# Patient Record
Sex: Male | Born: 1955 | Race: White | Hispanic: No | Marital: Single | Smoking: Never smoker
Health system: Southern US, Community
[De-identification: ages and names within clinical notes are randomized; demographics above are authoritative.]

## PROBLEM LIST (undated history)

## (undated) DIAGNOSIS — I519 Heart disease, unspecified: Secondary | ICD-10-CM

## (undated) DIAGNOSIS — I2510 Atherosclerotic heart disease of native coronary artery without angina pectoris: Secondary | ICD-10-CM

## (undated) DIAGNOSIS — G4730 Sleep apnea, unspecified: Secondary | ICD-10-CM

## (undated) DIAGNOSIS — I6521 Occlusion and stenosis of right carotid artery: Secondary | ICD-10-CM

## (undated) DIAGNOSIS — R011 Cardiac murmur, unspecified: Secondary | ICD-10-CM

## (undated) DIAGNOSIS — G931 Anoxic brain damage, not elsewhere classified: Secondary | ICD-10-CM

## (undated) DIAGNOSIS — Z95810 Presence of automatic (implantable) cardiac defibrillator: Secondary | ICD-10-CM

## (undated) DIAGNOSIS — E785 Hyperlipidemia, unspecified: Secondary | ICD-10-CM

## (undated) DIAGNOSIS — T82198A Other mechanical complication of other cardiac electronic device, initial encounter: Secondary | ICD-10-CM

## (undated) DIAGNOSIS — E039 Hypothyroidism, unspecified: Secondary | ICD-10-CM

## (undated) DIAGNOSIS — Z789 Other specified health status: Secondary | ICD-10-CM

## (undated) DIAGNOSIS — I255 Ischemic cardiomyopathy: Secondary | ICD-10-CM

## (undated) DIAGNOSIS — H9190 Unspecified hearing loss, unspecified ear: Secondary | ICD-10-CM

## (undated) DIAGNOSIS — I214 Non-ST elevation (NSTEMI) myocardial infarction: Secondary | ICD-10-CM

## (undated) DIAGNOSIS — I509 Heart failure, unspecified: Secondary | ICD-10-CM

## (undated) HISTORY — DX: Presence of automatic (implantable) cardiac defibrillator: Z95.810

## (undated) HISTORY — DX: Hyperlipidemia, unspecified: E78.5

## (undated) HISTORY — PX: TONSILLECTOMY: SUR1361

## (undated) HISTORY — DX: Hypothyroidism, unspecified: E03.9

## (undated) HISTORY — PX: CORONARY ARTERY BYPASS GRAFT: SHX141

## (undated) HISTORY — PX: ADENOIDECTOMY: SUR15

## (undated) HISTORY — DX: Heart disease, unspecified: I51.9

## (undated) HISTORY — DX: Other mechanical complication of other cardiac electronic device, initial encounter: T82.198A

## (undated) HISTORY — DX: Anoxic brain damage, not elsewhere classified: G93.1

## (undated) HISTORY — DX: Non-ST elevation (NSTEMI) myocardial infarction: I21.4

## (undated) HISTORY — DX: Occlusion and stenosis of right carotid artery: I65.21

## (undated) HISTORY — PX: CARDIAC DEFIBRILLATOR PLACEMENT: SHX171

## (undated) HISTORY — DX: Sleep apnea, unspecified: G47.30

## (undated) HISTORY — DX: Ischemic cardiomyopathy: I25.5

## (undated) HISTORY — DX: Other specified health status: Z78.9

---

## 2000-07-18 DIAGNOSIS — I214 Non-ST elevation (NSTEMI) myocardial infarction: Secondary | ICD-10-CM

## 2000-07-18 HISTORY — DX: Non-ST elevation (NSTEMI) myocardial infarction: I21.4

## 2000-08-05 ENCOUNTER — Inpatient Hospital Stay (HOSPITAL_COMMUNITY): Admission: EM | Admit: 2000-08-05 | Discharge: 2000-08-24 | Payer: Self-pay | Admitting: Emergency Medicine

## 2000-08-05 ENCOUNTER — Encounter: Payer: Self-pay | Admitting: Emergency Medicine

## 2000-08-05 HISTORY — PX: CARDIAC CATHETERIZATION: SHX172

## 2000-08-09 ENCOUNTER — Encounter: Payer: Self-pay | Admitting: Cardiology

## 2000-08-11 ENCOUNTER — Encounter: Payer: Self-pay | Admitting: Cardiology

## 2000-08-12 ENCOUNTER — Encounter: Payer: Self-pay | Admitting: Thoracic Surgery (Cardiothoracic Vascular Surgery)

## 2000-08-13 ENCOUNTER — Encounter: Payer: Self-pay | Admitting: Thoracic Surgery (Cardiothoracic Vascular Surgery)

## 2000-08-14 ENCOUNTER — Encounter: Payer: Self-pay | Admitting: Thoracic Surgery (Cardiothoracic Vascular Surgery)

## 2000-08-15 ENCOUNTER — Encounter: Payer: Self-pay | Admitting: Thoracic Surgery (Cardiothoracic Vascular Surgery)

## 2000-08-16 ENCOUNTER — Encounter: Payer: Self-pay | Admitting: Thoracic Surgery (Cardiothoracic Vascular Surgery)

## 2000-08-17 ENCOUNTER — Encounter: Payer: Self-pay | Admitting: Thoracic Surgery (Cardiothoracic Vascular Surgery)

## 2000-08-18 ENCOUNTER — Encounter: Payer: Self-pay | Admitting: Thoracic Surgery (Cardiothoracic Vascular Surgery)

## 2000-08-19 ENCOUNTER — Encounter: Payer: Self-pay | Admitting: Thoracic Surgery (Cardiothoracic Vascular Surgery)

## 2000-08-21 ENCOUNTER — Encounter: Payer: Self-pay | Admitting: Cardiothoracic Surgery

## 2000-08-24 ENCOUNTER — Inpatient Hospital Stay: Admission: RE | Admit: 2000-08-24 | Discharge: 2000-08-31 | Payer: Self-pay | Admitting: Internal Medicine

## 2003-09-19 ENCOUNTER — Ambulatory Visit (HOSPITAL_COMMUNITY): Admission: RE | Admit: 2003-09-19 | Discharge: 2003-09-19 | Payer: Self-pay | Admitting: Cardiology

## 2004-10-21 ENCOUNTER — Ambulatory Visit: Payer: Self-pay | Admitting: Internal Medicine

## 2004-12-09 ENCOUNTER — Ambulatory Visit: Payer: Self-pay | Admitting: Internal Medicine

## 2004-12-10 ENCOUNTER — Inpatient Hospital Stay (HOSPITAL_COMMUNITY): Admission: RE | Admit: 2004-12-10 | Discharge: 2004-12-11 | Payer: Self-pay | Admitting: Internal Medicine

## 2004-12-28 ENCOUNTER — Ambulatory Visit: Payer: Self-pay | Admitting: Internal Medicine

## 2004-12-28 ENCOUNTER — Ambulatory Visit: Payer: Self-pay

## 2010-09-28 ENCOUNTER — Encounter: Payer: Self-pay | Admitting: Internal Medicine

## 2010-11-02 ENCOUNTER — Ambulatory Visit: Payer: Self-pay | Admitting: Cardiology

## 2010-11-19 NOTE — Miscellaneous (Signed)
Summary: Device preload  Clinical Lists Changes  Observations: Added new observation of ICDEXPLCOMM: 12/29/07 LIA (09/28/2010 19:19) Added new observation of ICD INDICATN: ICM (09/28/2010 19:19) Added new observation of ICDLEADSTAT1: active (09/28/2010 19:19) Added new observation of ICDLEADSER1: [id number] V (09/28/2010 19:19) Added new observation of ICDLEADMOD1: 9629  (09/28/2010 19:19) Added new observation of ICDLEADDOI1: 12/10/2004  (09/28/2010 19:19) Added new observation of ICDLEADLOC1: RV  (09/28/2010 19:19) Added new observation of ICD IMP MD: Sherryl Manges, MD  (09/28/2010 19:19) Added new observation of ICD IMPL DTE: 12/10/2004  (09/28/2010 19:19) Added new observation of ICD SERL#: [id number] H  (09/28/2010 19:19) Added new observation of ICD MODL#: 7232  (09/28/2010 19:19) Added new observation of ICDMANUFACTR: Medtronic  (09/28/2010 19:19) Added new observation of ICD MD: Sherryl Manges, MD  (09/28/2010 19:19)       ICD Specifications Following MD:  Sherryl Manges, MD     ICD Vendor:  Medtronic     ICD Model Number:  7232     ICD Serial Number:  [id number] ICD DOI:  12/10/2004     ICD Implanting MD:  Sherryl Manges, MD  Lead 1:    Location: RV     DOI: 12/10/2004     Model #: 6440     Serial #: [id number]     Status: active  Indications::  ICM  Explantation Comments: 12/29/07 LIA

## 2011-02-02 ENCOUNTER — Encounter: Payer: Self-pay | Admitting: Internal Medicine

## 2011-02-04 ENCOUNTER — Encounter: Payer: Self-pay | Admitting: Internal Medicine

## 2011-02-10 ENCOUNTER — Encounter: Payer: Self-pay | Admitting: Internal Medicine

## 2011-03-05 NOTE — Procedures (Signed)
[place]. [facility]  Patient:    Adam Gillespie, Adam Gillespie                        MRN: [id number] Proc. Date: 08/12/00 Adm. Date:  [id number] Attending:  Charlett Lango                           Procedure Report  PROCEDURE:  Intraoperative transesophageal echocardiography.  INDICATIONS:  Mr. Yaron Grasse is a 55 year old white male with a history of severe coronary artery disease and severe left ventricular dysfunction.  He is scheduled for coronary artery bypass grafting.  Intraoperative transesophageal echocardiography was requested to evaluate the extent of left ventricular dysfunction and to determine if any valvular pathology was present, and to serve as a monitor of intraoperative volume status.  DESCRIPTION:  The patient was brought to the operating room at [facility] and general anesthesia was induced without difficulty.  The transesophageal echocardiography probe was then inserted into the esophagus without difficulty.  IMPRESSION - PREBYPASS FINDINGS: 1. Severe left ventricular dysfunction.  The left ventricle was dilated and    there was dyskinesis of the apex; akinesis of the anterior wall; mild    dyskinesis of the septum; with hypokinesis of the inferior, posterior,    and lateral walls.  Ejection fraction was estimated at 15-20%.  There    was no thrombus noted in the left ventricular apex. 2. Mitral valve function appeared normal.  There was trace mitral    insufficiency.  The leaflets were mildly thickened, but coapted well    without prolapse or fluttering. 3. Aortic valve leaflets were thickened but otherwise normal.  There was good    opening and no aortic insufficiency noted.  The valve was trileaflet. 4. Left atrium appeared normal in size.  There was no spontaneous echo    contrast and no thrombus noted in the left atrium or left atrial    appendage. 5. Interatrial septum was intact. 6. Right ventricular size was normal with  normal-appearing right ventricular    function. 7. Tricuspid valve appeared normal with trace tricuspid insufficiency.  POST BYPASS FINDINGS: 1. Persistent severe left ventricular dysfunction.  The ejection fraction    appeared somewhat improved due to the fact that an intra-aortic balloon    pump was in place.  However, again there was dyskinesis of the    interventricular septum.  There was dyskinesis of the left ventricular    apex.  There was akinesis of the anterior wall and there was hypokinesis    of the inferior, posterior, and lateral walls.  Again, ejection fraction    was estimated at 20%. 2. The mitral valve, again, appeared unchanged from the prebypass study.    There was trace mitral insufficiency.  The mitral valve appeared normal. 3. Aortic valve leaflets were moderately thickened but aortic valve function,    again, appeared normal. DD:  08/12/00 TD:  08/13/00 Job: [id number]
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
## 2011-03-05 NOTE — Cardiovascular Report (Signed)
[place]. [facility]  Patient:    Adam Gillespie, Adam Gillespie                        MRN: [id number] Proc. Date: 08/05/00 Adm. Date:  [id number] Attending:  Eleanora Neighbor CC:         Hadassah Pais. Jeannetta Nap, M.D.   Cardiac Catheterization  PROCEDURES PERFORMED:  Left heart catheterization with selective coronary angiography and left ventricular angiography.  TYPE AND SITE OF ENTRY:  Percutaneous, right femoral artery.  CATHETERS:  Six [place] #4 curved Judkins right coronary catheter, #3.5 curved Judkins left coronary catheter, and 6-French pigtail ventriculography catheter.  CONTRAST MATERIAL:  Omnipaque.  MEDICATION GIVEN PRIOR TO PROCEDURE:  IV nitroglycerin and aspirin.  MEDICATION GIVEN DURING PROCEDURE:  None.  COMMENTS:  The patient tolerated the procedure well.  HEMODYNAMIC DATA:  The aortic pressure was 119/70, LV pressure 114/35.  There was no aortic valve gradient noted on pullback.  ANGIOGRAPHIC DATA:  LEFT VENTRICULOGRAPHY:  The left ventricular angiogram was performed in the RAO position.  There is anterior and apical akinesis.  The global ejection fraction is estimated to be 20%, with inferobasilar and anterobasilar portions contracting reasonably well.  There does not appear to be any significant mitral regurgitation.  CORONARY ARTERIES:  The coronary arteries arise and distribute normally, but the right coronary artery is small and in the left system there is an early bifurcation.  There are basically four major branches off of the left main.  1.  Right coronary artery:  The right coronary artery is small.  It is a     dominant vessel.  It has an 80% narrowing and there is catheter damping.     The arteries are clearly less than 2 mm in diameter and probably     approximately 1.5 mm in their more proximal locations.  The distal vessels     would be questionable for bypass grafting. 2.  Left main coronary:  There is catheter damping with  entering the left     main. 3.  Left circumflex:  The left circumflex continues along the AV groove.     There is 60% to 70% proximal along the initial portions of this. 4.  Intermediate vessel:  There is an intermediate vessel that continues to     the posterolateral wall and inferolateral wall.  There is approximately     70% narrowing in this proximal portion of this vessel.  This is the     largest of the vessels, particularly distally. 5.  Left anterior descending artery:  The left anterior descending artery is     subtotaled proximally.  It continues as a bifurcating branch.  It probably     would be suitable for bypass grafting, but it has very slow antegrade     flow. 6.  First diagonal branch:  The first diagonal branch arises in a very high     location.  There is a 99% proximal stenosis and very slow antegrade flow.     There is diffuse disease throughout this vessel, but the vessel probably     would be suitable for bypass grafting.  It is diffusely diseased distally.  OVERALL IMPRESSION: 1.  Severe left ventricular dysfunction. 2.  Severe three-vessel coronary artery disease with 80% small right coronary     artery, 99% left anterior descending artery, 99% first diagonal with     diffuse distal disease, 70%+ intermediate, 60% small left  circumflex in AV     groove with a small left main with catheter damping.  DISCUSSION:  He will initially be treated with IV nitroglycerin.  With the slow flow and with the GI bleeding, he is a poor candidate for anticoagulation at this point in time.  We will need to consider him as a candidate for coronary artery bypass grafting, although he is a poor candidate quite frankly for this as well.  He is absolutely not a candidate for percutaneous intervention.  He remains at high risk. DD:  08/05/00 TD:  08/06/00 Job: [id number]

## 2011-03-05 NOTE — Discharge Summary (Signed)
[place]. [facility]  Patient:    Adam Gillespie, Adam Gillespie                        MRN: [id number] Adm. Date:  [id number] Disc. Date: [id number] Attending:  Miguel Aschoff CC:         Hadassah Pais. Jeannetta Nap, M.D.  Colleen Can. Deborah Chalk, M.D.  Salvatore Decent Dorris Fetch, M.D.   Discharge Summary  DATE OF BIRTH:  1955/12/10  DISCHARGE DIAGNOSES:  1. Atherosclerotic coronary artery disease.     a. Ejection fraction 20%.     b. Acute myocardial infarction August 05, 2000.     c. Coronary artery bypass (x 5), August 12, 2000.     d. Profound deconditioning requiring subacute care unit stay.  2. Hyperkalemia on ACE inhibitor.     a. Medication discontinued.  3. Mild renal insufficiency, stable.     a. Discharge BUN 29, creatinine 1.1.  4. Melena on previous acute hospital admission.     a. Required transfusion.     b. Hemoglobin stable.     c. Hemoglobin remains stable.     d. Follow up as an outpatient and consider workup.  5. Mild oropharyngeal dysphagia.     a. Diet advanced to regular with thin liquids this admission.  6. Mild encephalopathy secondary to #1.     a. CT and EEG unremarkable.  7. Hypothyroidism.     a. TSH October 2001 was 12, medication increased.  8. Peripheral vascular disease.     a. 60-80% right internal carotid artery stenosis.  9. Hyperlipidemia. 10. Acute postoperative anemia.     a. Admission hemoglobin 11.2, low of 7, 6 units transfused.     b. All this occurred on acute hospitalization for myocardial infarction.  DISCHARGE MEDICATIONS:  1. Kayexalate 15 g onetime dose.  2. Aspirin 81 mg q.d.  3. Protonix 40 mg q.d.  4. Lipitor 10 mg q.d.  5. Niferex 150 mg p.o. b.i.d.  6. Synthroid 150 mcg q.d.  7. Lasix 20 mg q.d.  8. Lopressor liquid 12.5 mg equals 5 mL b.i.d.  9. Tylenol as needed for pain. 10. Colace as needed for constipation.  ACTIVITY:  As tolerated.  DIET:  Low salt.  Drink plenty of water.  SPECIAL  INSTRUCTIONS:  1. Weigh yourself daily.  If gain or lose more than 5 pounds from the first     home weight, call Dr. Deborah Chalk.  2. Home health will draw basic metabolic panel on Monday with the results to     Dr. Jeannetta Nap.  Physical therapy will also see the patient at home.  FOLLOW-UP:  1. Dr. Jeannetta Nap on Monday, December 10, at 8:30.  At that visit, he will need a     basic metabolic panel, as well as a TSH, checked.  2. The patient is to call Dr. Angelina Pih office to schedule a follow-up in     approximately 3-4 weeks.  CONSULTATIONS:  1. Dr. Deborah Chalk.  2. Dr. Dorris Fetch.  PROCEDURES:  Bedside swallowing evaluation (November 8):  Dysphagia 3 with thin liquids and supervision for chin tuck.  HOSPITAL COURSE: #1 - CORONARY ARTERY DISEASE:  Adam Gillespie suffered an acute non-Q wave MI, followed by bypass surgery last month.  He is stable from a cardiac standpoint.  He has a low ejection fraction but no evidence of congestive heart failure.  We did have to stop his ACE inhibitor secondary to hyperkalemia.  Discharge weight  127.8 pounds.  #2 - HYPERKALEMIA:  Induced by ACE inhibitor.  We did discontinue this medication prior to discharge.  His potassium peaked at 5.3.  We stopped his ACE inhibitor for 24 hours and it decreased to 5.2.  I did give him 15 g of Kayexalate prior to discharge and he will have a basic metabolic panel repeated on November 19.  I suspect it will continue to trend downward.  #3 - MILD RENAL INSUFFICIENCY:  Adam Gillespie BUN and creatinine actually improved during this hospital stay.  His discharge BUN was 29 with a creatinine of 1.2.  Follow up as an outpatient.  #4 - HYPOTHYROIDISM:  The patients TSH was elevated at 12 and his Synthroid was increased.  Repeat in one month per Dr. Jeannetta Nap.  #5 - REACTIVE THROMBOCYTOPENIA:  Stable.  #6 - ANEMIA:  Adam Gillespie did have postoperative hemoglobin drop during his acute stay with bypass surgery.  He is on iron therapy  and his discharge hemoglobin was 12.5.  #7 - MILD ENCEPHALOPATHY:  Adam Gillespie has somewhat slowed mentation but is functioning without difficulty.  CT and an EEG done during acute hospitalization were unremarkable. DD:  08/31/00 TD:  08/31/00 Job: [id number]

## 2011-03-05 NOTE — Op Note (Signed)
NAMEOWAIS, Adam Gillespie NO.:  [id number]   MEDICAL RECORD NO.:  [id number]          PATIENT TYPE:  INP   LOCATION:  2899                         FACILITY:  [facility]   PHYSICIAN:  Duke Salvia, M.D.  DATE OF BIRTH:  25-Aug-1956   DATE OF PROCEDURE:  12/10/2004  DATE OF DISCHARGE:                                 OPERATIVE REPORT   PREOPERATIVE DIAGNOSIS:  Ischemic cardiomyopathy with depressed left  ventricular function, broad QRS.   POSTOPERATIVE DIAGNOSIS:  Ischemic cardiomyopathy with depressed left  ventricular function, broad QRS.   PROCEDURE:  Single chamber defibrillator implantation.   Following obtaining informed consent, the patient was brought to the  electrophysiology laboratory and placed on the fluoroscopy table in supine  position.  After routine prep and drape of the left upper chest, lidocaine  was infiltrated in the prepectoral subclavicular region.  An incision was  made and carried down to the layer of the prepectoral fascia using  electrocautery and sharp dissection.  A pocket was performed similarly,  hemostasis was obtained.   Next, attention was turned to gaining access to the extrathoracic left  subclavian vein which was accomplished without difficulty without the  aspiration of air or puncture of the artery.  A guide-wire was placed and  retained and a 7 French sheath was placed through which was then passed a  Medtronic 6949, 65 cm, dual coil active fixation defibrillator lead, serial  number [id number].  Under fluoroscopic guidance, it was manipulated to the  right ventricular apex where it failed to sense adequately.  We actually put  hockey curve into it to put it on the base of the septum. In this location,  the bipolar R wave was 13.9 millivolts with pacing impedance of 1239 ohms  and a threshold of 1 volt at 0.5 milliseconds, current threshold was 1 MA,  there was no diaphragmatic pacing at 10 volts.  The lead was secured to the  prepectoral fascia and attached to a Medtronic Maximow 7232CX ICD, serial  number [id number].  Through the device, the bipolar R wave was 11 millivolts  with a pacing impedance of 960 ohms and threshold of 1  volts at 0.4  milliseconds, the distal coil impedance was 49 ohms, the proximal coil  impedance was 78 ohms.   With these acceptable parameters recorded, defibrillation threshold testing  was undertaken.  Ventricular defibrillation was induced via the T wave  shock.  After a total duration of 6 seconds, a 15 joule shock was delivered  through a measured resistance of 52 ohms terminating ventricular  fibrillation restoring a previously paced rhythm.  After a wait of 5-6  minutes, ventricular fibrillation was reinduced via the T wave shock.  After  a total duration of 6 seconds, a 15.1 joule shock was delivered through a  measured resistance of 51 ohms terminating ventricular fibrillation and  restoring sinus rhythm.   With these acceptable parameters recorded, this system was implanted.  The  pocket was copiously irrigated with antibiotic containing saline solution.  Hemostasis was assured.  The lead and pulse generator were placed in the  pocket and secured to the prepectoral fascia.  The wound was washed, dried,  and a Benzoin and Steri-Strip dressing was applied.  Needle counts, sponge  counts, and instrument counts were correct at the end of the procedure  according to the staff.  The patient tolerated the procedure without  apparent complications.      SCK/MEDQ  D:  12/10/2004  T:  12/10/2004  Job:  [id number]   cc:   Colleen Can. Deborah Chalk, M.D.  Fax: [phone]
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
## 2011-03-05 NOTE — H&P (Signed)
[place]. [facility]  Patient:    Adam Gillespie, Adam Gillespie                        MRN: [id number] Adm. Date:  [id number] Attending:  Miguel Gillespie CC:         Adam Gillespie. Adam Gillespie, M.D.  Salvatore Decent Dorris Fetch, M.D.  Catherine A. Orlin Hilding, M.D.  Colleen Can. Deborah Chalk, M.D.   History and Physical  DATE OF BIRTH: September 28, 1956  PROBLEM LIST:  1. Atherosclerotic coronary artery disease with severe left ventricular     dysfunction, ejection fraction 20%.     a. Acute myocardial infarction August 05, 2000.     b. Coronary artery bypass grafting x 5 vessels on August 12, 2000.  2. Melena on admission.  3. Deconditioning after prolonged hospitalization.  4. Mild oropharyngeal dysphagia, evaluated July 29, 2000.  5. Mild acute encephalopathy, evaluated August 17, 2000.     a. CT and electroencephalogram unremarkable.  6. Hypothyroidism.  7. Ethmoid, sphenoid sinusitis; asymptomatic; incidental finding by CT.  8. Atherosclerotic peripheral vascular disease, with 60-80% right internal     carotid artery stenosis.  9. Hyperlipidemia, treated. 10. Acute postoperative anemia (hemoglobin 12.2 on admission, 7.0 nadir; six     unit transfusion).  HISTORY OF PRESENT ILLNESS: The patient is a 55 year old man with a strong family history of coronary artery disease, admitted August 05, 2000 to the service of Dr. Roger Gillespie with complaint of chest pain.  He was found to have suffered an acute myocardial infarction, and further investigation revealed that he had multi-vessel disease and marked cardiomyopathy, with an ejection fraction of 20%, requiring CABG.  Adam Gillespie underwent this procedure on August 12, 2000 and experienced a prolonged hospital stay, partly contributed to by delirium and somnolence, which gradually resolved and was felt to be multifactorial.  Adam Gillespie has subsequently done well, and is now prepared for transfer to the subacute care unit  where he will undergo continued restorative therapies with the hope of returning home to his independent environment.  He currently feels well and is ready to go home.  He denies headache, chest pain, shortness of breath, abdominal pain, indigestion, alteration in appetite, limb and joint pain, hold and cold intolerance, but does have a tendency to constipation.  He has not noted melena of late.  FAMILY HISTORY: Strong for coronary disease.  SOCIAL HISTORY: The patient is single and lives next door to his mother in [place].  He is employed at a [employer].  He ceased tobacco smoking many years ago.  He does not drink alcohol.  He apparently suffered a head injury some years ago, and may carry a diagnosis of mild MR.  PAST MEDICAL HISTORY: As above.  TRANSFER MEDICATIONS:  1. Enteric-coated aspirin 81 mg q.d.  2. Lasix 40 mg q.d.  3. Protonix 40 mg q.d.  4. Synthroid 125 mcg q.d.  5. Altace 2.5 mg b.i.d.  6. Lipitor 10 mg q.d.  7. Niferex 150 mg b.i.d.  ALLERGIES: No known drug allergies.  PHYSICAL EXAMINATION:  VITAL SIGNS: Vital signs are pending at the time of discharge.  GENERAL: He appears to be well-developed and well-nourished.  He is somewhat disheveled.  HEENT: Eyes express somewhat of a stare and lid lag, left greater than right. He exhibits some palpebra fluttering.  Pupils are 2 mm and reactive.  There appears to be a mild bit of strabismus.  Peripheral fields are  intact by confrontation.  Oropharynx appears moist.  NECK: There is mild thyroid fullness but no nodularity.  There is no JVD and no carotid bruits by auscultation, although some environmental noise interferes with careful examination.  LUNGS: Clear to auscultation.  HEART: Regular rate and rhythm.  ABDOMEN: Soft, nontender.  No palpable masses.  EXTREMITIES: Lower extremities are hairless.  The right lower extremity is notable for a healing venous harvest site.  The  feet are warm.  Nail beds appear pale.  LABORATORY DATA: Hemoglobin 12.5.  Sodium 136, potassium 5, chloride 101, bicarbonate 26, BUN 47, creatinine 1.5, glucose 98.  TSH 12.  ASSESSMENT/PLAN:  1. Post coronary artery bypass graft deconditioning.  Social workers have     met with Adam Gillespie and I agree with their assessment that he will     benefit from further observation in restorative care to include     education.  He does not read, and conversation directed education will     be important.  2. Atherosclerotic coronary artery disease.  Will continue to observe for     toleration of ACE inhibitor.  Should blood pressure tolerate in the     future it would be desirable to begin beta-blocker.  3. Reported melena on admission.  Hemoglobin has been stable following     transfusion.  If further evidence of recurrence this will need     gastrointestinal work-up.  Will heme check stools.  4. Insufficiently treated hypothyroidism.  Will increase Synthroid by     25 mcg q.d.  5. Mild prerenal insufficiency secondary to decreased fluid intake (the     patient is currently on thick liquids).  Will need to encourage increased     oral intake. DD:  08/24/00 TD:  08/25/00 Job: [id number]

## 2011-03-05 NOTE — Discharge Summary (Signed)
NAMEWESLEY, DUCRE NO.:  [id number]   MEDICAL RECORD NO.:  [id number]          PATIENT TYPE:  INP   LOCATION:  3705                         FACILITY:  [facility]   PHYSICIAN:  Duke Salvia, M.D.  DATE OF BIRTH:  12-05-1955   DATE OF ADMISSION:  12/10/2004  DATE OF DISCHARGE:  12/11/2004                                 DISCHARGE SUMMARY   DISCHARGE DIAGNOSES:  1.  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 Sherryl Manges.  2.  History of acute myocardial infarction October 2001 with subsequent      coronary artery bypass graft surgery x5 in October 2001.  3.  Ischemic cardiomyopathy, ejection fraction 20%.  4.  Wide QRS.   SECONDARY DIAGNOSES:  1.  Hypothyroidism.  2.  Dyslipidemia.  3.  Family history of coronary artery disease.  4.  Extracranial cerebrovascular occlusive disease with right internal      carotid artery stenosis in the range 60-80%.  5.  Gout.   PROCEDURES:  On December 10, 2004 implantation of [employer]tr[employer] [facility]  cardioverter defibrillator with defibrillator threshold testing less than or  equal to 15 joules, Dr. Sherryl Manges practitioner.  The patient has had no  postoperative complications.  Has maintained sinus rhythm.  He is achieving  oxygen saturation of 97% on room air.  He has had no fever and his  cardioverter defibrillator site is healing nicely without swelling or  erythema.   DISCHARGE MEDICATIONS:  1.  Toprol-XL 50 mg daily.  2.  Lipitor 20 mg daily.  3.  Synthroid 150 mcg daily.  4.  Furosemide 20 mg daily.  5.  Enteric-coated aspirin 81 mg daily.  6.  Multivitamin daily.  7.  For pain management at the cardioverter defibrillator pocket, Tylenol      325 mg one to two tablets every four to six hours as needed for pain.   Mobility of the left upper extremity has been described and discussed with  the  patient.   DISCHARGE DIET:  Low sodium, low cholesterol diet.   The patient has been instructed to keep his incision dry for the next seven  days and he is to sponge bathe until Thursday, March 2.   Follow up at [facility], consist of:  ICD clinic Monday, December 28, 2004 at 9:45 in the morning.  He will see [name] in May.  His office will  call with that appointment.   BRIEF HISTORY:  Mr. Crall is a 55 year old male.  He has a complex cardiac  history.  He had myocardial infarction in October 2001 and underwent  coronary artery bypass graft surgery for this.  Postoperative course has  been complicated by encephalopathy.  From a symptomatic point of view, the  patient is doing well and he is not having shortness of breath or chest  pain.  He works vigorously in his garden.  He has rare peripheral edema.  He  has no nocturnal dyspnea or orthopnea.  He had a history of syncope about 30-  40 years ago.  Over the last year, he has had a Cardiolite study in December  2004 and showed an ejection fraction of 22%.  This has been persistent since  bypass surgery.  He has not had an intercurrent ejection fraction  evaluation.  Carotid Dopplers were taken also in December 2004 showing right  internal carotid stenosis of 60-70%.  Medical history is also notable for  gout.  He has had no other surgeries.  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  He works in the garden rather vigorously and is  proud of his efforts.  He does not use tobacco or alcoholic beverages.  The  patient is clearly high risk.  He has qualification for ICD insertion  secondary to MADIT-II criteria.  In the setting of wide QRS, there is also a  possibility that he would benefit from cardiac resynchronization therapy and  whether it might prevent development of heart failure symptoms, although the  patient currently does not have any demonstrable symptoms related to  congestive heart failure.   The patient is to get back with the  electrophysiology service with [name] to indicate what the procedures and  forward progress they would anticipate.   HOSPITAL COURSE:  The patient presents electively February 23 for  cardioverter defibrillator implantation.  Apparently, the patient was not  desirous of participating in the MADIT CRT protocol study and so, therefore,  cardioverter defibrillator only was implanted.  The patient has done well  status post the procedure and goes home with the medications and followup as  dictated.      GM/MEDQ  D:  12/11/2004  T:  12/11/2004  Job:  [id number]   cc:   Colleen Can. Deborah Chalk, M.D.  Fax: [phone]   Windle Guard, M.D.  [address]  [place], [place] [address]  Fax: [phone]

## 2011-03-05 NOTE — H&P (Signed)
NAMEMACLANE, HOLLORAN NO.:  [id number]   MEDICAL RECORD NO.:  [id number]          PATIENT TYPE:  INP   LOCATION:  2899                         FACILITY:  [facility]   PHYSICIAN:  Duke Salvia, M.D.  DATE OF BIRTH:  Jan 16, 1956   DATE OF ADMISSION:  12/10/2004  DATE OF DISCHARGE:                                HISTORY & PHYSICAL   PROCEDURE:  Insertion of ICD by Dr. Sherryl Manges.   PAST MEDICAL HISTORY:  Atherosclerotic coronary artery disease with severe  left ventricular dysfunction, ejection fraction 20%, acute myocardial  infarction August 05, 2000, coronary artery bypass grafting x5 on August 12, 2000, history of mild encephalopathy chronic,  hypothyroidism,  atherosclerotic peripheral vascular disease with 30-80% right internal  carotid artery stenosis, hyperlipidemia.   A 55 year old male with strong family history of coronary artery disease as  stated above, followed by Dr. Berton Mount.  Patient presently alert and  oriented, denies any chest pain, denies any shortness of breath.   PHYSICAL EXAMINATION:  HEENT: Unremarkable, neck veins are flat, negative  carotid bruits.  LUNGS:  Clear.  CARDIOVASCULAR:  Heart sounds S1/S2, regular rate and rhythm with early  systolic murmur.  ABDOMEN:  Soft with bowel sounds.  EXTREMITIES: Distal pulses 2+, no clubbing, cyanosis or edema.  NEUROLOGIC:  Cranial nerves II-XII grossly intact.   LABORATORY DATA:  EKG showing normal sinus rhythm.  Baseline white blood  cell count 9.0, hemoglobin 17, hematocrit 49.5, platelet count 329,000.  PT  12, INR 0.8.  Sodium 136, potassium 5.0, chloride 104, CO2 24, glucose 99,  BUN 19, creatinine 1.4.   PLAN:  Proceed with ICD implantation per Dr. Sherryl Manges.      MB/MEDQ  D:  12/10/2004  T:  12/10/2004  Job:  [id number]

## 2011-03-05 NOTE — Op Note (Signed)
[place]  Patient:    Adam Gillespie, Adam Gillespie                        MRN: [id number] Proc. Date: 08/12/00 Adm. Date:  [id number] Attending:  Charlett Lango CC:         Colleen Can. Deborah Chalk, M.D.   Operative Report  PREOPERATIVE DIAGNOSIS:  Left main and three vessel coronary artery disease, status post MI with severe ischemic cardiomyopathy.  POSTOPERATIVE DIAGNOSIS:  Left main and three vessel coronary artery disease, status post MI with severe ischemic cardiomyopathy.  PROCEDURE:  Median sternotomy, extracorporeal circulation, coronary artery bypass grafting x 5 (LIMA to LAD, saphenuos vein graft to ramus intermedius and obtuse marginal 1, saphenuos vein graft to acute marginal and distal right coronary), placement of intra-aortic balloon pump.  ANESTHESIA:  General anesthesia.  SURGEON:  Salvatore Decent. Dorris Fetch, M.D.  ASSISTANT:  Sherrie George, P.A.  ANESTHESIA:  General anesthesia.  FINDINGS:  Poor quality targets except LAD which was fair.  Left ventricular hypertrophy and dilatation.  Prebypass TEE revealed septal dyskinesis, apical akinesis, and global hypokinesis with an EF of 20%.  Postbypass revealed the same.  Saphenous vein fair quality, mammary artery good quality.  INDICATIONS:  Adam Gillespie is a 55 year old gentleman who presented with complaint of several-day history of chest pain which waxed and waned.  He was felt to have presented late in the course of an acute myocardial infarction. The patient was seen and evaluated by Colleen Can. Deborah Chalk, M.D.  He did rule in for myocardial infarction with enzymes decreasing.  He underwent cardiac catheterization which revealed left main and three vessel coronary artery disease and severe left ventricular dysfunction with an ejection fraction estimated at 20%.  There was no significant mitral regurgitation.  The patient was referred for coronary artery bypass grafting.  The patient was a  high risk candidate for bypass grafting due to his small coronaries, diffuse nature of his coronary artery disease, and severe left ventricular dysfunction.  These issues were discussed in detail with the patient and his family preoperatively. They understood the risks included risk of death, stroke, MI, bleeding, possible need for transfusions, infection, possible need for intra-aortic balloon pump with a risk to limb, ischemia, as well as other organ system dysfunction.  The patient and his family accepted the risks and agreed to proceed.  DESCRIPTION OF PROCEDURE:  [name] was brought to the preoperative holding area on August 12, 2000.  Lines were placed to monitor arterial, central venous, and pulmonary arterial pressure.  EKG leads were placed for continuous telemetry.  The patient was taken to the operating room, anesthetized, and intubated.  Foley catheter was placed.  Intravenous antibiotics were administered.  Transesophageal echocardiography was performed and showed septal dyskinesis, apical akinesis, and global hypokinesis with an ejection fraction estimated at 20%.  There was mild mitral regurgitation.  No other valvular abnormalities.  The chest, abdomen, and legs were then prepped and draped in the usual fashion.  A median sternotomy was performed and the left internal mammary artery was harvested in the standard fashion.  Simultaneously, incision was made in the medial aspect of the right leg and the greater saphenous vein was harvested using standard techniques.  The saphenous vein was of fair quality.  The mammary artery was of good quality.  The patient was fully heparinized prior to dividing the distal end of the mammary artery.  There was good flow through the cut end  of the vessel.  The mammary was placed on papaverin-soaked sponge and placed into the left pleural space.  The pericardium was opened.  There was marked cardiomegaly, biventricular hypertrophy,  and dilatation.  The ascending aorta was inspected and palpated. There was no palpable atherosclerotic disease.  The aorta was cannulated via concentric 2-0 Ethibond nonpledgeted pursestring sutures.  A dual stage venous cannula was placed via pursestring suture in the right atrial appendage.  A retrograde cardioplegia cannula was placed via pursestring suture in the right atrium and directed into the coronary sinus.  Positioning was confirmed with palpation of the catheter within the coronary sinus as well as a wedge pressure tracing with balloon inflation.  A left ventricular vent was placed via a pursestring suture in the right superior pulmonary vein.  An antegrade cardioplegia cannula was placed in the ascending aorta.  The coronary arteries were inspected and were diffusely small and diffusely diseased.  Anastomotic sites were chosen.  The conduits were cut to length.  A foam pad was placed in the pericardium to protect the left phrenic nerve.  A temperature probe was placed in the myocardial septum and a cardioplegia cannula was placed in the ascending aorta.  The aorta was crossclamped.  The left ventricle was emptied via the left ventricular and aortic root vents.  Cardiac arrest was achieved with a combination of cold antegrade and retrograde blood cardioplegia and topical iced saline.  After achieving a complete diastolic arrest initially with antegrade cardioplegia, the remainder of the cardioplegia was administered retrograde to achieve adequate septal cooling.  The following distal anastomoses were performed.  First a reversed saphenuos vein graft was placed sequentially to the acute marginal and distal right coronary.  This saphenous vein was of fair quality. The acute marginal and right coronary were both 1 mm poor quality vessels.  A side-to-side anastomosis was performed to the acute marginal off a side branch of the vein graft and end-to-side anastomosis was performed  to the distal right coronary.  Both the anastomoses were probed proximally and distally with  1 mm probes to ensure patency.  There was adequate flow through the graft. Cardioplegia was administered and there was good hemostasis.  Next a reversed saphenuos vein graft was placed sequentially to the ramus intermedius and first obtuse marginal branch of the left circumflex coronary artery.  Both of these vessels were 1 mm poor quality targets.  A side-to-side anastomosis was performed to the ramus and the end-to-side obtuse marginal 1 both with 7-0 Prolene running sutures.  Again both anastomoses were probed proximally and distally with 1 mm probes to ensure patency.  Again, there was adequate flow through the graft.  Cardioplegia was administered down the vein graft and additional cardioplegia was also given retrograde at this point.  Next the left internal mammary artery was brought through a window in the pericardium anterior to the left phrenic nerve.  The distal end of the mammary artery was spatulated and was anastomosed end-to-side to the distal LAD.  The distal LAD was a 1.5 mm fair quality target, was a much better target than the remaining coronaries.  The anastomosis was performed with a running 8-0 Prolene suture in an end-to-side fashion.  At the completion of the mammary to LAD anastomosis, the Bulldog clamp was removed from the mammary artery, septal rewarming was noted.  Lidocaine was administered.  The aortic crossclamp was removed.  Total crossclamp time was 66 minutes.  Partial occlusion clamp was placed on the ascending aorta.  The vein grafts were cut to length.  The proximal vein graft anastomoses were performed to 4.0 mm punch aortotomies with running 6-0 Prolene sutures.  At the completion of the final proximal anastomosis before tying the suture, the patient was placed in the [place] position.  Air was allowed to vent as the partial clamp was removed.  Suture  then was tied.  Air was aspirated from each of the vein clamps, Bulldog clamps were removed, and flow was restored.  The patient was being rewarmed during the performance of the proximal anastomoses.  All proximal and distal anastomoses were inspected for hemostasis.  Epicardial pacing wires were placed in the right ventricle and right atrium.  When the core temperature reached 37 degrees Celsius, the patient was started on dopamine drip.  He had been loaded with milrinone and also was placed on a milrinone drip.  The patients lungs were inflated, the heart was gradually allowed to fill with blood and he was very slowly weaned from cardiopulmonary bypass.  The patient initially weaned from bypass without difficulty.  He was DDD paced at the time of separation from bypass and the initial total bypass time was 140 minutes.  However, within minutes before the administration of any Protamine, the patient developed progressive left ventricular dysfunction and dilatation, pulmonary hypertension, and resultant right heart failure.  The systemic pressure dropped and the patient was immediately placed back on cardiopulmonary bypass.  The heart was emptied and allowed to rest.  The dopamine and milrinone drips were increased and an epinephrine drip was added. After 20 minutes, a second attempt was made to wean from bypass again with the same results.  Again, the patient was rapidly placed back on full cardiopulmonary bypass.  A left femoral A-line had been placed prior to beginning the procedure and a wire was passed through the A-line.  The tract was dilated over the wire and a sheath for entry of the balloon pump was placed in the left femoral artery.  The intra-aortic balloon pump then was placed, a 30 cc balloon was used.  A third attempt was made to wean from bypass and again the patient initially did well.  This was with 1 to 1 intra-aortic balloon pump support as well as epinephrine,  dopamine, and milrinone drips.  However, difficulties with the timing of the balloon were encountered and again the patient required reinstitution of cardiopulmonary bypass.  The heart was allowed to rest for 30 minutes and then a fourth try was performed again with 1 to 1 intra-aortic balloon pump, epinephrine, milrinone, and dopamine drips.  On this attempt, the patient weaned from bypass. There was good timing of the balloon.  The initial cardiac index was greater than 2 l/min/sq m.  The patient then remained stable on this inotropic support throughout the remainder of the procedure.  Total cardiopulmonary bypass time for the entire procedure was 208 minutes.  A test dose of protamine was administered.  There was no hemodynamic change.  The remainder of the protamine was administered without incident.  The atrial and aortic cannuli were removed.  The left ventricular vent and retrograde cardioplegia cannula had been removed prior to weaning from bypass.  There was good hemostasis at all cannulation sites.  Additional doses of antibiotics was given and the chest was irrigated with 1 liter of warm normal saline containing 1 gram of Vancomycin.  No attempt was made to close the pericardium.  Left pleural and two mediastinal chest tubes were placed through separate  subcostal incisions and secured with #1 silk sutures.  The sternum was closed with heavy gauge interrupted stainless steel wires.  The patient tolerated closure of the sternum without any hemodynamic compromise.  The pectoralis fascia was closed with a running #1 Vicryl suture.  The subcutaneous tissue was closed with a running 2-0 Vicryl suture.  The skin was closed in standard fashion.  Postbypass transesophageal echocardiography revealed septal dyskinesis, apical akinesis, and continued global hypokinesis with severe left ventricular dysfunction.  All sponge, needle, and instrument counts were correct at the end of the  procedure.  The patient was taken from the operating room to the surgical intensive care unit intubated and in critical condition. DD:  08/16/00 TD:  08/16/00 Job: [id number]

## 2011-03-26 ENCOUNTER — Encounter: Payer: Self-pay | Admitting: Cardiology

## 2011-03-30 ENCOUNTER — Encounter: Payer: Self-pay | Admitting: Cardiology

## 2011-03-30 ENCOUNTER — Ambulatory Visit (INDEPENDENT_AMBULATORY_CARE_PROVIDER_SITE_OTHER): Payer: Medicare Other | Admitting: Cardiology

## 2011-03-30 ENCOUNTER — Other Ambulatory Visit: Payer: Self-pay | Admitting: *Deleted

## 2011-03-30 DIAGNOSIS — E039 Hypothyroidism, unspecified: Secondary | ICD-10-CM

## 2011-03-30 DIAGNOSIS — M109 Gout, unspecified: Secondary | ICD-10-CM | POA: Insufficient documentation

## 2011-03-30 DIAGNOSIS — E785 Hyperlipidemia, unspecified: Secondary | ICD-10-CM

## 2011-03-30 DIAGNOSIS — I255 Ischemic cardiomyopathy: Secondary | ICD-10-CM

## 2011-03-30 DIAGNOSIS — R0989 Other specified symptoms and signs involving the circulatory and respiratory systems: Secondary | ICD-10-CM

## 2011-03-30 DIAGNOSIS — I2589 Other forms of chronic ischemic heart disease: Secondary | ICD-10-CM

## 2011-03-30 MED ORDER — ALLOPURINOL 100 MG PO TABS: 100.0000 mg | ORAL_TABLET | Freq: Every day | ORAL | Status: DC

## 2011-03-30 MED ORDER — LEVOTHYROXINE SODIUM 100 MCG PO TABS: 100.0000 ug | ORAL_TABLET | Freq: Every day | ORAL | Status: DC

## 2011-03-30 MED ORDER — COLCHICINE 0.6 MG PO TABS: 0.6000 mg | ORAL_TABLET | ORAL | Status: DC | PRN

## 2011-03-30 MED ORDER — OMEPRAZOLE MAGNESIUM 20 MG PO TBEC: 20.0000 mg | DELAYED_RELEASE_TABLET | Freq: Every day | ORAL | Status: DC

## 2011-03-30 MED ORDER — METOPROLOL SUCCINATE ER 50 MG PO TB24: 50.0000 mg | ORAL_TABLET | Freq: Every day | ORAL | Status: DC

## 2011-03-30 NOTE — Progress Notes (Signed)
Subjective:   Adam Gillespie comes in the office today for followup visit. In general, he doesn't have a specific complaint. He is raising a garden and lives with his mother. He is on full disability. 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 Overall, he continues to do well from a cardiac standpoint.  He a non-Q-wave myocardial infarction in October 2001 that led to coronary artery bypass grafting x5. He had an ICD placed in his followup with [name].  Current Outpatient Prescriptions  Medication Sig Dispense Refill  . aspirin 81 MG tablet Take 81 mg by mouth daily.        . fish oil-omega-3 fatty acids 1000 MG capsule Take by mouth daily.        . furosemide (LASIX) 20 MG tablet Take 20 mg by mouth daily.       . simvastatin (ZOCOR) 40 MG tablet Take 40 mg by mouth at bedtime.        Marland Kitchen DISCONTD: allopurinol (ZYLOPRIM) 100 MG tablet Take 100 mg by mouth daily.        Marland Kitchen DISCONTD: colchicine 0.6 MG tablet Take 0.6 mg by mouth as needed.        Marland Kitchen DISCONTD: levothyroxine (SYNTHROID, LEVOTHROID) 100 MCG tablet Take 100 mcg by mouth daily.        Marland Kitchen DISCONTD: metoprolol (TOPROL-XL) 50 MG 24 hr tablet Take 50 mg by mouth daily.        Marland Kitchen DISCONTD: omeprazole (PRILOSEC OTC) 20 MG tablet Take 20 mg by mouth daily.        Marland Kitchen allopurinol (ZYLOPRIM) 100 MG tablet Take 1 tablet (100 mg total) by mouth daily.  90 tablet  3  . colchicine 0.6 MG tablet Take 1 tablet (0.6 mg total) by mouth as needed.  90 tablet  3  . levothyroxine (SYNTHROID, LEVOTHROID) 100 MCG tablet Take 1 tablet (100 mcg total) by mouth daily.  90 tablet  3  . metoprolol (TOPROL-XL) 50 MG 24 hr tablet Take 1 tablet (50 mg total) by mouth daily.  90 tablet  3  . Multiple Vitamin (MULTIVITAMIN) tablet Take 1 tablet by mouth daily. ( NOT TAKING )      . omeprazole (PRILOSEC OTC) 20 MG tablet Take 1 tablet (20 mg total) by mouth daily.  90 tablet  3    No Known  Allergies  There is no problem list on file for this patient.   History  Smoking status  . Never Smoker   Smokeless tobacco  . Current User  . Types: Chew    History  Alcohol Use No    Family History  Problem Relation Age of Onset  . Heart attack Mother   . Cancer Father     Review of Systems:   The patient denies any heat or cold intolerance.  No weight gain or weight loss.  The patient denies headaches or blurry vision.  There is no cough or sputum production.  The patient denies dizziness.  There is no hematuria or hematochezia.  The patient denies any muscle aches or arthritis.  The patient denies any rash.  The patient denies frequent falling or instability.  There is no history of depression or anxiety.  All other systems were reviewed and are negative.   Physical Exam:   Weight is 152. Blood pressure 104/60 sitting, heart rate 74.The head is normocephalic and atraumatic.  Pupils are equally round and reactive to  light.  Sclerae nonicteric.  Conjunctiva is clear.  Oropharynx is unremarkable.  There's adequate oral airway.  Neck is supple there are no masses.  Thyroid is not enlarged.  There is no lymphadenopathy.  Lungs are clear.  Chest is symmetric.  Heart shows a regular rate and rhythm.  S1 and S2 are normal.  There is no murmur click or gallop.  Abdomen is soft normal bowel sounds.  There is no organomegaly.  Genital and rectal deferred.  Extremities are without edema.  Peripheral pulses are adequate.  Neurologically intact.  Full range of motion.  The patient is not depressed.  Skin is warm and dry.  Assessment / Plan:

## 2011-04-23 ENCOUNTER — Encounter: Payer: Self-pay | Admitting: Internal Medicine

## 2011-04-23 ENCOUNTER — Ambulatory Visit (INDEPENDENT_AMBULATORY_CARE_PROVIDER_SITE_OTHER): Payer: Medicare Other | Admitting: Internal Medicine

## 2011-04-23 VITALS — BP 116/76 | HR 75 | Resp 16 | Ht 64.0 in | Wt 134.0 lb

## 2011-04-23 DIAGNOSIS — I255 Ischemic cardiomyopathy: Secondary | ICD-10-CM

## 2011-04-23 DIAGNOSIS — Z95810 Presence of automatic (implantable) cardiac defibrillator: Secondary | ICD-10-CM

## 2011-04-23 DIAGNOSIS — I2589 Other forms of chronic ischemic heart disease: Secondary | ICD-10-CM

## 2011-04-23 DIAGNOSIS — I5022 Chronic systolic (congestive) heart failure: Secondary | ICD-10-CM | POA: Insufficient documentation

## 2011-04-23 HISTORY — DX: Presence of automatic (implantable) cardiac defibrillator: Z95.810

## 2011-04-23 NOTE — Patient Instructions (Signed)
Your physician has recommended you make the following change in your medication:  1) take lasix (furosemide) 20 mg one tablet by mouth daily as needed.  Your physician recommends that you schedule a follow-up appointment in: 3 months with Kristin/Paula for a device check.

## 2011-04-23 NOTE — Progress Notes (Signed)
  HPI  Adam Gillespie is a 55 y.o. male With a history of ischemic heart prior bypass surgery x5 in 2001 with an ICD implanted 2006 with ejection fraction of 20%. He has not been seen by [place] since.   Apparently he had a Myoview about a year ago that was okay. He denies chest pain shortness of breath nocturnal dyspnea. He's had no syncope. He has occasional peripheral edema.  Blood work last week demonstrated that he was "dehydrated" that his kidneys are not functioning quite right.  Past Medical History  Diagnosis Date  . 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 Anoxic encephalopathy     POSTOP  . LV dysfunction   . Carotid stenosis, right   . Hyperlipidemia   . Gout   . Hypothyroidism   . Ischemic cardiomyopathy     Past Surgical History  Procedure Date  . Cardiac catheterization 08/05/2000    THERE IS ANTERIOR AND APICAL AKINESIS. EF IS ESTIMATED 20%, WITH INFEROBASILAR PORTIONS CONTRACTING REASONABLY WELL.  Marland Kitchen Coronary artery bypass graft     x5. lima to the lad, saphenous vein graft to the intermediate and obtuse mariginal, and a saphenous vein graft to the acute mariginal and distal right coronary artery  . Cardiac defibrillator placement     Current Outpatient Prescriptions  Medication Sig Dispense Refill  . allopurinol (ZYLOPRIM) 100 MG tablet Take 1 tablet (100 mg total) by mouth daily.  90 tablet  3  . aspirin 81 MG tablet Take 81 mg by mouth daily.        . colchicine 0.6 MG tablet Take 1 tablet (0.6 mg total) by mouth as needed.  90 tablet  3  . fish oil-omega-3 fatty acids 1000 MG capsule Take by mouth daily.        . furosemide (LASIX) 20 MG tablet Take 20 mg by mouth daily.       Marland Kitchen levothyroxine (SYNTHROID, LEVOTHROID) 100 MCG tablet Take 1 tablet (100 mcg total) by mouth daily.  90 tablet  3  . metoprolol (TOPROL-XL) 50 MG 24 hr tablet Take 1 tablet (50 mg total) by mouth daily.  90 tablet  3  . Multiple Vitamin (MULTIVITAMIN) tablet Take 1 tablet by mouth  daily. ( NOT TAKING )      . omeprazole (PRILOSEC OTC) 20 MG tablet Take 1 tablet (20 mg total) by mouth daily.  90 tablet  3  . simvastatin (ZOCOR) 40 MG tablet Take 40 mg by mouth at bedtime.          No Known Allergies  Review of Systems negative except from HPI and PMH history of cataracts and weight loss despite maintained appetite  Physical Exam Well developed and well nourished in no acute distress HENT normal E scleral and icterus clear Neck Supple JVP flat; carotids brisk and full Clear to ausculation Regular rate and rhythm, no murmurs gallops or rub Soft with active bowel sounds No clubbing cyanosis and edema Alert and oriented, grossly normal motor and sensory function Skin Warm and Dry   Assessment and  Plan

## 2011-04-23 NOTE — Assessment & Plan Note (Signed)
Clinically stable. I am not sure what to make of the comment about his kidney function. He should be on an ACE inhibitor unless contraindicated. We will have to look into the old records. For right now I haven't take his diuretic p.r.n. As he has no symptoms of congestion

## 2011-04-23 NOTE — Assessment & Plan Note (Signed)
The patient's device was interrogated.  The information was reviewed. No changes were made in the programming.    

## 2011-04-23 NOTE — Assessment & Plan Note (Signed)
As above.

## 2011-07-26 ENCOUNTER — Ambulatory Visit (INDEPENDENT_AMBULATORY_CARE_PROVIDER_SITE_OTHER): Payer: Medicare Other | Admitting: *Deleted

## 2011-07-26 ENCOUNTER — Encounter: Payer: Self-pay | Admitting: Internal Medicine

## 2011-07-26 DIAGNOSIS — I428 Other cardiomyopathies: Secondary | ICD-10-CM

## 2011-09-30 ENCOUNTER — Ambulatory Visit (INDEPENDENT_AMBULATORY_CARE_PROVIDER_SITE_OTHER): Payer: Medicare Other | Admitting: Cardiology

## 2011-09-30 ENCOUNTER — Encounter: Payer: Self-pay | Admitting: Cardiology

## 2011-09-30 ENCOUNTER — Ambulatory Visit: Payer: Medicare Other | Admitting: Cardiology

## 2011-09-30 DIAGNOSIS — Z79899 Other long term (current) drug therapy: Secondary | ICD-10-CM

## 2011-09-30 DIAGNOSIS — I6529 Occlusion and stenosis of unspecified carotid artery: Secondary | ICD-10-CM

## 2011-09-30 DIAGNOSIS — I255 Ischemic cardiomyopathy: Secondary | ICD-10-CM

## 2011-09-30 DIAGNOSIS — E785 Hyperlipidemia, unspecified: Secondary | ICD-10-CM

## 2011-09-30 DIAGNOSIS — E7800 Pure hypercholesterolemia, unspecified: Secondary | ICD-10-CM

## 2011-09-30 DIAGNOSIS — R0989 Other specified symptoms and signs involving the circulatory and respiratory systems: Secondary | ICD-10-CM

## 2011-09-30 DIAGNOSIS — I2510 Atherosclerotic heart disease of native coronary artery without angina pectoris: Secondary | ICD-10-CM

## 2011-09-30 NOTE — Patient Instructions (Signed)
The current medical regimen is effective;  continue present plan and medications.  Your physician has requested that you have a carotid duplex. This test is an ultrasound of the carotid arteries in your neck. It looks at blood flow through these arteries that supply the brain with blood. Allow one hour for this exam. There are no restrictions or special instructions.  Your physician recommends that you return for a FASTING lipid and liver profile: same day as your carotid doppler.  Follow up with Dr Antoine Poche in 3 months.

## 2011-09-30 NOTE — Progress Notes (Signed)
HPI The patient presents for evaluation of known CAD.  He was previously seen by Dr. Deborah Chalk.  Since last being seen he has done well. The patient denies any new symptoms such as chest discomfort, neck or arm discomfort. There has been no new shortness of breath, PND or orthopnea. There have been no reported palpitations, presyncope or syncope.  He does do some gardening.  With this he has none of his previous cardiovascular symptoms.  No Known Allergies  Current Outpatient Prescriptions  Medication Sig Dispense Refill  . allopurinol (ZYLOPRIM) 100 MG tablet Take 1 tablet (100 mg total) by mouth daily.  90 tablet  3  . aspirin 81 MG tablet Take 81 mg by mouth daily.        . colchicine 0.6 MG tablet Take 1 tablet (0.6 mg total) by mouth as needed.  90 tablet  3  . fish oil-omega-3 fatty acids 1000 MG capsule Take by mouth daily.        . furosemide (LASIX) 20 MG tablet 20 mg every other day. Take one tablet by mouth daily as needed.      Marland Kitchen levothyroxine (SYNTHROID, LEVOTHROID) 125 MCG tablet Take 125 mcg by mouth daily.        . metoprolol (TOPROL-XL) 50 MG 24 hr tablet Take 1 tablet (50 mg total) by mouth daily.  90 tablet  3  . Multiple Vitamin (MULTIVITAMIN) tablet Take 1 tablet by mouth daily. ( NOT TAKING )      . omeprazole (PRILOSEC OTC) 20 MG tablet Take 1 tablet (20 mg total) by mouth daily.  90 tablet  3  . simvastatin (ZOCOR) 40 MG tablet Take 40 mg by mouth at bedtime.          Past Medical History  Diagnosis Date  . 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 Anoxic encephalopathy     POSTOP  . LV dysfunction   . Carotid stenosis, right   . Hyperlipidemia   . Gout   . Hypothyroidism   . Ischemic cardiomyopathy     Past Surgical History  Procedure Date  . Cardiac catheterization 08/05/2000    THERE IS ANTERIOR AND APICAL AKINESIS. EF IS ESTIMATED 20%, WITH INFEROBASILAR PORTIONS CONTRACTING REASONABLY WELL.  Marland Kitchen Coronary artery bypass graft     x5. lima to the lad,  saphenous vein graft to the intermediate and obtuse mariginal, and a saphenous vein graft to the acute mariginal and distal right coronary artery  . Cardiac defibrillator placement     ROS:  As stated in the HPI and negative for all other systems.  PHYSICAL EXAM BP 128/77  Pulse 72  Ht 5\' 4"  (1.626 m)  Wt 154 lb (69.854 kg)  BMI 26.43 kg/m2 GENERAL:  Well appearing HEENT:  Pupils equal round and reactive, fundi not visualized, oral mucosa unremarkable NECK:  No jugular venous distention, waveform within normal limits, carotid upstroke brisk and symmetric, no bruits, no thyromegaly LYMPHATICS:  No cervical, inguinal adenopathy LUNGS:  Clear to auscultation bilaterally BACK:  No CVA tenderness CHEST:  Well healed sternotomy scar, ICD pocket well healed HEART:  PMI not displaced or sustained,S1 and S2 within normal limits, no S3, no S4, no clicks, no rubs, no murmurs ABD:  Flat, positive bowel sounds normal in frequency in pitch, no bruits, no rebound, no guarding, no midline pulsatile mass, no hepatomegaly, no splenomegaly EXT:  2 plus pulses throughout, no edema, no cyanosis no clubbing SKIN:  No rashes no nodules NEURO:  Cranial nerves II through XII grossly intact, motor grossly intact throughout [facility]:  Cognitively intact, oriented to person place and time  EKG:  Sinus rhythm, left bundle branch block, left axis deviation rate 61 09/30/2011   ASSESSMENT AND PLAN

## 2011-10-01 DIAGNOSIS — I2510 Atherosclerotic heart disease of native coronary artery without angina pectoris: Secondary | ICD-10-CM | POA: Insufficient documentation

## 2011-10-01 NOTE — Assessment & Plan Note (Signed)
The patient has no new sypmtoms.  No further cardiovascular testing is indicated.  We will continue with aggressive risk reduction and meds as listed.  

## 2011-10-01 NOTE — Assessment & Plan Note (Signed)
I will schedule follow up carotid Doppler.

## 2011-10-01 NOTE — Assessment & Plan Note (Signed)
I will check a fasting lipid profile.  The goal will be an LDL less than 70 and HDL greater than 40. 

## 2011-10-01 NOTE — Assessment & Plan Note (Signed)
He seems to be euvolemic.  At this point, no change in therapy is indicated.  We have reviewed salt and fluid restrictions.  No further cardiovascular testing is indicated.   

## 2011-10-27 ENCOUNTER — Encounter: Payer: Self-pay | Admitting: Internal Medicine

## 2011-10-27 ENCOUNTER — Ambulatory Visit (INDEPENDENT_AMBULATORY_CARE_PROVIDER_SITE_OTHER): Payer: Medicare Other | Admitting: *Deleted

## 2011-10-27 DIAGNOSIS — I2589 Other forms of chronic ischemic heart disease: Secondary | ICD-10-CM

## 2011-10-27 DIAGNOSIS — I5022 Chronic systolic (congestive) heart failure: Secondary | ICD-10-CM

## 2011-10-27 DIAGNOSIS — I509 Heart failure, unspecified: Secondary | ICD-10-CM

## 2011-10-27 DIAGNOSIS — I255 Ischemic cardiomyopathy: Secondary | ICD-10-CM

## 2011-10-27 LAB — ICD DEVICE OBSERVATION
BATTERY VOLTAGE: 2.97 V
FVT: 0
PACEART VT: 0
RV LEAD IMPEDENCE ICD: 568 Ohm
RV LEAD THRESHOLD: 1 V
TZAT-0001SLOWVT: 1
TZAT-0001SLOWVT: 2
TZAT-0004FASTVT: 8
TZAT-0005SLOWVT: 88 pct
TZAT-0005SLOWVT: 91 pct
TZAT-0012FASTVT: 200 ms
TZAT-0018SLOWVT: NEGATIVE
TZAT-0018SLOWVT: NEGATIVE
TZAT-0019FASTVT: 8 V
TZAT-0019SLOWVT: 8 V
TZAT-0020FASTVT: 1.6 ms
TZON-0003FASTVT: 240 ms
TZON-0005SLOWVT: 12
TZON-0008FASTVT: 0 ms
TZST-0001FASTVT: 4
TZST-0001FASTVT: 6
TZST-0001SLOWVT: 3
TZST-0003FASTVT: 35 J
TZST-0003FASTVT: 35 J
TZST-0003FASTVT: 35 J
TZST-0003SLOWVT: 25 J
TZST-0003SLOWVT: 35 J

## 2011-10-27 NOTE — Progress Notes (Signed)
ICD check 

## 2011-11-01 ENCOUNTER — Encounter (INDEPENDENT_AMBULATORY_CARE_PROVIDER_SITE_OTHER): Payer: Medicare Other | Admitting: *Deleted

## 2011-11-01 ENCOUNTER — Other Ambulatory Visit (INDEPENDENT_AMBULATORY_CARE_PROVIDER_SITE_OTHER): Payer: Medicare Other | Admitting: *Deleted

## 2011-11-01 DIAGNOSIS — I6529 Occlusion and stenosis of unspecified carotid artery: Secondary | ICD-10-CM

## 2011-11-01 DIAGNOSIS — E7800 Pure hypercholesterolemia, unspecified: Secondary | ICD-10-CM

## 2011-11-01 DIAGNOSIS — R0989 Other specified symptoms and signs involving the circulatory and respiratory systems: Secondary | ICD-10-CM

## 2011-11-01 DIAGNOSIS — Z79899 Other long term (current) drug therapy: Secondary | ICD-10-CM

## 2011-11-01 LAB — HEPATIC FUNCTION PANEL
ALT: 23 U/L (ref 0–53)
AST: 22 U/L (ref 0–37)
Albumin: 3.9 g/dL (ref 3.5–5.2)
Total Protein: 6.8 g/dL (ref 6.0–8.3)

## 2011-11-01 LAB — LIPID PANEL: Cholesterol: 143 mg/dL (ref 0–200)

## 2011-12-31 ENCOUNTER — Ambulatory Visit: Payer: Medicare Other | Admitting: Cardiology

## 2012-01-26 ENCOUNTER — Encounter: Payer: Self-pay | Admitting: Internal Medicine

## 2012-01-26 ENCOUNTER — Ambulatory Visit (INDEPENDENT_AMBULATORY_CARE_PROVIDER_SITE_OTHER): Payer: Medicare Other | Admitting: *Deleted

## 2012-01-26 DIAGNOSIS — I5022 Chronic systolic (congestive) heart failure: Secondary | ICD-10-CM

## 2012-01-26 DIAGNOSIS — Z95810 Presence of automatic (implantable) cardiac defibrillator: Secondary | ICD-10-CM

## 2012-01-26 DIAGNOSIS — I509 Heart failure, unspecified: Secondary | ICD-10-CM

## 2012-01-26 LAB — ICD DEVICE OBSERVATION
BATTERY VOLTAGE: 2.93 V
DEV-0020ICD: NEGATIVE
RV LEAD IMPEDENCE ICD: 528 Ohm
TZAT-0001SLOWVT: 2
TZAT-0004FASTVT: 8
TZAT-0005SLOWVT: 88 pct
TZAT-0005SLOWVT: 91 pct
TZAT-0011SLOWVT: 10 ms
TZAT-0011SLOWVT: 10 ms
TZAT-0012FASTVT: 200 ms
TZAT-0019SLOWVT: 8 V
TZAT-0020FASTVT: 1.6 ms
TZON-0003FASTVT: 240 ms
TZST-0001FASTVT: 2
TZST-0001FASTVT: 4
TZST-0001SLOWVT: 3
TZST-0001SLOWVT: 4
TZST-0003FASTVT: 35 J
TZST-0003FASTVT: 35 J
TZST-0003FASTVT: 35 J
TZST-0003SLOWVT: 25 J
TZST-0003SLOWVT: 35 J

## 2012-01-26 NOTE — Progress Notes (Signed)
Pt seen in clinic for follow up of ICD.  No complaints of chest pain, shortness of breath, dizziness, palpitations, or shocks.  Device functioning normally at this time.  For full details, see Pace[employer] report.  No programming changes made today.  Plan to follow up in 3 months with Dr Graciela Husbands.   [name], RN, BSN 01/26/2012 10:11 AM

## 2012-04-14 ENCOUNTER — Ambulatory Visit (INDEPENDENT_AMBULATORY_CARE_PROVIDER_SITE_OTHER): Payer: Medicare Other | Admitting: Cardiology

## 2012-04-14 ENCOUNTER — Encounter: Payer: Self-pay | Admitting: Cardiology

## 2012-04-14 VITALS — BP 111/70 | HR 70 | Ht 64.0 in | Wt 149.8 lb

## 2012-04-14 DIAGNOSIS — I2510 Atherosclerotic heart disease of native coronary artery without angina pectoris: Secondary | ICD-10-CM

## 2012-04-14 DIAGNOSIS — E785 Hyperlipidemia, unspecified: Secondary | ICD-10-CM

## 2012-04-14 DIAGNOSIS — R0989 Other specified symptoms and signs involving the circulatory and respiratory systems: Secondary | ICD-10-CM

## 2012-04-14 NOTE — Progress Notes (Signed)
   HPI The patient presents for evaluation of known CAD.  Since I last saw him he has done well.  The patient denies any new symptoms such as chest discomfort, neck or arm discomfort. There has been no new shortness of breath, PND or orthopnea. There have been no reported palpitations, presyncope or syncope.  He works a Paramedic.    No Known Allergies  Current Outpatient Prescriptions  Medication Sig Dispense Refill  . allopurinol (ZYLOPRIM) 100 MG tablet Take 1 tablet (100 mg total) by mouth daily.  90 tablet  3  . aspirin 81 MG tablet Take 81 mg by mouth daily.        . fish oil-omega-3 fatty acids 1000 MG capsule Take by mouth daily.        Marland Kitchen levothyroxine (SYNTHROID, LEVOTHROID) 125 MCG tablet Take 125 mcg by mouth daily.        . metoprolol (TOPROL-XL) 50 MG 24 hr tablet Take 1 tablet (50 mg total) by mouth daily.  90 tablet  3  . omeprazole (PRILOSEC OTC) 20 MG tablet Take 1 tablet (20 mg total) by mouth daily.  90 tablet  3  . simvastatin (ZOCOR) 40 MG tablet Take 40 mg by mouth at bedtime.          Past Medical History  Diagnosis Date  . 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 Anoxic encephalopathy     POSTOP  . LV dysfunction   . Carotid stenosis, right   . Hyperlipidemia   . Gout   . Hypothyroidism   . Ischemic cardiomyopathy     Past Surgical History  Procedure Date  . Cardiac catheterization 08/05/2000    THERE IS ANTERIOR AND APICAL AKINESIS. EF IS ESTIMATED 20%, WITH INFEROBASILAR PORTIONS CONTRACTING REASONABLY WELL.  Marland Kitchen Coronary artery bypass graft     x5. lima to the lad, saphenous vein graft to the intermediate and obtuse mariginal, and a saphenous vein graft to the acute mariginal and distal right coronary artery  . Cardiac defibrillator placement     ROS:  As stated in the HPI and negative for all other systems.  PHYSICAL EXAM BP 111/70  Pulse 70  Ht 5\' 4"  (1.626 m)  Wt 149 lb 12.8 oz (67.949 kg)  BMI 25.71 kg/m2 GENERAL:  Well  appearing NECK:  No jugular venous distention, waveform within normal limits, carotid upstroke brisk and symmetric, no bruits, no thyromegaly LYMPHATICS:  No cervical, inguinal adenopathy LUNGS:  Clear to auscultation bilaterally BACK:  No CVA tenderness CHEST:  Well healed sternotomy scar, ICD pocket well healed HEART:  PMI not displaced or sustained,S1 and S2 within normal limits, no S3, no S4, no clicks, no rubs, no murmurs ABD:  Flat, positive bowel sounds normal in frequency in pitch, no bruits, no rebound, no guarding, no midline pulsatile mass, no hepatomegaly, no splenomegaly EXT:  2 plus pulses throughout, no edema, no cyanosis no clubbing  EKG:  Sinus rhythm, rate 66, left bundle branch block, right axis deviation.  No change from previous.  04/14/2012  ASSESSMENT AND PLAN

## 2012-04-14 NOTE — Assessment & Plan Note (Signed)
His lipid profile in January was excellent. The total him that there was no clinical benefit to fish oil supplements and he will stop this.

## 2012-04-14 NOTE — Assessment & Plan Note (Signed)
He seems to be euvolemic.  At this point, no change in therapy is indicated.   No further cardiovascular testing is indicated.

## 2012-04-14 NOTE — Patient Instructions (Addendum)
Please stop your fish oil Continue all other medications as listed  Follow up in 6 months with [name].  You will receive a letter in the mail 2 months before you are due.  Please call us when you receive this letter to schedule your follow up appointment.

## 2012-04-14 NOTE — Assessment & Plan Note (Signed)
The patient has no new sypmtoms.  No further cardiovascular testing is indicated.  We will continue with aggressive risk reduction and meds as listed.  

## 2012-04-14 NOTE — Assessment & Plan Note (Addendum)
He is to have 6 month followup of his carotid stenosis in July was 6.

## 2012-04-25 ENCOUNTER — Encounter: Payer: Self-pay | Admitting: Internal Medicine

## 2012-04-25 ENCOUNTER — Ambulatory Visit (INDEPENDENT_AMBULATORY_CARE_PROVIDER_SITE_OTHER): Payer: Medicare Other | Admitting: Internal Medicine

## 2012-04-25 VITALS — BP 130/70 | HR 74 | Resp 19 | Ht 64.0 in | Wt 149.2 lb

## 2012-04-25 DIAGNOSIS — I509 Heart failure, unspecified: Secondary | ICD-10-CM

## 2012-04-25 DIAGNOSIS — I5022 Chronic systolic (congestive) heart failure: Secondary | ICD-10-CM

## 2012-04-25 DIAGNOSIS — I2589 Other forms of chronic ischemic heart disease: Secondary | ICD-10-CM

## 2012-04-25 DIAGNOSIS — I255 Ischemic cardiomyopathy: Secondary | ICD-10-CM

## 2012-04-25 DIAGNOSIS — Z95810 Presence of automatic (implantable) cardiac defibrillator: Secondary | ICD-10-CM

## 2012-04-25 LAB — ICD DEVICE OBSERVATION
CHARGE TIME: 9.27 s
FVT: 0
PACEART VT: 0
RV LEAD AMPLITUDE: 10.4 mv
TZAT-0001SLOWVT: 1
TZAT-0001SLOWVT: 2
TZAT-0004SLOWVT: 8
TZAT-0004SLOWVT: 8
TZAT-0005FASTVT: 88 pct
TZAT-0005SLOWVT: 88 pct
TZAT-0005SLOWVT: 91 pct
TZAT-0011FASTVT: 10 ms
TZAT-0011SLOWVT: 10 ms
TZAT-0011SLOWVT: 10 ms
TZAT-0012FASTVT: 200 ms
TZAT-0012SLOWVT: 200 ms
TZAT-0012SLOWVT: 200 ms
TZAT-0013SLOWVT: 2
TZAT-0013SLOWVT: 2
TZAT-0018FASTVT: NEGATIVE
TZAT-0018SLOWVT: NEGATIVE
TZAT-0018SLOWVT: NEGATIVE
TZAT-0019FASTVT: 8 V
TZON-0003FASTVT: 240 ms
TZON-0003SLOWVT: 350 ms
TZON-0004SLOWVT: 16
TZON-0005SLOWVT: 40
TZON-0008FASTVT: 0 ms
TZON-0011AFLUTTER: 70
TZST-0001FASTVT: 3
TZST-0001FASTVT: 5
TZST-0001FASTVT: 6
TZST-0001SLOWVT: 4
TZST-0001SLOWVT: 6
TZST-0003FASTVT: 35 J
TZST-0003SLOWVT: 35 J
VENTRICULAR PACING ICD: 0 pct
VF: 0

## 2012-04-25 LAB — BASIC METABOLIC PANEL
CO2: 27 mEq/L (ref 19–32)
Calcium: 9.6 mg/dL (ref 8.4–10.5)
Chloride: 103 mEq/L (ref 96–112)
Creatinine, Ser: 1.4 mg/dL (ref 0.4–1.5)
Sodium: 139 mEq/L (ref 135–145)

## 2012-04-25 NOTE — Progress Notes (Signed)
   HPI  Adam Gillespie is a 56 y.o. male Seen in followup for an ICD implanted 2006 the setting of ischemic heart disease and   prior bypass surgery x5 in 2001  When we saw him last year, he was the first time we have seen in 6 years.   Apparently he had a Myoview 2011 that was okay. A catheterization in 2001 demonstrated severe left ventricular dysfunction  carotids were done ind January  He denies chest pain shortness of breath nocturnal dyspnea. He's had no syncope. He has occasional peripheral edema.   No renal function assessments are available in EPIC  Past Medical History  Diagnosis Date  . 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 Anoxic encephalopathy     POSTOP  . LV dysfunction   . Carotid stenosis, right     60-79% right, 40 - 59% left  . Hyperlipidemia   . Gout   . Hypothyroidism   . Ischemic cardiomyopathy     Myocardial perfusion imaging EF 26% old infarct anterior wall. 04/2010    Past Surgical History  Procedure Date  . Cardiac catheterization 08/05/2000    THERE IS ANTERIOR AND APICAL AKINESIS. EF IS ESTIMATED 20%, WITH INFEROBASILAR PORTIONS CONTRACTING REASONABLY WELL.  Marland Kitchen Coronary artery bypass graft     x5. lima to the lad, saphenous vein graft to the intermediate and obtuse mariginal, and a saphenous vein graft to the acute mariginal and distal right coronary artery  . Cardiac defibrillator placement     Current Outpatient Prescriptions  Medication Sig Dispense Refill  . allopurinol (ZYLOPRIM) 100 MG tablet Take 1 tablet (100 mg total) by mouth daily.  90 tablet  3  . aspirin 81 MG tablet Take 81 mg by mouth daily.        Marland Kitchen levothyroxine (SYNTHROID, LEVOTHROID) 125 MCG tablet Take 125 mcg by mouth daily.        . metoprolol (TOPROL-XL) 50 MG 24 hr tablet Take 1 tablet (50 mg total) by mouth daily.  90 tablet  3  . omeprazole (PRILOSEC OTC) 20 MG tablet Take 1 tablet (20 mg total) by mouth daily.  90 tablet  3  . simvastatin (ZOCOR) 40 MG tablet  Take 40 mg by mouth at bedtime.          No Known Allergies  Review of Systems negative except from HPI and PMH  Physical Exam BP 130/70  Pulse 74  Resp 19  Ht 5\' 4"  (1.626 m)  Wt 149 lb 3.2 oz (67.677 kg)  BMI 25.61 kg/m2  SpO2 92%  Well developed and well nourished in no acute distress HENT normal E scleral and icterus clear Neck Supple JVP flat; carotids brisk and full Clear to ausculation Regular rate and rhythm, no murmurs gallops or rub Soft with active bowel sounds No clubbing cyanosis none Edema Alert and oriented, grossly normal motor and sensory function Skin Warm and Dry    Assessment and  Plan

## 2012-04-25 NOTE — Patient Instructions (Addendum)
Remote monitoring is used to monitor your Pacemaker of ICD from home. This monitoring reduces the number of office visits required to check your device to one time per year. It allows [place] to keep an eye on the functioning of your device to ensure it is working properly. You are scheduled for a device check from home on 07/31/12. You may send your transmission at any time that day. If you have a wireless device, the transmission will be sent automatically. After your physician reviews your transmission, you will receive a postcard with your next transmission date.  Your physician wants you to follow-up in: 1 year with [name]. You will receive a reminder letter in the mail two months in advance. If you don't receive a letter, please call our office to schedule the follow-up appointment.  Your physician recommends that you continue on your current medications as directed. Please refer to the Current Medication list given to you today.  Your physician recommends that you have lab work today: bmp  Your physician has requested that you have an echocardiogram. Echocardiography is a painless test that uses sound waves to create images of your heart. It provides your doctor with information about the size and shape of your heart and how well your heart's chambers and valves are working. This procedure takes approximately one hour. There are no restrictions for this procedure.

## 2012-04-25 NOTE — Assessment & Plan Note (Signed)
He is stable. Last reports we have more severe left ventricular dysfunction prior bypass. We'll plan to repeat echo and look at left ventricular function; he should probably be on ACE inhibitor anyway. We'll also assess his kidney function. In the event that this precludes ACE inhibitor therapy we will consider hydralazine/nitrates

## 2012-04-25 NOTE — Assessment & Plan Note (Signed)
As above Euvolemic

## 2012-04-25 NOTE — Assessment & Plan Note (Signed)
The patient's device was interrogated.  The information was reviewed. No changes were made in the programming.    

## 2012-04-27 ENCOUNTER — Ambulatory Visit (HOSPITAL_COMMUNITY): Payer: Medicare Other | Attending: Internal Medicine | Admitting: Radiology

## 2012-04-27 DIAGNOSIS — I059 Rheumatic mitral valve disease, unspecified: Secondary | ICD-10-CM | POA: Insufficient documentation

## 2012-04-27 DIAGNOSIS — I252 Old myocardial infarction: Secondary | ICD-10-CM | POA: Insufficient documentation

## 2012-04-27 DIAGNOSIS — I2589 Other forms of chronic ischemic heart disease: Secondary | ICD-10-CM | POA: Insufficient documentation

## 2012-04-27 DIAGNOSIS — I509 Heart failure, unspecified: Secondary | ICD-10-CM | POA: Insufficient documentation

## 2012-04-27 DIAGNOSIS — I5020 Unspecified systolic (congestive) heart failure: Secondary | ICD-10-CM | POA: Insufficient documentation

## 2012-04-27 DIAGNOSIS — I679 Cerebrovascular disease, unspecified: Secondary | ICD-10-CM | POA: Insufficient documentation

## 2012-04-27 DIAGNOSIS — I5022 Chronic systolic (congestive) heart failure: Secondary | ICD-10-CM

## 2012-04-27 DIAGNOSIS — I2510 Atherosclerotic heart disease of native coronary artery without angina pectoris: Secondary | ICD-10-CM | POA: Insufficient documentation

## 2012-04-27 DIAGNOSIS — I359 Nonrheumatic aortic valve disorder, unspecified: Secondary | ICD-10-CM | POA: Insufficient documentation

## 2012-04-27 NOTE — Progress Notes (Signed)
Echocardiogram performed.  

## 2012-05-08 ENCOUNTER — Telehealth: Payer: Self-pay | Admitting: Internal Medicine

## 2012-05-08 MED ORDER — HYDRALAZINE HCL 25 MG PO TABS: ORAL_TABLET | ORAL | Status: DC

## 2012-05-08 MED ORDER — ISOSORBIDE MONONITRATE ER 30 MG PO TB24: 30.0000 mg | ORAL_TABLET | Freq: Every day | ORAL | Status: DC

## 2012-05-08 NOTE — Telephone Encounter (Signed)
New msg Pt's wife wants to get test results

## 2012-05-08 NOTE — Telephone Encounter (Signed)
Advised daughter of results and new medications.  Scheduled follow up appointment

## 2012-05-08 NOTE — Telephone Encounter (Signed)
Message copied by Burnell Blanks on Mon May 08, 2012  5:58 PM ------      Message from: Duke Salvia      Created: Mon May 01, 2012  6:32 PM       Please Inform Patient that the echo  was abnormal and we should begin him on hydral and isdn, ace precluded by high K      Please start hydral 12.5 bid and imdur 30 daialy      plz have follwoup with PA in 4 weeks and we should think about getting to heart failure cliinic            Thanks

## 2012-05-10 ENCOUNTER — Telehealth: Payer: Self-pay | Admitting: Internal Medicine

## 2012-05-10 NOTE — Telephone Encounter (Signed)
Advised pt to break 25mg  tab in half and take BID.

## 2012-05-10 NOTE — Telephone Encounter (Signed)
Pt is on Hydralazine 25mg  and can not cut it in half because it is to small and wants to know if he can get a smaller dose or can he just take the whole pill at one time please call and advise

## 2012-05-22 ENCOUNTER — Encounter: Payer: Self-pay | Admitting: Nurse Practitioner

## 2012-06-05 ENCOUNTER — Encounter: Payer: Self-pay | Admitting: Nurse Practitioner

## 2012-06-05 ENCOUNTER — Ambulatory Visit (INDEPENDENT_AMBULATORY_CARE_PROVIDER_SITE_OTHER): Payer: Medicare Other | Admitting: Nurse Practitioner

## 2012-06-05 VITALS — BP 90/62 | HR 72 | Ht 64.0 in | Wt 148.0 lb

## 2012-06-05 DIAGNOSIS — I255 Ischemic cardiomyopathy: Secondary | ICD-10-CM

## 2012-06-05 DIAGNOSIS — I2589 Other forms of chronic ischemic heart disease: Secondary | ICD-10-CM

## 2012-06-05 DIAGNOSIS — I779 Disorder of arteries and arterioles, unspecified: Secondary | ICD-10-CM

## 2012-06-05 DIAGNOSIS — R0989 Other specified symptoms and signs involving the circulatory and respiratory systems: Secondary | ICD-10-CM

## 2012-06-05 NOTE — Progress Notes (Signed)
Adam Gillespie Date of Birth: April 08, 1956 Medical Record #[id number]  History of Present Illness: Adam Gillespie is seen back today for a one month check. He is seen for [name]. He has a known ischemic CM with remote CABG in 2001. Had his ICD implanted in 2006. Last Myoview was in 2011. He was a former patient of Dr. [name]. His other issues include GERD, HLD and hypothyroidism. He has a tendency towards hyperkalemia.   He comes in today. He is here with his mother and sister. He was seen last month and referred for repeat echo and back today for discussion. Was started on low dose nitrates and hydralazine. He is doing well. He has actually done very well since his original event back in 2001. He does not have chest pain. He is not short of breath. No swelling. Family manages his medicines and his salt intake. He works hard in the family garden. He is not dizzy or lightheaded. They do notice that his blood pressure is a little lower but he is totally asymptomatic. There has been some concern about his labs. He has always had a tendency towards hyperkalemia and has some renal insufficiency. They were told that he needs to be referred to the CHF clinic but Thornton and his family are quite happy with how he is doing.   Current Outpatient Prescriptions on File Prior to Visit  Medication Sig Dispense Refill  . allopurinol (ZYLOPRIM) 100 MG tablet Take 1 tablet (100 mg total) by mouth daily.  90 tablet  3  . aspirin 81 MG tablet Take 81 mg by mouth daily.        . hydrALAZINE (APRESOLINE) 25 MG tablet 1/2 tablet twice a day  30 tablet  3  . isosorbide mononitrate (IMDUR) 30 MG 24 hr tablet Take 1 tablet (30 mg total) by mouth daily.  30 tablet  3  . levothyroxine (SYNTHROID, LEVOTHROID) 125 MCG tablet Take 125 mcg by mouth daily.        . metoprolol (TOPROL-XL) 50 MG 24 hr tablet Take 1 tablet (50 mg total) by mouth daily.  90 tablet  3  . omeprazole (PRILOSEC OTC) 20 MG tablet Take 1 tablet (20 mg total) by  mouth daily.  90 tablet  3  . simvastatin (ZOCOR) 40 MG tablet Take 40 mg by mouth at bedtime.          No Known Allergies  Past Medical History  Diagnosis Date  . 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 Anoxic encephalopathy     POSTOP FROM 2001  . LV dysfunction     EF is 25% per echo in July 2013  . Carotid stenosis, right     60-79% right, 40 - 59% left - needs dopplers in Jan 2014  . Hyperlipidemia   . Gout   . Hypothyroidism   . Ischemic cardiomyopathy     Remote MI with CABG x 5 in 2001; Myocardial perfusion imaging EF 26% old infarct anterior wall. 04/2010    Past Surgical History  Procedure Date  . Cardiac catheterization 08/05/2000    THERE IS ANTERIOR AND APICAL AKINESIS. EF IS ESTIMATED 20%, WITH INFEROBASILAR PORTIONS CONTRACTING REASONABLY WELL.  Marland Kitchen Coronary artery bypass graft     x5. lima to the lad, saphenous vein graft to the intermediate and obtuse mariginal, and a saphenous vein graft to the acute mariginal and distal right coronary artery  . Cardiac defibrillator placement     History  Smoking status  .  Never Smoker   Smokeless tobacco  . Current User  . Types: Chew    History  Alcohol Use No    Family History  Problem Relation Age of Onset  . Heart attack Mother   . Cancer Father     Review of Systems: The review of systems is per the HPI.  All other systems were reviewed and are negative.  Physical Exam: BP 90/62  Pulse 72  Ht 5\' 4"  (1.626 m)  Wt 148 lb (67.132 kg)  BMI 25.40 kg/m2 Patient is very pleasant and in no acute distress. Skin is warm and dry. Color is normal.  HEENT is unremarkable. Normocephalic/atraumatic. PERRL. Sclera are nonicteric. Neck is supple. No masses. No JVD. Lungs are clear. Cardiac exam shows a regular rate and rhythm. No gallop. Abdomen is soft. Extremities are without edema. Gait and ROM are intact. No gross neurologic deficits noted.   LABORATORY DATA:  Echo Study Conclusions from July 2013  - Left  ventricle: LVEF is approixmately 25% with akinesis of theinferior,inferoseptal, distal anterior and apical walls; hypookinesis elsewhere. The cavity size was normal. Wall thickness was normal. - Aortic valve: Mild regurgitation. - Mitral valve: Mild regurgitation.    Chemistry      Component Value Date/Time   NA 139 04/25/2012 1124   K 5.2* 04/25/2012 1124   CL 103 04/25/2012 1124   CO2 27 04/25/2012 1124   BUN 24* 04/25/2012 1124   CREATININE 1.4 04/25/2012 1124      Component Value Date/Time   CALCIUM 9.6 04/25/2012 1124   ALKPHOS 63 11/01/2011 0852   AST 22 11/01/2011 0852   ALT 23 11/01/2011 0852   BILITOT 0.7 11/01/2011 0852        Assessment / Plan:

## 2012-06-05 NOTE — Assessment & Plan Note (Signed)
He continues to do very well. Still has good physical endurance and tries to stay active. He is on a better CHF regimen and is tolerating. He is very well compensated. BP is a little lower but he is totally asymptomatic. I do not think he will be able to tolerate any up titration of his medicines. His family says he is using some Red Bull and I have asked him to stop that. I will see him back in about 4 months. Patient is agreeable to this plan and will call if any problems develop in the interim.

## 2012-06-05 NOTE — Assessment & Plan Note (Signed)
He will need a repeat duplex in January.

## 2012-06-05 NOTE — Patient Instructions (Addendum)
I think you are doing well.  Stay on your current medicines  See me in 4 months.   When I see you back we will schedule your carotid dopplers  Minimize the salt; watch for swelling and call us if he complains of being dizzy or lightheaded.  No RED BULL!  Call the [facility] office at [phone] if you have any questions, problems or concerns.

## 2012-07-27 ENCOUNTER — Other Ambulatory Visit: Payer: Self-pay | Admitting: Internal Medicine

## 2012-07-27 MED ORDER — HYDRALAZINE HCL 25 MG PO TABS: ORAL_TABLET | ORAL | Status: DC

## 2012-07-27 MED ORDER — ISOSORBIDE MONONITRATE ER 30 MG PO TB24: 30.0000 mg | ORAL_TABLET | Freq: Every day | ORAL | Status: DC

## 2012-07-31 ENCOUNTER — Ambulatory Visit (INDEPENDENT_AMBULATORY_CARE_PROVIDER_SITE_OTHER): Payer: Medicare Other | Admitting: *Deleted

## 2012-07-31 ENCOUNTER — Encounter: Payer: Self-pay | Admitting: Internal Medicine

## 2012-07-31 DIAGNOSIS — Z95810 Presence of automatic (implantable) cardiac defibrillator: Secondary | ICD-10-CM

## 2012-07-31 DIAGNOSIS — I2589 Other forms of chronic ischemic heart disease: Secondary | ICD-10-CM

## 2012-07-31 DIAGNOSIS — I255 Ischemic cardiomyopathy: Secondary | ICD-10-CM

## 2012-08-01 ENCOUNTER — Other Ambulatory Visit: Payer: Self-pay | Admitting: Gastroenterology

## 2012-08-01 DIAGNOSIS — R1310 Dysphagia, unspecified: Secondary | ICD-10-CM

## 2012-08-02 LAB — REMOTE ICD DEVICE
CHARGE TIME: 9.79 s
DEV-0020ICD: NEGATIVE
TZAT-0001FASTVT: 1
TZAT-0004FASTVT: 8
TZAT-0005SLOWVT: 88 pct
TZAT-0011SLOWVT: 10 ms
TZAT-0011SLOWVT: 10 ms
TZAT-0012FASTVT: 200 ms
TZAT-0019SLOWVT: 8 V
TZAT-0019SLOWVT: 8 V
TZAT-0020FASTVT: 1.6 ms
TZON-0003FASTVT: 240 ms
TZON-0011AFLUTTER: 70
TZST-0001FASTVT: 2
TZST-0001FASTVT: 4
TZST-0001SLOWVT: 3
TZST-0001SLOWVT: 4
TZST-0001SLOWVT: 5
TZST-0003FASTVT: 35 J
TZST-0003FASTVT: 35 J
TZST-0003SLOWVT: 25 J
TZST-0003SLOWVT: 35 J

## 2012-08-03 ENCOUNTER — Telehealth: Payer: Self-pay | Admitting: Internal Medicine
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
## 2012-08-03 NOTE — Telephone Encounter (Signed)
New id card ordered for pt. Spoke w/family member to let know will receive in next 7-10 days.

## 2012-08-04 ENCOUNTER — Ambulatory Visit
Admission: RE | Admit: 2012-08-04 | Discharge: 2012-08-04 | Disposition: A | Discharge status: Home / Self Care | Payer: Medicare Other | Source: Ambulatory Visit | Attending: Gastroenterology | Admitting: Gastroenterology

## 2012-08-04 ENCOUNTER — Telehealth: Payer: Self-pay | Admitting: Internal Medicine

## 2012-08-04 DIAGNOSIS — R1310 Dysphagia, unspecified: Secondary | ICD-10-CM

## 2012-08-04 NOTE — Telephone Encounter (Signed)
Dr Marge Duncans office, Efraim Kaufmann, needs to know what kind of device pt has and if we have the model number since pt lost his id card, told her it has been ordered

## 2012-08-04 NOTE — Telephone Encounter (Signed)
Spoke with Melissa @ [employer] and gave her device info along with contact person for [employer].

## 2012-08-18 ENCOUNTER — Encounter: Payer: Self-pay | Admitting: *Deleted

## 2012-10-03 ENCOUNTER — Encounter (INDEPENDENT_AMBULATORY_CARE_PROVIDER_SITE_OTHER): Payer: Medicare Other

## 2012-10-03 ENCOUNTER — Ambulatory Visit (INDEPENDENT_AMBULATORY_CARE_PROVIDER_SITE_OTHER): Payer: Medicare Other | Admitting: Nurse Practitioner

## 2012-10-03 ENCOUNTER — Encounter: Payer: Self-pay | Admitting: Nurse Practitioner

## 2012-10-03 VITALS — BP 110/78 | HR 80 | Ht 65.0 in | Wt 148.1 lb

## 2012-10-03 DIAGNOSIS — I10 Essential (primary) hypertension: Secondary | ICD-10-CM

## 2012-10-03 DIAGNOSIS — I6529 Occlusion and stenosis of unspecified carotid artery: Secondary | ICD-10-CM

## 2012-10-03 DIAGNOSIS — I255 Ischemic cardiomyopathy: Secondary | ICD-10-CM

## 2012-10-03 DIAGNOSIS — I779 Disorder of arteries and arterioles, unspecified: Secondary | ICD-10-CM

## 2012-10-03 DIAGNOSIS — I2589 Other forms of chronic ischemic heart disease: Secondary | ICD-10-CM

## 2012-10-03 LAB — BASIC METABOLIC PANEL
BUN: 21 mg/dL (ref 6–23)
CO2: 26 mEq/L (ref 19–32)
Calcium: 9.8 mg/dL (ref 8.4–10.5)
Chloride: 102 mEq/L (ref 96–112)
Creatinine, Ser: 1.3 mg/dL (ref 0.4–1.5)
GFR: 62.52 mL/min (ref >60.00)
Glucose, Bld: 92 mg/dL (ref 70–99)
Potassium: 5.4 mEq/L — ABNORMAL HIGH (ref 3.5–5.1)
Sodium: 136 mEq/L (ref 135–145)

## 2012-10-03 LAB — LIPID PANEL
Cholesterol: 139 mg/dL (ref 0–200)
HDL: 36 mg/dL — ABNORMAL LOW (ref >39.00)
Total CHOL/HDL Ratio: 4
Triglycerides: 233 mg/dL — ABNORMAL HIGH (ref 0.0–149.0)
VLDL: 46.6 mg/dL — ABNORMAL HIGH (ref 0.0–40.0)

## 2012-10-03 LAB — HEPATIC FUNCTION PANEL
ALT: 17 U/L (ref 0–53)
AST: 16 U/L (ref 0–37)
Albumin: 3.9 g/dL (ref 3.5–5.2)
Alkaline Phosphatase: 66 U/L (ref 39–117)
Bilirubin, Direct: 0 mg/dL (ref 0.0–0.3)
Total Bilirubin: 0.7 mg/dL (ref 0.3–1.2)
Total Protein: 7.3 g/dL (ref 6.0–8.3)

## 2012-10-03 LAB — TSH: TSH: 1.26 u[IU]/mL (ref 0.35–5.50)

## 2012-10-03 LAB — LDL CHOLESTEROL, DIRECT: Direct LDL: 75.9 mg/dL

## 2012-10-03 MED ORDER — HYDRALAZINE HCL 25 MG PO TABS: ORAL_TABLET | ORAL | Status: DC

## 2012-10-03 MED ORDER — ISOSORBIDE MONONITRATE ER 30 MG PO TB24: 30.0000 mg | ORAL_TABLET | Freq: Every day | ORAL | Status: DC

## 2012-10-03 MED ORDER — ALLOPURINOL 100 MG PO TABS: 100.0000 mg | ORAL_TABLET | Freq: Every day | ORAL | Status: DC

## 2012-10-03 MED ORDER — LEVOTHYROXINE SODIUM 125 MCG PO TABS: 125.0000 ug | ORAL_TABLET | Freq: Every day | ORAL | Status: DC

## 2012-10-03 MED ORDER — METOPROLOL SUCCINATE ER 50 MG PO TB24: 50.0000 mg | ORAL_TABLET | Freq: Every day | ORAL | Status: DC

## 2012-10-03 MED ORDER — SIMVASTATIN 40 MG PO TABS: 40.0000 mg | ORAL_TABLET | Freq: Every day | ORAL | Status: DC

## 2012-10-03 NOTE — Patient Instructions (Addendum)
We need to get a repeat carotid doppler in 6 months  Stay on your current medicines  Get your flu shot and pneumovax vaccine  I want to see him in 6 months  Keep avoiding the salt. Try to stay active.   Call the [facility] office at [phone] if you have any questions, problems or concerns.

## 2012-10-03 NOTE — Progress Notes (Addendum)
Adam Gillespie Date of Birth: [birth year] Medical Record #[id number]  History of Present Illness: Adam Gillespie is seen back today for a 4 month check. He is seen for [name]. He has a known ischemic Cm with remote CABG in 2001. Had his ICD implanted in 2006. Last Myoview was in 2011. He was a former patient of Dr. [name]. His other issues include GERD, HLD and hypothyroidism. He has a tendency towards hyperkalemia. He has actually done remarkably well since his initial event.  He and his family have not wanted to go the CHF clinic. Echo back in July showed an EF of 25%. He is not able to be on ACE due to hyperkalemia and renal insufficiency.   He comes in today. He is here with his mom and sister. He is doing well. He is a little more hard of hearing. He has resisted going to get a hearing test/aid. No chest pain. Not as active due to the colder months than he is in the summer. But he has been getting up leaves. Minimal dyspnea with activity.Not dizzy or lightheaded. No cough. No swelling. No PND/orthopnea. Family oversees his salt intake. He has stopped drinking Red Bull. No chest pain. Doppler study is reviewed with his family. Was started on Vesicare for urinary incontinence.   Current Outpatient Prescriptions on File Prior to Visit  Medication Sig Dispense Refill  . allopurinol (ZYLOPRIM) 100 MG tablet Take 1 tablet (100 mg total) by mouth daily.  90 tablet  3  . aspirin 81 MG tablet Take 81 mg by mouth daily.        . hydrALAZINE (APRESOLINE) 25 MG tablet 1/2 tablet twice a day  90 tablet  3  . isosorbide mononitrate (IMDUR) 30 MG 24 hr tablet Take 1 tablet (30 mg total) by mouth daily.  90 tablet  3  . levothyroxine (SYNTHROID, LEVOTHROID) 125 MCG tablet Take 1 tablet (125 mcg total) by mouth daily.  90 tablet  3  . metoprolol succinate (TOPROL-XL) 50 MG 24 hr tablet Take 1 tablet (50 mg total) by mouth daily.  90 tablet  3  . omeprazole (PRILOSEC OTC) 20 MG tablet Take 1 tablet (20 mg total)  by mouth daily.  90 tablet  3  . simvastatin (ZOCOR) 40 MG tablet Take 1 tablet (40 mg total) by mouth at bedtime.  90 tablet  3    No Known Allergies  Past Medical History  Diagnosis Date  . 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 Anoxic encephalopathy     POSTOP FROM 2001  . LV dysfunction     EF is 25% per echo in July 2013  . Carotid stenosis, right     60-79% right, 40 - 59% left - needs dopplers in June 2014  . Hyperlipidemia   . Gout   . Hypothyroidism   . Ischemic cardiomyopathy     Remote MI with CABG x 5 in 2001; Myocardial perfusion imaging EF 26% old infarct anterior wall. 04/2010    Past Surgical History  Procedure Date  . Cardiac catheterization 08/05/2000    THERE IS ANTERIOR AND APICAL AKINESIS. EF IS ESTIMATED 20%, WITH INFEROBASILAR PORTIONS CONTRACTING REASONABLY WELL.  Marland Kitchen Coronary artery bypass graft     x5. lima to the lad, saphenous vein graft to the intermediate and obtuse mariginal, and a saphenous vein graft to the acute mariginal and distal right coronary artery  . Cardiac defibrillator placement     History  Smoking status  .  Never Smoker   Smokeless tobacco  . Current User  . Types: Chew    History  Alcohol Use No    Family History  Problem Relation Age of Onset  . Heart attack Mother   . Cancer Father     Review of Systems: The review of systems is per the HPI.  All other systems were reviewed and are negative.  Physical Exam: BP 110/78  Pulse 80  Ht 5\' 5"  (1.651 m)  Wt 148 lb 1.9 oz (67.187 kg)  BMI 24.65 kg/m2 His weight is stable.  Patient is very pleasant and in no acute distress. Skin is warm and dry. Color is normal.  HEENT is unremarkable. Normocephalic/atraumatic. PERRL. Sclera are nonicteric. Neck is supple. No masses. No JVD. Lungs are clear. Cardiac exam shows a regular rate and rhythm. Abdomen is soft. Extremities are without edema. Gait and ROM are intact. No gross neurologic deficits noted.   LABORATORY DATA:  Pending for today.   Lab Results  Component Value Date   GLUCOSE 97 04/25/2012   CHOL 143 11/01/2011   TRIG 154.0* 11/01/2011   HDL 42.00 11/01/2011   LDLCALC 70 11/01/2011   ALT 23 11/01/2011   AST 22 11/01/2011   NA 139 04/25/2012   K 5.2* 04/25/2012   CL 103 04/25/2012   CREATININE 1.4 04/25/2012   BUN 24* 04/25/2012   CO2 27 04/25/2012   Preliminary carotid dopplers show 60 to 79% on the right and 40 to 59% on the left. This will need to be repeated in 6 months.  Assessment / Plan:  1. Ischemic CM - EF is 25% - managed medically - looks compensated. No change in his medicines today.   2. Carotid disease - for recheck in 6 months  3. HLD - recheck labs today  4. ICD - followed by [name]  Overall, he seems to be holding his own. His family is happy with how Adam Gillespie is doing. Medicines are refilled today. We will check labs today. We will see him back in 6 months with a carotid duplex.   Patient and his family are agreeable to this plan and will call if any problems develop in the interim.

## 2012-11-06 ENCOUNTER — Encounter: Payer: Medicare Other | Admitting: *Deleted

## 2012-11-10 ENCOUNTER — Encounter: Payer: Self-pay | Admitting: *Deleted

## 2012-11-17 ENCOUNTER — Ambulatory Visit (INDEPENDENT_AMBULATORY_CARE_PROVIDER_SITE_OTHER): Payer: Medicare Other | Admitting: *Deleted

## 2012-11-17 DIAGNOSIS — Z95810 Presence of automatic (implantable) cardiac defibrillator: Secondary | ICD-10-CM

## 2012-11-17 DIAGNOSIS — I255 Ischemic cardiomyopathy: Secondary | ICD-10-CM

## 2012-11-17 DIAGNOSIS — I2589 Other forms of chronic ischemic heart disease: Secondary | ICD-10-CM

## 2012-11-17 LAB — REMOTE ICD DEVICE
BATTERY VOLTAGE: 2.78 V
RV LEAD AMPLITUDE: 8.8 mv
TOT-0006: 20131014000000
TZAT-0004FASTVT: 8
TZAT-0011SLOWVT: 10 ms
TZAT-0011SLOWVT: 10 ms
TZAT-0012FASTVT: 200 ms
TZAT-0012SLOWVT: 200 ms
TZAT-0012SLOWVT: 200 ms
TZAT-0013FASTVT: 1
TZAT-0018SLOWVT: NEGATIVE
TZAT-0018SLOWVT: NEGATIVE
TZAT-0019SLOWVT: 8 V
TZAT-0019SLOWVT: 8 V
TZAT-0020FASTVT: 1.6 ms
TZON-0003FASTVT: 240 ms
TZON-0003SLOWVT: 350 ms
TZON-0004SLOWVT: 16
TZON-0005SLOWVT: 40
TZON-0008FASTVT: 0 ms
TZON-0008SLOWVT: 0 ms
TZON-0011AFLUTTER: 70
TZST-0001FASTVT: 4
TZST-0001FASTVT: 5
TZST-0001SLOWVT: 4
TZST-0001SLOWVT: 6
TZST-0003FASTVT: 35 J
TZST-0003FASTVT: 35 J
TZST-0003SLOWVT: 25 J
TZST-0003SLOWVT: 35 J
VENTRICULAR PACING ICD: 0 pct

## 2012-12-12 ENCOUNTER — Encounter: Payer: Self-pay | Admitting: *Deleted

## 2012-12-18 ENCOUNTER — Encounter: Payer: Self-pay | Admitting: Internal Medicine

## 2012-12-26 ENCOUNTER — Other Ambulatory Visit: Payer: Self-pay | Admitting: *Deleted

## 2012-12-26 MED ORDER — SIMVASTATIN 40 MG PO TABS: 40.0000 mg | ORAL_TABLET | Freq: Every day | ORAL | Status: DC

## 2012-12-26 MED ORDER — METOPROLOL SUCCINATE ER 50 MG PO TB24: 50.0000 mg | ORAL_TABLET | Freq: Every day | ORAL | Status: DC

## 2012-12-26 MED ORDER — ISOSORBIDE MONONITRATE ER 30 MG PO TB24: 30.0000 mg | ORAL_TABLET | Freq: Every day | ORAL | Status: DC

## 2012-12-27 ENCOUNTER — Other Ambulatory Visit: Payer: Self-pay

## 2012-12-27 MED ORDER — HYDRALAZINE HCL 25 MG PO TABS: ORAL_TABLET | ORAL | Status: DC

## 2012-12-28 ENCOUNTER — Encounter (HOSPITAL_COMMUNITY): Payer: Self-pay

## 2012-12-29 ENCOUNTER — Other Ambulatory Visit: Payer: Self-pay | Admitting: *Deleted

## 2012-12-29 MED ORDER — LEVOTHYROXINE SODIUM 125 MCG PO TABS: 125.0000 ug | ORAL_TABLET | Freq: Every day | ORAL | Status: DC

## 2013-01-01 ENCOUNTER — Encounter (HOSPITAL_COMMUNITY): Payer: Self-pay | Admitting: *Deleted

## 2013-01-03 NOTE — Progress Notes (Addendum)
Called to schedule the [employer] sales representative for Endoscopy procedure Tuesday, January 09, 2013 at 0900 does not need to come for the procedure.

## 2013-01-09 ENCOUNTER — Ambulatory Visit (HOSPITAL_COMMUNITY): Payer: Medicare Other | Admitting: Anesthesiology

## 2013-01-09 ENCOUNTER — Encounter (HOSPITAL_COMMUNITY): Payer: Self-pay | Admitting: Anesthesiology

## 2013-01-09 ENCOUNTER — Encounter (HOSPITAL_COMMUNITY)
Admission: RE | Disposition: A | Discharge status: Home / Self Care | Payer: Self-pay | Source: Ambulatory Visit | Attending: Gastroenterology

## 2013-01-09 ENCOUNTER — Ambulatory Visit (HOSPITAL_COMMUNITY)
Admission: RE | Admit: 2013-01-09 | Discharge: 2013-01-09 | Disposition: A | Discharge status: Home / Self Care | Payer: Medicare Other | Source: Ambulatory Visit | Attending: Gastroenterology | Admitting: Gastroenterology

## 2013-01-09 ENCOUNTER — Encounter (HOSPITAL_COMMUNITY): Payer: Self-pay | Admitting: *Deleted

## 2013-01-09 DIAGNOSIS — Z83719 Family history of colon polyps, unspecified: Secondary | ICD-10-CM | POA: Insufficient documentation

## 2013-01-09 DIAGNOSIS — I129 Hypertensive chronic kidney disease with stage 1 through stage 4 chronic kidney disease, or unspecified chronic kidney disease: Secondary | ICD-10-CM | POA: Insufficient documentation

## 2013-01-09 DIAGNOSIS — E7800 Pure hypercholesterolemia, unspecified: Secondary | ICD-10-CM | POA: Insufficient documentation

## 2013-01-09 DIAGNOSIS — K219 Gastro-esophageal reflux disease without esophagitis: Secondary | ICD-10-CM | POA: Insufficient documentation

## 2013-01-09 DIAGNOSIS — I2510 Atherosclerotic heart disease of native coronary artery without angina pectoris: Secondary | ICD-10-CM | POA: Insufficient documentation

## 2013-01-09 DIAGNOSIS — Z1211 Encounter for screening for malignant neoplasm of colon: Secondary | ICD-10-CM | POA: Insufficient documentation

## 2013-01-09 DIAGNOSIS — E039 Hypothyroidism, unspecified: Secondary | ICD-10-CM | POA: Insufficient documentation

## 2013-01-09 DIAGNOSIS — Z8371 Family history of colonic polyps: Secondary | ICD-10-CM | POA: Insufficient documentation

## 2013-01-09 DIAGNOSIS — N189 Chronic kidney disease, unspecified: Secondary | ICD-10-CM | POA: Insufficient documentation

## 2013-01-09 DIAGNOSIS — K648 Other hemorrhoids: Secondary | ICD-10-CM | POA: Insufficient documentation

## 2013-01-09 DIAGNOSIS — Z79899 Other long term (current) drug therapy: Secondary | ICD-10-CM | POA: Insufficient documentation

## 2013-01-09 DIAGNOSIS — K5730 Diverticulosis of large intestine without perforation or abscess without bleeding: Secondary | ICD-10-CM | POA: Insufficient documentation

## 2013-01-09 HISTORY — PX: COLONOSCOPY WITH PROPOFOL: SHX5780

## 2013-01-09 HISTORY — DX: Unspecified hearing loss, unspecified ear: H91.90

## 2013-01-09 LAB — POCT I-STAT 4, (NA,K, GLUC, HGB,HCT)
HCT: 47 % (ref 39.0–52.0)
Sodium: 140 mEq/L (ref 135–145)

## 2013-01-09 SURGERY — COLONOSCOPY WITH PROPOFOL
Anesthesia: Monitor Anesthesia Care

## 2013-01-09 MED ORDER — LACTATED RINGERS IV SOLN
INTRAVENOUS | Status: DC
  Administered 2013-01-09: 09:00:00 via INTRAVENOUS

## 2013-01-09 MED ORDER — MIDAZOLAM HCL 5 MG/5ML IJ SOLN
INTRAMUSCULAR | Status: DC | PRN
  Administered 2013-01-09: 2 mg via INTRAVENOUS

## 2013-01-09 MED ORDER — KETAMINE HCL 10 MG/ML IJ SOLN
INTRAMUSCULAR | Status: DC | PRN
  Administered 2013-01-09 (×2): 10 mg via INTRAVENOUS

## 2013-01-09 MED ORDER — SODIUM CHLORIDE 0.9 % IV SOLN: INTRAVENOUS | Status: DC

## 2013-01-09 MED ORDER — PROPOFOL 10 MG/ML IV EMUL
INTRAVENOUS | Status: DC | PRN
  Administered 2013-01-09: 100 ug/kg/min via INTRAVENOUS

## 2013-01-09 SURGICAL SUPPLY — 22 items

## 2013-01-09 NOTE — Anesthesia Preprocedure Evaluation (Signed)
Anesthesia Evaluation  Patient identified by MRN, date of birth, ID band Patient awake    Reviewed: Allergy & Precautions, H&P , NPO status , Patient's Chart, lab work & pertinent test results, reviewed documented beta blocker date and time   Airway Mallampati: II TM Distance: >3 FB Neck ROM: full    Dental   Pulmonary neg pulmonary ROS,  breath sounds clear to auscultation        Cardiovascular hypertension, On Medications and On Home Beta Blockers + CAD, + Past MI, + CABG, + Peripheral Vascular Disease and +CHF Rhythm:regular     Neuro/Psych negative psych ROS   GI/Hepatic negative GI ROS, Neg liver ROS,   Endo/Other  Hypothyroidism   Renal/GU negative Renal ROS  negative genitourinary   Musculoskeletal   Abdominal   Peds  Hematology negative hematology ROS (+)   Anesthesia Other Findings See surgeon's H&P   Reproductive/Obstetrics negative OB ROS                           Anesthesia Physical Anesthesia Plan  ASA: III  Anesthesia Plan: MAC   Post-op Pain Management:    Induction: Intravenous  Airway Management Planned: Simple Face Mask  Additional Equipment:   Intra-op Plan:   Post-operative Plan:   Informed Consent: I have reviewed the patients History and Physical, chart, labs and discussed the procedure including the risks, benefits and alternatives for the proposed anesthesia with the patient or authorized representative who has indicated his/her understanding and acceptance.   Dental Advisory Given  Plan Discussed with: CRNA and Surgeon  Anesthesia Plan Comments:         Anesthesia Quick Evaluation

## 2013-01-09 NOTE — Anesthesia Postprocedure Evaluation (Signed)
Anesthesia Post Note  Patient: Adam Gillespie  Procedure(s) Performed: Procedure(s) (LRB): COLONOSCOPY WITH PROPOFOL (N/A)  Anesthesia type: MAC  Patient location: PACU  Post pain: Pain level controlled  Post assessment: Patient's Cardiovascular Status Stable  Last Vitals:  Filed Vitals:   01/09/13 0945  BP: 92/48  Pulse:   Temp:   Resp: 15    Post vital signs: Reviewed and stable  Level of consciousness: alert  Complications: No apparent anesthesia complications

## 2013-01-09 NOTE — H&P (Signed)
  Date of Initial H&P: 01/04/13  History reviewed, patient examined, no change in status, stable for surgery. Colonoscopy for screening purposes. See H and P for details. No changes.

## 2013-01-09 NOTE — Op Note (Signed)
[facility] [address] [place], [address]   COLONOSCOPY PROCEDURE REPORT  PATIENT: Adam Gillespie, Adam Gillespie  MR#: [id number] BIRTHDATE: 1956-03-04 , 56  yrs. old GENDER: Male ENDOSCOPIST: Charlott Rakes, MD REFERRED AO:ZHYQM Hamrick, M.D. PROCEDURE DATE:  01/09/2013 PROCEDURE:   Colonoscopy, diagnostic ASA CLASS:   Class III INDICATIONS:Colorectal cancer screening. MEDICATIONS: See Anesthesia Report.  DESCRIPTION OF PROCEDURE:   After the risks benefits and alternatives of the procedure were thoroughly explained, informed consent was obtained.  The [id number] ([id number])  endoscope was introduced through the anus and advanced to the cecum, which was identified by both the appendix and ileocecal valve , limited by No adverse events experienced.   The quality of the prep was good. . The instrument was then slowly withdrawn as the colon was fully examined.     FINDINGS:  Rectal exam unremarkable.  Pediatric colonoscope inserted into the colon and advanced to the cecum, where the appendiceal orifice and ileocecal valve were identified.    On careful withdrawal of the colonoscope scattered small sigmoid diverticuli were seen.   Retroflexion revealed medium-sized internal hemorrhoids.  COMPLICATIONS: None  IMPRESSION:     1. Sigmoid diverticulosis 2. Internal hemorrhoids  RECOMMENDATIONS: Repeat colonoscopy in 10 years; High fiber diet    ______________________________ eSignedCharlott Rakes, MD 01/09/2013 9:31 AM   CC:

## 2013-01-09 NOTE — Preoperative (Signed)
Beta Blockers   Reason not to administer Beta Blockers:Took Metoprolol this am. 

## 2013-01-09 NOTE — Interval H&P Note (Signed)
History and Physical Interval Note:  01/09/2013 8:48 AM  Adam Gillespie  has presented today for surgery, with the diagnosis of screening  The various methods of treatment have been discussed with the patient and family. After consideration of risks, benefits and other options for treatment, the patient has consented to  Procedure(s): COLONOSCOPY WITH PROPOFOL (N/A) as a surgical intervention .  The patient's history has been reviewed, patient examined, no change in status, stable for surgery.  I have reviewed the patient's chart and labs.  Questions were answered to the patient's satisfaction.     Jaskiran Pata C.

## 2013-01-09 NOTE — Transfer of Care (Signed)
Immediate Anesthesia Transfer of Care Note  Patient: Adam Gillespie  Procedure(s) Performed: Procedure(s): COLONOSCOPY WITH PROPOFOL (N/A)  Patient Location: PACU and Endoscopy Unit  Anesthesia Type:MAC  Level of Consciousness: sedated  Airway & Oxygen Therapy: Patient Spontanous Breathing and Patient connected to face mask oxygen  Post-op Assessment: Report given to PACU RN and Post -op Vital signs reviewed and stable  Post vital signs: Reviewed and stable  Complications: No apparent anesthesia complications

## 2013-01-10 ENCOUNTER — Encounter (HOSPITAL_COMMUNITY): Payer: Self-pay | Admitting: Gastroenterology

## 2013-02-12 ENCOUNTER — Encounter: Payer: Medicare Other | Admitting: *Deleted

## 2013-02-12 ENCOUNTER — Other Ambulatory Visit: Payer: Self-pay

## 2013-02-14 ENCOUNTER — Inpatient Hospital Stay (HOSPITAL_COMMUNITY)
Admission: EM | Admit: 2013-02-14 | Discharge: 2013-02-16 | DRG: 194 | Disposition: A | Discharge status: Home Health | Payer: Medicare Other | Attending: Internal Medicine | Admitting: Internal Medicine

## 2013-02-14 ENCOUNTER — Emergency Department (HOSPITAL_COMMUNITY): Payer: Medicare Other

## 2013-02-14 ENCOUNTER — Inpatient Hospital Stay (HOSPITAL_COMMUNITY): Payer: Medicare Other

## 2013-02-14 ENCOUNTER — Encounter (HOSPITAL_COMMUNITY): Payer: Self-pay | Admitting: *Deleted

## 2013-02-14 DIAGNOSIS — I498 Other specified cardiac arrhythmias: Secondary | ICD-10-CM | POA: Diagnosis present

## 2013-02-14 DIAGNOSIS — I5022 Chronic systolic (congestive) heart failure: Secondary | ICD-10-CM | POA: Diagnosis present

## 2013-02-14 DIAGNOSIS — E039 Hypothyroidism, unspecified: Secondary | ICD-10-CM | POA: Diagnosis present

## 2013-02-14 DIAGNOSIS — I509 Heart failure, unspecified: Secondary | ICD-10-CM | POA: Diagnosis present

## 2013-02-14 DIAGNOSIS — M109 Gout, unspecified: Secondary | ICD-10-CM | POA: Diagnosis present

## 2013-02-14 DIAGNOSIS — Z79899 Other long term (current) drug therapy: Secondary | ICD-10-CM

## 2013-02-14 DIAGNOSIS — Z95810 Presence of automatic (implantable) cardiac defibrillator: Secondary | ICD-10-CM | POA: Diagnosis present

## 2013-02-14 DIAGNOSIS — Z602 Problems related to living alone: Secondary | ICD-10-CM

## 2013-02-14 DIAGNOSIS — J189 Pneumonia, unspecified organism: Principal | ICD-10-CM | POA: Diagnosis present

## 2013-02-14 DIAGNOSIS — M5380 Other specified dorsopathies, site unspecified: Secondary | ICD-10-CM | POA: Diagnosis present

## 2013-02-14 DIAGNOSIS — R079 Chest pain, unspecified: Secondary | ICD-10-CM | POA: Diagnosis present

## 2013-02-14 DIAGNOSIS — I255 Ischemic cardiomyopathy: Secondary | ICD-10-CM | POA: Diagnosis present

## 2013-02-14 DIAGNOSIS — I2589 Other forms of chronic ischemic heart disease: Secondary | ICD-10-CM | POA: Diagnosis present

## 2013-02-14 DIAGNOSIS — Z23 Encounter for immunization: Secondary | ICD-10-CM

## 2013-02-14 DIAGNOSIS — I2510 Atherosclerotic heart disease of native coronary artery without angina pectoris: Secondary | ICD-10-CM | POA: Diagnosis present

## 2013-02-14 DIAGNOSIS — Z951 Presence of aortocoronary bypass graft: Secondary | ICD-10-CM

## 2013-02-14 DIAGNOSIS — E785 Hyperlipidemia, unspecified: Secondary | ICD-10-CM | POA: Diagnosis present

## 2013-02-14 DIAGNOSIS — Z9861 Coronary angioplasty status: Secondary | ICD-10-CM

## 2013-02-14 DIAGNOSIS — I6529 Occlusion and stenosis of unspecified carotid artery: Secondary | ICD-10-CM | POA: Diagnosis present

## 2013-02-14 DIAGNOSIS — Z7982 Long term (current) use of aspirin: Secondary | ICD-10-CM

## 2013-02-14 DIAGNOSIS — I252 Old myocardial infarction: Secondary | ICD-10-CM

## 2013-02-14 DIAGNOSIS — Z8249 Family history of ischemic heart disease and other diseases of the circulatory system: Secondary | ICD-10-CM

## 2013-02-14 DIAGNOSIS — I447 Left bundle-branch block, unspecified: Secondary | ICD-10-CM | POA: Diagnosis present

## 2013-02-14 LAB — CBC
Hemoglobin: 15.7 g/dL (ref 13.0–17.0)
MCH: 33.3 pg (ref 26.0–34.0)
MCHC: 35.5 g/dL (ref 30.0–36.0)
MCV: 93.6 fL (ref 78.0–100.0)

## 2013-02-14 LAB — PRO B NATRIURETIC PEPTIDE: Pro B Natriuretic peptide (BNP): 2711 pg/mL — ABNORMAL HIGH (ref 0–125)

## 2013-02-14 LAB — COMPREHENSIVE METABOLIC PANEL
AST: 17 U/L (ref 0–37)
BUN: 23 mg/dL (ref 6–23)
CO2: 22 mEq/L (ref 19–32)
Calcium: 9.4 mg/dL (ref 8.4–10.5)
Creatinine, Ser: 1.23 mg/dL (ref 0.50–1.35)
GFR calc Af Amer: 75 mL/min — ABNORMAL LOW (ref >90)
GFR calc non Af Amer: 64 mL/min — ABNORMAL LOW (ref >90)
Glucose, Bld: 96 mg/dL (ref 70–99)

## 2013-02-14 LAB — TROPONIN I: Troponin I: <0.3 ng/mL (ref <0.30)

## 2013-02-14 LAB — URINALYSIS, ROUTINE W REFLEX MICROSCOPIC
Nitrite: NEGATIVE
Protein, ur: NEGATIVE mg/dL
Specific Gravity, Urine: 1.017 (ref 1.005–1.030)
Urobilinogen, UA: 0.2 mg/dL (ref 0.0–1.0)

## 2013-02-14 LAB — URINE MICROSCOPIC-ADD ON

## 2013-02-14 LAB — INFLUENZA PANEL BY PCR (TYPE A & B)
H1N1 flu by pcr: NOT DETECTED
Influenza A By PCR: NEGATIVE
Influenza B By PCR: NEGATIVE

## 2013-02-14 LAB — PROTIME-INR
INR: 1.05 (ref 0.00–1.49)
Prothrombin Time: 13.6 seconds (ref 11.6–15.2)

## 2013-02-14 LAB — STREP PNEUMONIAE URINARY ANTIGEN: Strep Pneumo Urinary Antigen: NEGATIVE

## 2013-02-14 LAB — CG4 I-STAT (LACTIC ACID): Lactic Acid, Venous: 1.02 mmol/L (ref 0.5–2.2)

## 2013-02-14 MED ORDER — ACETAMINOPHEN 325 MG PO TABS
650.0000 mg | ORAL_TABLET | Freq: Once | ORAL | Status: AC
  Administered 2013-02-14: 650 mg via ORAL
  Filled 2013-02-14: qty 2

## 2013-02-14 MED ORDER — SIMVASTATIN 40 MG PO TABS
40.0000 mg | ORAL_TABLET | Freq: Every day | ORAL | Status: DC
  Administered 2013-02-14 – 2013-02-15 (×2): 40 mg via ORAL
  Filled 2013-02-14 (×3): qty 1

## 2013-02-14 MED ORDER — DEXTROSE 5 % IV SOLN: 1.0000 g | Freq: Once | INTRAVENOUS | Status: DC

## 2013-02-14 MED ORDER — OMEPRAZOLE MAGNESIUM 20 MG PO TBEC: 20.0000 mg | DELAYED_RELEASE_TABLET | Freq: Every day | ORAL | Status: DC

## 2013-02-14 MED ORDER — DEXTROSE 5 % IV SOLN
1.0000 g | INTRAVENOUS | Status: DC
  Administered 2013-02-14: 1 g via INTRAVENOUS
  Filled 2013-02-14 (×2): qty 10

## 2013-02-14 MED ORDER — ACETAMINOPHEN 325 MG PO TABS
650.0000 mg | ORAL_TABLET | Freq: Four times a day (QID) | ORAL | Status: DC | PRN
  Administered 2013-02-15 – 2013-02-16 (×2): 650 mg via ORAL
  Filled 2013-02-14 (×3): qty 2

## 2013-02-14 MED ORDER — SODIUM CHLORIDE 0.9 % IV SOLN: 250.0000 mL | INTRAVENOUS | Status: DC | PRN

## 2013-02-14 MED ORDER — GUAIFENESIN-DM 100-10 MG/5ML PO SYRP
5.0000 mL | ORAL_SOLUTION | ORAL | Status: DC | PRN
Start: 2013-02-14 — End: 2013-02-16

## 2013-02-14 MED ORDER — KETOROLAC TROMETHAMINE 30 MG/ML IJ SOLN
15.0000 mg | Freq: Once | INTRAMUSCULAR | Status: AC
  Administered 2013-02-14: 15 mg via INTRAVENOUS
  Filled 2013-02-14: qty 1

## 2013-02-14 MED ORDER — FUROSEMIDE 20 MG PO TABS
20.0000 mg | ORAL_TABLET | Freq: Every day | ORAL | Status: DC
  Administered 2013-02-15 – 2013-02-16 (×2): 20 mg via ORAL
  Filled 2013-02-14 (×3): qty 1

## 2013-02-14 MED ORDER — DEXTROSE 5 % IV SOLN
500.0000 mg | INTRAVENOUS | Status: DC
  Filled 2013-02-14: qty 500

## 2013-02-14 MED ORDER — SODIUM CHLORIDE 0.9 % IJ SOLN: 3.0000 mL | INTRAMUSCULAR | Status: DC | PRN

## 2013-02-14 MED ORDER — ASPIRIN 81 MG PO TABS: 81.0000 mg | ORAL_TABLET | Freq: Every day | ORAL | Status: DC

## 2013-02-14 MED ORDER — HEPARIN SODIUM (PORCINE) 5000 UNIT/ML IJ SOLN
5000.0000 [IU] | Freq: Three times a day (TID) | INTRAMUSCULAR | Status: DC
  Administered 2013-02-14 – 2013-02-16 (×6): 5000 [IU] via SUBCUTANEOUS
  Filled 2013-02-14 (×9): qty 1

## 2013-02-14 MED ORDER — CEFTRIAXONE SODIUM 1 G IJ SOLR
1.0000 g | Freq: Once | INTRAMUSCULAR | Status: DC
  Filled 2013-02-14: qty 10

## 2013-02-14 MED ORDER — ALLOPURINOL 100 MG PO TABS
100.0000 mg | ORAL_TABLET | Freq: Every day | ORAL | Status: DC
  Administered 2013-02-14 – 2013-02-16 (×3): 100 mg via ORAL
  Filled 2013-02-14 (×3): qty 1

## 2013-02-14 MED ORDER — DEXTROSE 5 % IV SOLN
500.0000 mg | Freq: Once | INTRAVENOUS | Status: AC
  Administered 2013-02-14: 500 mg via INTRAVENOUS
  Filled 2013-02-14: qty 500

## 2013-02-14 MED ORDER — PANTOPRAZOLE SODIUM 40 MG PO TBEC
40.0000 mg | DELAYED_RELEASE_TABLET | Freq: Every day | ORAL | Status: DC
  Administered 2013-02-15 – 2013-02-16 (×2): 40 mg via ORAL
  Filled 2013-02-14 (×3): qty 1

## 2013-02-14 MED ORDER — SODIUM CHLORIDE 0.9 % IV BOLUS (SEPSIS)
1000.0000 mL | Freq: Once | INTRAVENOUS | Status: AC
  Administered 2013-02-14: 1000 mL via INTRAVENOUS

## 2013-02-14 MED ORDER — METOPROLOL SUCCINATE ER 50 MG PO TB24
50.0000 mg | ORAL_TABLET | Freq: Every day | ORAL | Status: DC
  Administered 2013-02-14 – 2013-02-16 (×3): 50 mg via ORAL
  Filled 2013-02-14 (×3): qty 1

## 2013-02-14 MED ORDER — IOHEXOL 300 MG/ML  SOLN
100.0000 mL | Freq: Once | INTRAMUSCULAR | Status: AC | PRN
  Administered 2013-02-14: 100 mL via INTRAVENOUS

## 2013-02-14 MED ORDER — ISOSORBIDE MONONITRATE ER 30 MG PO TB24
30.0000 mg | ORAL_TABLET | Freq: Every day | ORAL | Status: DC
  Administered 2013-02-14 – 2013-02-16 (×3): 30 mg via ORAL
  Filled 2013-02-14 (×3): qty 1

## 2013-02-14 MED ORDER — ASPIRIN 81 MG PO CHEW
81.0000 mg | CHEWABLE_TABLET | Freq: Every day | ORAL | Status: DC
  Administered 2013-02-15 – 2013-02-16 (×2): 81 mg via ORAL
  Filled 2013-02-14 (×2): qty 1

## 2013-02-14 MED ORDER — LEVOTHYROXINE SODIUM 125 MCG PO TABS
125.0000 ug | ORAL_TABLET | Freq: Every day | ORAL | Status: DC
  Administered 2013-02-15 – 2013-02-16 (×2): 125 ug via ORAL
  Filled 2013-02-14 (×3): qty 1

## 2013-02-14 MED ORDER — SODIUM CHLORIDE 0.9 % IJ SOLN
3.0000 mL | Freq: Two times a day (BID) | INTRAMUSCULAR | Status: DC
  Administered 2013-02-14: 3 mL via INTRAVENOUS

## 2013-02-14 MED ORDER — HYDRALAZINE HCL 25 MG PO TABS
12.5000 mg | ORAL_TABLET | Freq: Two times a day (BID) | ORAL | Status: DC
  Administered 2013-02-14 – 2013-02-16 (×4): 12.5 mg via ORAL
  Filled 2013-02-14 (×5): qty 0.5

## 2013-02-14 NOTE — ED Notes (Signed)
Patient transported to CT 

## 2013-02-14 NOTE — ED Notes (Signed)
Lactic acid results shown to Dr. Plunkett 

## 2013-02-14 NOTE — H&P (Signed)
Hospital Admission Note Date: 02/14/2013  Patient name: Adam Gillespie Medical record number: [id number] Date of birth: January 28, 1956 Age: 57 y.o. Gender: male PCP: [name]  Medical Service: Internal Medicine Teaching Service--[facility]  Attending physician: Dr. Rogelia Boga    1st Contact: Dr. Burtis Junes   NFAOZ:[id number] 2nd Contact: Dr. Dierdre Searles    IONGE:[id number] After 5 pm or weekends: 1st Contact:      Pager: [phone] 2nd Contact:      Pager: [phone]  Chief Complaint: Chest pain  History of Present Illness: Mr. Holzhauer is a 57 year old white male with PMH of ischemic cardiomyopathy s/p MI and CABG x5 stents, b/l carortid artery stenosis, CHF EF 26% per July 2013 echo and defibrillator in place , and hypothyroidism presenting to ED today with mother and grand-daughter for complaints of chest pain this morning.  His mother explains that around 6am this morning, Mr. Bowland walked up to her door telling her to call EMS due to left sided chest pain.  They also endorse him to complain of back pain x2 days especially after doing yard work the day before and now abdominal pain as well.  He has had a productive cough with sputum x1 week with baseline dyspnea but no worsening of shortness of breath, no fever or chills, N/V/D, hemotysis, hematemesis, melena, or any urinary complaints at this time.  He denies any sick contacts, no recent travel, and is not always compliant with home medications.  The chest pain has resolved at the time of our interview but complains of discomfort upon palpation under left breast and left lower abdomen. Pain is reproducible on palpation, non-radiating, not exacerbated with cough, deep inspiration, or movement.    Of note, he is very hard on hearing and has speech deficit but nor cognition and understanding per family.  Meds: Current Outpatient Rx  Name  Route  Sig  Dispense  Refill  . allopurinol (ZYLOPRIM) 100 MG tablet   Oral   Take 1 tablet (100 mg total) by mouth daily.   90 tablet   3   . aspirin 81 MG tablet   Oral   Take 81 mg by mouth daily.           . furosemide (LASIX) 20 MG tablet   Oral   Take 20 mg by mouth daily.         . hydrALAZINE (APRESOLINE) 25 MG tablet      1/2 tablet twice a day   90 tablet   2   . isosorbide mononitrate (IMDUR) 30 MG 24 hr tablet   Oral   Take 1 tablet (30 mg total) by mouth daily.   90 tablet   3   . levothyroxine (SYNTHROID, LEVOTHROID) 125 MCG tablet   Oral   Take 1 tablet (125 mcg total) by mouth daily.   90 tablet   3   . metoprolol succinate (TOPROL-XL) 50 MG 24 hr tablet   Oral   Take 1 tablet (50 mg total) by mouth daily.   90 tablet   3   . omeprazole (PRILOSEC OTC) 20 MG tablet   Oral   Take 1 tablet (20 mg total) by mouth daily.   90 tablet   3   . simvastatin (ZOCOR) 40 MG tablet   Oral   Take 1 tablet (40 mg total) by mouth at bedtime.   90 tablet   3   . VESICARE 5 MG tablet   Oral   Take 5 mg by mouth  daily.           Allergies: Allergies as of 02/14/2013  . (No Known Allergies)   Past Medical History  Diagnosis Date  . Anoxic encephalopathy     POSTOP FROM 2001  . LV dysfunction     EF is 25% per echo in July 2013  . Carotid stenosis, right     60-79% right, 40 - 59% left - needs dopplers in June 2014  . Hyperlipidemia   . Gout   . Hypothyroidism   . Ischemic cardiomyopathy     Remote MI with CABG x 5 in 2001; Myocardial perfusion imaging EF 26% old infarct anterior wall. 04/2010  . 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 Hearing deficit     Bilateral ears   Past Surgical History  Procedure Laterality Date  . Cardiac catheterization  08/05/2000    THERE IS ANTERIOR AND APICAL AKINESIS. EF IS ESTIMATED 20%, WITH INFEROBASILAR PORTIONS CONTRACTING REASONABLY WELL.  Marland Kitchen Coronary artery bypass graft      x5. lima to the lad, saphenous vein graft to the intermediate and obtuse mariginal, and a saphenous vein graft to the acute mariginal and distal  right coronary artery  . Cardiac defibrillator placement    . Colonoscopy with propofol N/A 01/09/2013    Procedure: COLONOSCOPY WITH PROPOFOL;  Surgeon: Shirley Friar, MD;  Location: [facility] ENDOSCOPY;  Service: Endoscopy;  Laterality: N/A;   Family History  Problem Relation Age of Onset  . Heart attack Mother   . Cancer Father    History   Social History  . Marital Status: Single    Spouse Name: N/A    Number of Children: N/A  . Years of Education: N/A   Occupational History  . Not on file.   Social History Main Topics  . Smoking status: Never Smoker   . Smokeless tobacco: Current User    Types: Chew  . Alcohol Use: No  . Drug Use: No  . Sexually Active: No   Other Topics Concern  . Not on file   Social History Narrative  . No narrative on file   Review of Systems:  Constitutional:  Denies fever, chills, diaphoresis, appetite change and fatigue.   HEENT:  Denies congestion, sore throat, rhinorrhea, sneezing, mouth sores, trouble swallowing, neck pain   Respiratory:  SOB, DOE, cough. Denies wheezing.   Cardiovascular:  Denies palpitations and leg swelling.   Gastrointestinal:  Abdominal pain.  Denies nausea, vomiting, diarrhea, constipation, blood in stool and abdominal distention.   Genitourinary:  Denies dysuria, urgency, frequency, hematuria, flank pain and difficulty urinating.   Musculoskeletal:  back pain, gait problem.  Denies myalgias, joint swelling, arthralgias.   Skin:  Denies pallor, rash and wound.   Neurological:  Denies dizziness, seizures, syncope, weakness, light-headedness, numbness and headaches.    Physical Exam: Blood pressure 99/51, pulse 84, temperature 100.6 F (38.1 C), temperature source Rectal, resp. rate 19, SpO2 95.00%. Vitals reviewed. General: resting in bed, NAD HEENT: PERRL, EOMI, no scleral icterus Cardiac: RRR, no rubs, murmurs or gallops. Tenderness to palpation under left breast.  Pulm: b/l crackles at bases Abd: soft,  tenderness to palpation LLQ, distended, BS present Ext: warm and well perfused, no pedal edema, +2dp b/l, bl scars, paraspinal tenderness, R>L, equivocal b/l straight leg raise test Neuro: alert and oriented X3, cranial nerves II-XII grossly intact, strength and sensation to light touch equal in bilateral upper and lower extremities, difficult to hear and slowed verbal response but  responds appropriately. Unsteady upon standing needs support.  Daughter reports shuffling gait.  Finger to nose testing slightly worse with left hand.    Lab results: Basic Metabolic Panel:  Recent Labs  95/62/13 0739  NA 136  K 4.7  CL 100  CO2 22  GLUCOSE 96  BUN 23  CREATININE 1.23  CALCIUM 9.4   Liver Function Tests:  Recent Labs  02/14/13 0739  AST 17  ALT 8  ALKPHOS 73  BILITOT 0.2*  PROT 7.6  ALBUMIN 2.9*   CBC:  Recent Labs  02/14/13 0719  WBC 15.4*  HGB 15.7  HCT 44.2  MCV 93.6  PLT 371   BNP:  Recent Labs  02/14/13 0720  PROBNP 2711.0*   Coagulation:  Recent Labs  02/14/13 0739  LABPROT 13.6  INR 1.05   Urinalysis:  Recent Labs  02/14/13 0956  COLORURINE YELLOW  LABSPEC 1.017  PHURINE 5.0  GLUCOSEU NEGATIVE  HGBUR NEGATIVE  BILIRUBINUR NEGATIVE  KETONESUR NEGATIVE  PROTEINUR NEGATIVE  UROBILINOGEN 0.2  NITRITE NEGATIVE  LEUKOCYTESUR SMALL*   Imaging results:  Ct Abdomen Pelvis W Contrast  02/14/2013  *RADIOLOGY REPORT*  Clinical Data: Chest pain.  Low back pain.  Evaluate for AAA.  CT ABDOMEN AND PELVIS WITH CONTRAST  Technique:  Multidetector CT imaging of the abdomen and pelvis was performed following the standard protocol during bolus administration of intravenous contrast.  Contrast: OMNIPAQUE IOHEXOL 300 MG/ML  SOLN  Comparison: Acute abdominal series 02/14/2013  Findings: Cardiomegaly.  Bilateral lower lobe atelectasis or infiltrates.  There is a small to moderate sized hiatal hernia.  No pleural effusions.  Liver, spleen, pancreas,  gallbladder, adrenals and kidneys are normal.  Descending colonic and sigmoid diverticulosis.  No active diverticulitis.  Small bowel and stomach are decompressed.  Aorta is normal caliber.  Distal calcifications.  No aneurysm or dissection.  Small left inguinal hernia containing fat.  Urinary bladder and prostate unremarkable.  No free fluid, free air or adenopathy.  No acute bony abnormality.  IMPRESSION: No evidence of aortic aneurysm or dissection.  Sigmoid and descending colonic diverticulosis.  Cardiomegaly.  Bilateral lower lobe atelectasis or consolidation.  Moderate hiatal hernia.   Original Report Authenticated By: Charlett Nose, M.D.    Dg Abd Acute W/chest  02/14/2013  *RADIOLOGY REPORT*  Clinical Data: Chest and abdominal pain.  ACUTE ABDOMEN SERIES (ABDOMEN 2 VIEW & CHEST 1 VIEW)  Comparison: 12/11/2004 chest x-ray.  Findings: Left AICD remains in place, age.  Cardiomegaly with low lung volumes, vascular congestion and bibasilar atelectasis.  Nonobstructive bowel gas pattern.  No free air.  No organomegaly or suspicious calcification.  No acute bony abnormality.  IMPRESSION: A nonobstructive bowel gas pattern.  No free air.  Cardiomegaly with low lung volumes, vascular congestion and bibasilar atelectasis.   Original Report Authenticated By: Charlett Nose, M.D.    Other results: EKG: 105bpm sinus tachycardia, qtc 497, artifacts in leads  Assessment & Plan by Problem: Mr. Dawood is a 56 year old male with CHF EF 26%, ischemic cardiomyopathy s/p CABG admitted for chest and abdominal pain.        Chest pain--possibly pleuritic in nature secondary to ?CAP.  CT abdomen showed b/l lower lobe atelectasis or consolidation on admission.  Pain could also be MSK given reproducibility with tenderness to palpation under left breast and doing yard work prior to admission.  Other differentials may include GERD (on Prilosec at home), esophageal spasm/perf (unlikely given resolution of pain but did have EGD  approximately 1 month prior).  PE is included in differential but modified geneva score 3 with low probability, no leg pain or recent travel, tachycardia resolved, and no worsening of SOB.  No evidence of aortic aneurysm or dissection on CT A/P.  We will still do ACS given risk factors of CAD and CHF with limited EF 26%, proBNP 2711, troponin i poc negative, and tobacco use.   -admit to tele -cycle CE -am EKG -risk stratification: TSH 1.26 (09/2012), HbA1C, Lipid panel: CHOL 139, HDL 36, LDL 75.9, VLDL 46.6, TG 233 -pneumococcal vaccine if not received in past 5 years -influenza pcr -AM labs: BMET, CBC -nitrostat prn -oxygen therapy prn, keep o2 sat >92% -influenza pcr -droplet precaution -morphine prn pain -blood cx x2 -ASA -Statin, BB  ?CAP:CT abdomen showed b/l lower lobe atelectasis or consolidation, productive cough, baseline SOB, temp of 100.68F, and leukocytosis (WBC 15.4) on admission. -f/u cxr -blood cx x2 -sputum cx -urine legionella and strep pneumoniae antigen -oxygen therapy prn, keep o2 sats >92% -continue Vancomycin and Azithromycin--day 1; first dose of azithromycin in ED given prior to blood culturs being drawn -trend wbc -tylenol prn fever    Chronic systolic congestive heart failure with ischemic cardiomyopathy: EF 25% LVEF with akinesis of the inferior, inferoseptal, distal anterior and apical walls, and hypokinesis with mild AR and MR.  Follows with [facility] cardiology.  S/p CABG and defibrillator in place.  proBNP 2711 on admission (could be chronic).  Home medications include: ASA, Lasix 20mg  qd, Hydralazine 12.5mg  BID, IMDUR 30mg  qd, Toprol-XL 50mg  qd, and Zocor 40mg  qhs.  -continue home medications    Hyperlipemia--Lipid panel 09/2012: CHOL 139, HDL 36, LDL 75.9, VLDL 46.6, TG 233.  On Zocor 40mg  qhs at home. -continue zocor    Hypothyroidism--TSH 1.26 on 09/2012.  On Synthroid at home.  -continue levothyroxine    Back pain--x2 days s/p yard work.   B/l lower back/hip pain with straight leg test.  Worse with movement.   -activity as tolerated -tylenol prn pain -warm compress to area -if worsening of symptoms or development of neurological deficits, will consider further imaging and work up    Diet: Regular   DVT Ppx: Heparin   Dispo: Disposition is deferred at this time, awaiting improvement of current medical problems. Anticipated discharge in approximately 2-3 day(s).   The patient does have a current PCP (CONROY,NATHAN, PA-C), therefore will not be requiring [facility] follow-up after discharge.   The patient does not have transportation limitations that hinder transportation to clinic appointments.  SignedDarden Palmer 02/14/2013, 12:56 PM

## 2013-02-14 NOTE — ED Provider Notes (Addendum)
History     CSN: [id number]  Arrival date & time 02/14/13  0714   First MD Initiated Contact with Patient 02/14/13 [phone]      Chief Complaint  Patient presents with  . Chest Pain    (Consider location/radiation/quality/duration/timing/severity/associated sxs/prior treatment) Patient is a 57 y.o. male presenting with chest pain and back pain. The history is provided by the patient and a parent.  Chest Pain Associated symptoms: back pain and cough   Associated symptoms: no fever, no headache, no numbness and no weakness   Back Pain Location:  Lumbar spine Quality:  Aching and cramping Radiates to:  Does not radiate Pain severity:  Moderate Pain is:  Same all the time Onset quality:  Sudden Duration:  24 hours Timing:  Constant Progression:  Unchanged Chronicity:  New Context comment:  States that over the last week has been mulching but last time he did any mulching was 2 days ago and no back pain at that time Relieved by:  Nothing Worsened by:  Nothing tried Ineffective treatments:  None tried Associated symptoms: chest pain   Associated symptoms: no bladder incontinence, no bowel incontinence, no dysuria, no fever, no headaches, no numbness, no paresthesias and no weakness   Associated symptoms comment:  Denies abd pain Chest pain:    Quality:  Aching and dull (States that the chest pain started this morning after he had severe back pain starting yesterday.)   Severity:  Mild   Onset quality:  Sudden   Duration:  2 hours   Timing:  Constant   Progression:  Resolved   Chronicity:  Recurrent Risk factors comment:  History of carotid stenosis, ischemic cardiomyopathy, non-Q wave MI, CABG   Past Medical History  Diagnosis Date  . Anoxic encephalopathy     POSTOP FROM 2001  . LV dysfunction     EF is 25% per echo in July 2013  . Carotid stenosis, right     60-79% right, 40 - 59% left - needs dopplers in June 2014  . Hyperlipidemia   . Gout   . Hypothyroidism   .  Ischemic cardiomyopathy     Remote MI with CABG x 5 in 2001; Myocardial perfusion imaging EF 26% old infarct anterior wall. 04/2010  . 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 Hearing deficit     Bilateral ears    Past Surgical History  Procedure Laterality Date  . Cardiac catheterization  08/05/2000    THERE IS ANTERIOR AND APICAL AKINESIS. EF IS ESTIMATED 20%, WITH INFEROBASILAR PORTIONS CONTRACTING REASONABLY WELL.  Marland Kitchen Coronary artery bypass graft      x5. lima to the lad, saphenous vein graft to the intermediate and obtuse mariginal, and a saphenous vein graft to the acute mariginal and distal right coronary artery  . Cardiac defibrillator placement    . Colonoscopy with propofol N/A 01/09/2013    Procedure: COLONOSCOPY WITH PROPOFOL;  Surgeon: Shirley Friar, MD;  Location: [facility];  Service: Endoscopy;  Laterality: N/A;    Family History  Problem Relation Age of Onset  . Heart attack Mother   . Cancer Father     History  Substance Use Topics  . Smoking status: Never Smoker   . Smokeless tobacco: Current User    Types: Chew  . Alcohol Use: No      Review of Systems  Constitutional: Negative for fever.  Respiratory: Positive for cough.   Cardiovascular: Positive for chest pain.  Gastrointestinal: Negative for bowel incontinence.  Genitourinary: Negative for bladder incontinence and dysuria.  Musculoskeletal: Positive for back pain.  Neurological: Negative for weakness, numbness, headaches and paresthesias.  All other systems reviewed and are negative.    Allergies  Review of patient's allergies indicates no known allergies.  Home Medications   Current Outpatient Rx  Name  Route  Sig  Dispense  Refill  . allopurinol (ZYLOPRIM) 100 MG tablet   Oral   Take 1 tablet (100 mg total) by mouth daily.   90 tablet   3   . aspirin 81 MG tablet   Oral   Take 81 mg by mouth daily.           . furosemide (LASIX) 20 MG tablet   Oral   Take 20 mg by  mouth daily.         . hydrALAZINE (APRESOLINE) 25 MG tablet      1/2 tablet twice a day   90 tablet   2   . isosorbide mononitrate (IMDUR) 30 MG 24 hr tablet   Oral   Take 1 tablet (30 mg total) by mouth daily.   90 tablet   3   . levothyroxine (SYNTHROID, LEVOTHROID) 125 MCG tablet   Oral   Take 1 tablet (125 mcg total) by mouth daily.   90 tablet   3   . metoprolol succinate (TOPROL-XL) 50 MG 24 hr tablet   Oral   Take 1 tablet (50 mg total) by mouth daily.   90 tablet   3   . omeprazole (PRILOSEC OTC) 20 MG tablet   Oral   Take 1 tablet (20 mg total) by mouth daily.   90 tablet   3   . simvastatin (ZOCOR) 40 MG tablet   Oral   Take 1 tablet (40 mg total) by mouth at bedtime.   90 tablet   3   . VESICARE 5 MG tablet   Oral   Take 5 mg by mouth daily.            BP 123/59  Pulse 94  Temp(Src) 100.6 F (38.1 C) (Rectal)  Resp 25  SpO2 96%  Physical Exam  Nursing note and vitals reviewed. Constitutional: He is oriented to person, place, and time. He appears well-developed and well-nourished. He appears distressed.  Appears uncomfortable  HENT:  Head: Normocephalic and atraumatic.  Mouth/Throat: Oropharynx is clear and moist. Mucous membranes are dry.  Eyes: Conjunctivae and EOM are normal. Pupils are equal, round, and reactive to light.  Neck: Normal range of motion. Neck supple.  Cardiovascular: Normal rate, regular rhythm and intact distal pulses.   No murmur heard. Pulmonary/Chest: Breath sounds normal. Tachypnea noted. No respiratory distress. He has no wheezes. He has no rales.  Abdominal: Soft. He exhibits no distension. There is tenderness. There is no rebound, no guarding and no CVA tenderness. No hernia.    Musculoskeletal: Normal range of motion. He exhibits no edema and no tenderness.       Lumbar back: He exhibits tenderness.       Back:  Feet are cool to the touch but <3 sec cap refill and 1+ DP and PT pulses palpated bilaterally    Neurological: He is alert and oriented to person, place, and time.  Skin: Skin is warm and dry. No rash noted. No erythema.  Psychiatric: He has a normal mood and affect. His behavior is normal.    ED Course  Procedures (including critical care time)  Labs Reviewed  CBC -  Abnormal; Notable for the following:    WBC 15.4 (*)    All other components within normal limits  PRO B NATRIURETIC PEPTIDE - Abnormal; Notable for the following:    Pro B Natriuretic peptide (BNP) 2711.0 (*)    All other components within normal limits  COMPREHENSIVE METABOLIC PANEL - Abnormal; Notable for the following:    Albumin 2.9 (*)    Total Bilirubin 0.2 (*)    GFR calc non Af Amer 64 (*)    GFR calc Af Amer 75 (*)    All other components within normal limits  URINALYSIS, ROUTINE W REFLEX MICROSCOPIC - Abnormal; Notable for the following:    Leukocytes, UA SMALL (*)    All other components within normal limits  PROTIME-INR  APTT  URINE MICROSCOPIC-ADD ON  CG4 I-STAT (LACTIC ACID)  POCT I-STAT TROPONIN I   Ct Abdomen Pelvis W Contrast  02/14/2013  *RADIOLOGY REPORT*  Clinical Data: Chest pain.  Low back pain.  Evaluate for AAA.  CT ABDOMEN AND PELVIS WITH CONTRAST  Technique:  Multidetector CT imaging of the abdomen and pelvis was performed following the standard protocol during bolus administration of intravenous contrast.  Contrast: OMNIPAQUE IOHEXOL 300 MG/ML  SOLN  Comparison: Acute abdominal series 02/14/2013  Findings: Cardiomegaly.  Bilateral lower lobe atelectasis or infiltrates.  There is a small to moderate sized hiatal hernia.  No pleural effusions.  Liver, spleen, pancreas, gallbladder, adrenals and kidneys are normal.  Descending colonic and sigmoid diverticulosis.  No active diverticulitis.  Small bowel and stomach are decompressed.  Aorta is normal caliber.  Distal calcifications.  No aneurysm or dissection.  Small left inguinal hernia containing fat.  Urinary bladder and prostate  unremarkable.  No free fluid, free air or adenopathy.  No acute bony abnormality.  IMPRESSION: No evidence of aortic aneurysm or dissection.  Sigmoid and descending colonic diverticulosis.  Cardiomegaly.  Bilateral lower lobe atelectasis or consolidation.  Moderate hiatal hernia.   Original Report Authenticated By: Charlett Nose, M.D.    Dg Abd Acute W/chest  02/14/2013  *RADIOLOGY REPORT*  Clinical Data: Chest and abdominal pain.  ACUTE ABDOMEN SERIES (ABDOMEN 2 VIEW & CHEST 1 VIEW)  Comparison: 12/11/2004 chest x-ray.  Findings: Left AICD remains in place, age.  Cardiomegaly with low lung volumes, vascular congestion and bibasilar atelectasis.  Nonobstructive bowel gas pattern.  No free air.  No organomegaly or suspicious calcification.  No acute bony abnormality.  IMPRESSION: A nonobstructive bowel gas pattern.  No free air.  Cardiomegaly with low lung volumes, vascular congestion and bibasilar atelectasis.   Original Report Authenticated By: Charlett Nose, M.D.      Date: 02/14/2013  Rate: 105  Rhythm: sinus tachycardia  QRS Axis: normal  Intervals: normal  ST/T Wave abnormalities: nonspecific ST/T changes  Conduction Disutrbances:nonspecific intraventricular conduction delay  Narrative Interpretation:   Old EKG Reviewed: unchanged    1. CAP (community acquired pneumonia)       MDM   Patient with back pain that started yesterday and then he states he developed chest pain this morning. He also states he has had a cough. He denies shortness of breath, nausea, vomiting, fever however on exam he does have lower abdominal pain as well. He denies any radiation of pain into his legs and is neurovascularly intact. Patient currently denies chest pain but has a significant history of CABG in 2001 as well as ischemic cardiomyopathy and defibrillator placement.  Concern for possible AAA today however not completely ruptured as  patient is hemodynamically stable. He was mildly hypoxic at 88% on arrival  today and does not wear oxygen regularly. Also concern for cardiac versus lung pathology given chest pain and hypoxia.  Patient has some mental deficits including hearing problems the mother states he is acting his normal self. Bedside ultrasound showed an enlarged aorta however difficult to see definitive borders to measure. We'll get a CT to further evaluate. CBC, BMP, CMP, UA, INR, PTT, lactic acid,  acute abdominal series pending.  EKG is unchanged.  9:34 AM Rectal temperature 100.6. Will give patient Tylenol. CBC with a stable hemoglobin the white count of 15,000. Troponin and lactic acid are within normal limits.  Acute abdominal series shows cardiomegaly with low lung volumes and vascular congestion but otherwise no free air or obstructive bowel pattern.  Due to ultrasound findings we'll do a CT to ensure that AAA is not the cause of patient's symptoms today. Patient also could have diverticulitis or perforated bowel. However he does not have peritoneal signs.   11:34 AM CT of the abdomen was negative for any aortic pathology however it showed bilateral lower lobe infiltrates/consolidation which would explain patient's fever, leukocytosis and hypoxia.  This would be community acquired pneumonia and he was given Rocephin and Zithromax. Due to the hypoxia we'll admit for further care  Gwyneth Sprout, MD 02/14/13 1136  Gwyneth Sprout, MD 02/14/13 1142

## 2013-02-14 NOTE — Progress Notes (Signed)
Utilization Review completed.  Melquiades Kovar RN CM  

## 2013-02-14 NOTE — ED Notes (Signed)
To ED via [facility] for eval of CP that woke him this am at 4am. Described as tightness. 324mg  ASA and 2 nitro given PTA. Now only complaint is lower back pain.

## 2013-02-14 NOTE — ED Notes (Signed)
Spoke with lab regarding lab results and they state 'it's still on machine, shouldn't be that much longer'.

## 2013-02-15 DIAGNOSIS — J189 Pneumonia, unspecified organism: Principal | ICD-10-CM

## 2013-02-15 LAB — BASIC METABOLIC PANEL
CO2: 26 mEq/L (ref 19–32)
Chloride: 103 mEq/L (ref 96–112)
Creatinine, Ser: 1.38 mg/dL — ABNORMAL HIGH (ref 0.50–1.35)

## 2013-02-15 LAB — CBC
HCT: 38.6 % — ABNORMAL LOW (ref 39.0–52.0)
MCV: 94.4 fL (ref 78.0–100.0)
RDW: 14.1 % (ref 11.5–15.5)
WBC: 11.1 10*3/uL — ABNORMAL HIGH (ref 4.0–10.5)

## 2013-02-15 LAB — LEGIONELLA ANTIGEN, URINE

## 2013-02-15 LAB — HIV ANTIBODY (ROUTINE TESTING W REFLEX): HIV: NONREACTIVE

## 2013-02-15 MED ORDER — GUAIFENESIN-DM 100-10 MG/5ML PO SYRP: 5.0000 mL | ORAL_SOLUTION | Freq: Three times a day (TID) | ORAL | Status: DC | PRN

## 2013-02-15 MED ORDER — KETOROLAC TROMETHAMINE 15 MG/ML IJ SOLN
15.0000 mg | Freq: Three times a day (TID) | INTRAMUSCULAR | Status: DC | PRN
  Administered 2013-02-15: 15 mg via INTRAVENOUS
  Filled 2013-02-15: qty 1

## 2013-02-15 MED ORDER — AZITHROMYCIN 500 MG PO TABS
500.0000 mg | ORAL_TABLET | Freq: Every day | ORAL | Status: DC
  Administered 2013-02-16: 500 mg via ORAL
  Filled 2013-02-15 (×2): qty 1

## 2013-02-15 MED ORDER — AZITHROMYCIN 500 MG PO TABS: 500.0000 mg | ORAL_TABLET | Freq: Every day | ORAL | Status: DC

## 2013-02-15 MED ORDER — LEVOFLOXACIN 500 MG PO TABS
500.0000 mg | ORAL_TABLET | Freq: Every day | ORAL | Status: DC
  Administered 2013-02-15: 500 mg via ORAL
  Filled 2013-02-15: qty 1

## 2013-02-15 MED ORDER — LEVOFLOXACIN 500 MG PO TABS: 500.0000 mg | ORAL_TABLET | Freq: Every day | ORAL | Status: DC

## 2013-02-15 MED ORDER — MELOXICAM 7.5 MG PO TABS: 7.5000 mg | ORAL_TABLET | Freq: Every day | ORAL | Status: DC

## 2013-02-15 NOTE — Progress Notes (Signed)
Pls discard. Note charted in error, for patient not going home at this time.

## 2013-02-15 NOTE — Discharge Summary (Signed)
Internal Medicine Teaching [facility] Discharge Note  Name: Adam Gillespie MRN: [id number] DOB: 1956/06/11 57 y.o.  Date of Admission: 02/14/2013  7:14 AM Date of Discharge: 02/15/2013 Attending Physician: Lars Mage, MD  Discharge Diagnosis: Principal Problem:   Chest pain Active Problems:   Ischemic cardiomyopathy   Hyperlipemia   Hypothyroidism   Chronic systolic congestive heart failure   Automatic implantable cardiac defibrillator -[employer]   CAD (coronary artery disease)   CAP (community acquired pneumonia)   Discharge Medications:   Medication List    ASK your doctor about these medications       allopurinol 100 MG tablet  Commonly known as:  ZYLOPRIM  Take 1 tablet (100 mg total) by mouth daily.     aspirin 81 MG tablet  Take 81 mg by mouth daily.     furosemide 20 MG tablet  Commonly known as:  LASIX  Take 20 mg by mouth daily.     hydrALAZINE 25 MG tablet  Commonly known as:  APRESOLINE  1/2 tablet twice a day     isosorbide mononitrate 30 MG 24 hr tablet  Commonly known as:  IMDUR  Take 1 tablet (30 mg total) by mouth daily.     levothyroxine 125 MCG tablet  Commonly known as:  SYNTHROID, LEVOTHROID  Take 1 tablet (125 mcg total) by mouth daily.     metoprolol succinate 50 MG 24 hr tablet  Commonly known as:  TOPROL-XL  Take 1 tablet (50 mg total) by mouth daily.     omeprazole 20 MG tablet  Commonly known as:  PRILOSEC OTC  Take 1 tablet (20 mg total) by mouth daily.     simvastatin 40 MG tablet  Commonly known as:  ZOCOR  Take 1 tablet (40 mg total) by mouth at bedtime.     VESICARE 5 MG tablet  Generic drug:  solifenacin  Take 5 mg by mouth daily.        Disposition and follow-up:   AdamAhren R Croom was discharged from [facility] in Stable condition.  At the hospital follow up visit please address back spasm and resolution of PNA with CXR in 6 wks.   Follow-up Appointments:       Future Appointments  Provider Department Dept Phone   04/04/2013 10:00 AM [name], NP [facility] [place]) [phone]   04/04/2013 11:00 AM [facility] [phone]      Procedures Performed:  Dg Chest 2 View  02/14/2013  *RADIOLOGY REPORT*  Clinical Data: Chest pain.  Generalized weakness.  Back pain. Indwelling pacing defibrillator.  CHEST - 2 VIEW  Comparison: One-view chest x-ray earlier same date 0753 hours.  Two- view chest x-ray 12/11/2004.  Findings: Left subclavian pacing defibrillator, with the venous lead having withdrawn minimally since 2006, still appropriately positioned.  Prior sternotomy for CABG.  Cardiac silhouette enlarged but stable.  Pulmonary vascularity normal without evidence of pulmonary edema.  Linear atelectasis in the right lower lobe. Patchy airspace opacities in the left lower lobe.  Lungs otherwise clear.  No pleural effusions.  Visualized bony thorax intact.  IMPRESSION: Stable cardiomegaly without pulmonary edema.  Patchy pneumonia suspected in the left lower lobe.  Linear atelectasis in the right lower lobe.   Original Report Authenticated By: Hulan Saas, M.D.    Ct Abdomen Pelvis W Contrast  02/14/2013  *RADIOLOGY REPORT*  Clinical Data: Chest pain.  Low back pain.  Evaluate for AAA.  CT ABDOMEN AND PELVIS  WITH CONTRAST  Technique:  Multidetector CT imaging of the abdomen and pelvis was performed following the standard protocol during bolus administration of intravenous contrast.  Contrast: OMNIPAQUE IOHEXOL 300 MG/ML  SOLN  Comparison: Acute abdominal series 02/14/2013  Findings: Cardiomegaly.  Bilateral lower lobe atelectasis or infiltrates.  There is a small to moderate sized hiatal hernia.  No pleural effusions.  Liver, spleen, pancreas, gallbladder, adrenals and kidneys are normal.  Descending colonic and sigmoid diverticulosis.  No active diverticulitis.  Small bowel and stomach are decompressed.  Aorta is normal caliber.  Distal  calcifications.  No aneurysm or dissection.  Small left inguinal hernia containing fat.  Urinary bladder and prostate unremarkable.  No free fluid, free air or adenopathy.  No acute bony abnormality.  IMPRESSION: No evidence of aortic aneurysm or dissection.  Sigmoid and descending colonic diverticulosis.  Cardiomegaly.  Bilateral lower lobe atelectasis or consolidation.  Moderate hiatal hernia.   Original Report Authenticated By: Charlett Nose, M.D.    Dg Abd Acute W/chest  02/14/2013  *RADIOLOGY REPORT*  Clinical Data: Chest and abdominal pain.  ACUTE ABDOMEN SERIES (ABDOMEN 2 VIEW & CHEST 1 VIEW)  Comparison: 12/11/2004 chest x-ray.  Findings: Left AICD remains in place, age.  Cardiomegaly with low lung volumes, vascular congestion and bibasilar atelectasis.  Nonobstructive bowel gas pattern.  No free air.  No organomegaly or suspicious calcification.  No acute bony abnormality.  IMPRESSION: A nonobstructive bowel gas pattern.  No free air.  Cardiomegaly with low lung volumes, vascular congestion and bibasilar atelectasis.   Original Report Authenticated By: Charlett Nose, M.D.     Admission HPI: Adam Gillespie is a 57 year old white male with PMH of ischemic cardiomyopathy s/p MI and CABG x5 stents, b/l carortid artery stenosis, CHF EF 26% per July 2013 echo and defibrillator in place , and hypothyroidism presenting to ED today with mother and grand-daughter for complaints of chest pain this morning. His mother explains that around 6am this morning, Mr. Oakley walked up to her door telling her to call EMS due to left sided chest pain. They also endorse him to complain of back pain x2 days especially after doing yard work the day before and now abdominal pain as well. He has had a productive cough with sputum x1 week with baseline dyspnea but no worsening of shortness of breath, no fever or chills, N/V/D, hemotysis, hematemesis, melena, or any urinary complaints at this time. He denies any sick contacts, no  recent travel, and is not always compliant with home medications. The chest pain has resolved at the time of our interview but complains of discomfort upon palpation under left breast and left lower abdomen. Pain is reproducible on palpation, non-radiating, not exacerbated with cough, deep inspiration, or movement.    Hospital Course by problem list: 1. Chest pain 2/2 CAP: Pt has extensive cardiac hx with CABG and defibrillator. ACS ruled out with serial EKGs that show no acute ischemic changes, troponins negative. Aortic anuerysm and AAA also r/o with CTAbd completed in ED. CXR concerning for new bilateral LL infiltrates and CT abdomen showed bilateral LLL. Pt CURB-65 score 0. Originally met SIRS criteria but resolved by discharge. S. pneumo, legionella, blood cultures and influenza all negative. Pt was origionally treated with IV azithromycin and IV Vanc this was transitioned to oral azithromycin 2/2 financial concerns in regards to levofloxacin. Pt was d/c with 5 day course of azithromycin.   2. 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 No concerning neurological findings or red flag symptoms to further warrant imaging. His pain was controlled on toradol and underwent PT evaluation that relvealed no need for further intervention. Pt was given short course of Mobic and f/u with PCP.   3. Chronic systolic heart failure: last echo 6/13 showed EF 25% with akinesis of infereior/inferospetal/anterior/apical walls. No signs of acute exacerbation at this time. Pt was continued on ASA, lasix 20mg  qd, hydralazine 12.5mg  BID, IMDUR 30mg  qd, and toprol XL 50mg  qd.   Discharge Vitals:  BP 129/69  Pulse 75  Temp(Src) 99.2 F (37.3 C) (Oral)  Resp 18  Ht 5\' 4"  (1.626 m)  Wt 140 lb 14 oz (63.9 kg)  BMI 24.17 kg/m2  SpO2 98% General: resting in bed, NAD  HEENT: PERRL, EOMI, no scleral icterus  Cardiac:  RRR, no rubs, murmurs or gallops  Pulm: slight decreased BS bilaterally limited by pain, slight crackles in LLL, no wheezes or rhonchi  Abd: soft, nontender, nondistended, BS present  Ext: warm and well perfused, no pedal edema  Neuro: alert and oriented X3, cranial nerves II-XII grossly intact  Discharge Labs:  Results for orders placed during the hospital encounter of 02/14/13 (from the past 24 hour(s))  CULTURE, BLOOD (ROUTINE X 2)     Status: None   Collection Time    02/14/13  3:10 PM      Result Value Range   Specimen Description BLOOD LEFT HAND     Special Requests BOTTLES DRAWN AEROBIC ONLY 3CC     Culture  Setup Time 02/14/2013 22:52     Culture       Value:        BLOOD CULTURE RECEIVED NO GROWTH TO DATE CULTURE WILL BE HELD FOR 5 DAYS BEFORE ISSUING A FINAL NEGATIVE REPORT   Report Status PENDING    INFLUENZA PANEL BY PCR     Status: None   Collection Time    02/14/13  3:16 PM      Result Value Range   Influenza A By PCR NEGATIVE  NEGATIVE   Influenza B By PCR NEGATIVE  NEGATIVE   H1N1 flu by pcr NOT DETECTED  NOT DETECTED  CULTURE, BLOOD (ROUTINE X 2)     Status: None   Collection Time    02/14/13  3:20 PM      Result Value Range   Specimen Description BLOOD LEFT ARM     Special Requests BOTTLES DRAWN AEROBIC ONLY 3CC     Culture  Setup Time 02/14/2013 22:52     Culture       Value:        BLOOD CULTURE RECEIVED NO GROWTH TO DATE CULTURE WILL BE HELD FOR 5 DAYS BEFORE ISSUING A FINAL NEGATIVE REPORT   Report Status PENDING    HIV ANTIBODY (ROUTINE TESTING)     Status: None   Collection Time    02/14/13  3:31 PM      Result Value Range   HIV NON REACTIVE  NON REACTIVE  TROPONIN I     Status: None   Collection Time    02/14/13  3:32 PM      Result Value Range   Troponin I <0.30  <0.30 ng/mL  STREP PNEUMONIAE URINARY ANTIGEN     Status: None   Collection Time    02/14/13  3:45 PM      Result Value Range   Strep  Pneumo Urinary Antigen NEGATIVE  NEGATIVE   TROPONIN I     Status: None   Collection Time    02/14/13  6:19 PM      Result Value Range   Troponin I <0.30  <0.30 ng/mL  BASIC METABOLIC PANEL     Status: Abnormal   Collection Time    02/15/13  6:00 AM      Result Value Range   Sodium 137  135 - 145 mEq/L   Potassium 4.5  3.5 - 5.1 mEq/L   Chloride 103  96 - 112 mEq/L   CO2 26  19 - 32 mEq/L   Glucose, Bld 86  70 - 99 mg/dL   BUN 26 (*) 6 - 23 mg/dL   Creatinine, Ser 0.98 (*) 0.50 - 1.35 mg/dL   Calcium 8.9  8.4 - 11.9 mg/dL   GFR calc non Af Amer 56 (*) >90 mL/min   GFR calc Af Amer 65 (*) >90 mL/min  CBC     Status: Abnormal   Collection Time    02/15/13  6:00 AM      Result Value Range   WBC 11.1 (*) 4.0 - 10.5 K/uL   RBC 4.09 (*) 4.22 - 5.81 MIL/uL   Hemoglobin 13.4  13.0 - 17.0 g/dL   HCT 14.7 (*) 82.9 - 56.2 %   MCV 94.4  78.0 - 100.0 fL   MCH 32.8  26.0 - 34.0 pg   MCHC 34.7  30.0 - 36.0 g/dL   RDW 13.0  86.5 - 78.4 %   Platelets 348  150 - 400 K/uL    Signed: Christen Bame 02/15/2013, 10:34 AM   Time Spent on Discharge: 45 min Services Ordered on Discharge: home health RN Equipment Ordered on Discharge: none

## 2013-02-15 NOTE — Progress Notes (Signed)
SATURATION QUALIFICATIONS: (This note is used to comply with regulatory documentation for home oxygen)  Patient Saturations on Room Air at Rest = 98%  Patient Saturations on Room Air while Ambulating = 98%  Patient Saturations on 2 Liters of oxygen while Ambulating = 100%  Please briefly explain why patient needs home oxygen:Patient does not need home oxygen at this time and MD is aware.

## 2013-02-15 NOTE — Progress Notes (Signed)
Subjective: Pt doing well this AM. Some resolution of CP. Some back spasms overnight with ambulation to chair and in room. Tolerating full diet, no n/v/d overnight. VSS stable.   Objective: Vital signs in last 24 hours: Filed Vitals:   02/14/13 1500 02/14/13 2106 02/15/13 0442 02/15/13 1012  BP: 113/70 115/64 117/59 129/69  Pulse: 98 69 71 75  Temp: 98.5 F (36.9 C) 98.2 F (36.8 C) 98.8 F (37.1 C) 99.2 F (37.3 C)  TempSrc: Oral Oral Oral Oral  Resp: 22 18 19 18   Height: 5\' 4"  (1.626 m)     Weight: 140 lb 3.4 oz (63.6 kg)  140 lb 14 oz (63.9 kg)   SpO2: 98% 100% 99% 98%   Weight change:   Intake/Output Summary (Last 24 hours) at 02/15/13 1015 Last data filed at 02/15/13 0841  Gross per 24 hour  Intake   1600 ml  Output    250 ml  Net   1350 ml   General: resting in bed, NAD HEENT: PERRL, EOMI, no scleral icterus Cardiac: RRR, no rubs, murmurs or gallops Pulm: slight decreased BS bilaterally limited by pain, slight crackles in LLL, no wheezes or rhonchi Abd: soft, nontender, nondistended, BS present Ext: warm and well perfused, no pedal edema Neuro: alert and oriented X3, cranial nerves II-XII grossly intact  Lab Results: Basic Metabolic Panel:  Recent Labs Lab 02/14/13 0739 02/15/13 0600  NA 136 137  K 4.7 4.5  CL 100 103  CO2 22 26  GLUCOSE 96 86  BUN 23 26*  CREATININE 1.23 1.38*  CALCIUM 9.4 8.9   Liver Function Tests:  Recent Labs Lab 02/14/13 0739  AST 17  ALT 8  ALKPHOS 73  BILITOT 0.2*  PROT 7.6  ALBUMIN 2.9*   CBC:  Recent Labs Lab 02/14/13 0719 02/15/13 0600  WBC 15.4* 11.1*  HGB 15.7 13.4  HCT 44.2 38.6*  MCV 93.6 94.4  PLT 371 348   Cardiac Enzymes:  Recent Labs Lab 02/14/13 1532 02/14/13 1819  TROPONINI <0.30 <0.30   BNP:  Recent Labs Lab 02/14/13 0720  PROBNP 2711.0*   Coagulation:  Recent Labs Lab 02/14/13 0739  LABPROT 13.6  INR 1.05   Urinalysis:  Recent Labs Lab 02/14/13 0956  COLORURINE YELLOW   LABSPEC 1.017  PHURINE 5.0  GLUCOSEU NEGATIVE  HGBUR NEGATIVE  BILIRUBINUR NEGATIVE  KETONESUR NEGATIVE  PROTEINUR NEGATIVE  UROBILINOGEN 0.2  NITRITE NEGATIVE  LEUKOCYTESUR SMALL*    Micro Results: Recent Results (from the past 240 hour(s))  CULTURE, BLOOD (ROUTINE X 2)     Status: None   Collection Time    02/14/13  3:10 PM      Result Value Range Status   Specimen Description BLOOD LEFT HAND   Final   Special Requests BOTTLES DRAWN AEROBIC ONLY 3CC   Final   Culture  Setup Time 02/14/2013 22:52   Final   Culture     Final   Value:        BLOOD CULTURE RECEIVED NO GROWTH TO DATE CULTURE WILL BE HELD FOR 5 DAYS BEFORE ISSUING A FINAL NEGATIVE REPORT   Report Status PENDING   Incomplete  CULTURE, BLOOD (ROUTINE X 2)     Status: None   Collection Time    02/14/13  3:20 PM      Result Value Range Status   Specimen Description BLOOD LEFT ARM   Final   Special Requests BOTTLES DRAWN AEROBIC ONLY 3CC   Final   Culture  Setup Time 02/14/2013 22:52   Final   Culture     Final   Value:        BLOOD CULTURE RECEIVED NO GROWTH TO DATE CULTURE WILL BE HELD FOR 5 DAYS BEFORE ISSUING A FINAL NEGATIVE REPORT   Report Status PENDING   Incomplete   Studies/Results: Dg Chest 2 View  02/14/2013  *RADIOLOGY REPORT*  Clinical Data: Chest pain.  Generalized weakness.  Back pain. Indwelling pacing defibrillator.  CHEST - 2 VIEW  Comparison: One-view chest x-ray earlier same date 0753 hours.  Two- view chest x-ray 12/11/2004.  Findings: Left subclavian pacing defibrillator, with the venous lead having withdrawn minimally since 2006, still appropriately positioned.  Prior sternotomy for CABG.  Cardiac silhouette enlarged but stable.  Pulmonary vascularity normal without evidence of pulmonary edema.  Linear atelectasis in the right lower lobe. Patchy airspace opacities in the left lower lobe.  Lungs otherwise clear.  No pleural effusions.  Visualized bony thorax intact.  IMPRESSION: Stable cardiomegaly  without pulmonary edema.  Patchy pneumonia suspected in the left lower lobe.  Linear atelectasis in the right lower lobe.   Original Report Authenticated By: Hulan Saas, M.D.    Ct Abdomen Pelvis W Contrast  02/14/2013  *RADIOLOGY REPORT*  Clinical Data: Chest pain.  Low back pain.  Evaluate for AAA.  CT ABDOMEN AND PELVIS WITH CONTRAST  Technique:  Multidetector CT imaging of the abdomen and pelvis was performed following the standard protocol during bolus administration of intravenous contrast.  Contrast: OMNIPAQUE IOHEXOL 300 MG/ML  SOLN  Comparison: Acute abdominal series 02/14/2013  Findings: Cardiomegaly.  Bilateral lower lobe atelectasis or infiltrates.  There is a small to moderate sized hiatal hernia.  No pleural effusions.  Liver, spleen, pancreas, gallbladder, adrenals and kidneys are normal.  Descending colonic and sigmoid diverticulosis.  No active diverticulitis.  Small bowel and stomach are decompressed.  Aorta is normal caliber.  Distal calcifications.  No aneurysm or dissection.  Small left inguinal hernia containing fat.  Urinary bladder and prostate unremarkable.  No free fluid, free air or adenopathy.  No acute bony abnormality.  IMPRESSION: No evidence of aortic aneurysm or dissection.  Sigmoid and descending colonic diverticulosis.  Cardiomegaly.  Bilateral lower lobe atelectasis or consolidation.  Moderate hiatal hernia.   Original Report Authenticated By: Charlett Nose, M.D.    Dg Abd Acute W/chest  02/14/2013  *RADIOLOGY REPORT*  Clinical Data: Chest and abdominal pain.  ACUTE ABDOMEN SERIES (ABDOMEN 2 VIEW & CHEST 1 VIEW)  Comparison: 12/11/2004 chest x-ray.  Findings: Left AICD remains in place, age.  Cardiomegaly with low lung volumes, vascular congestion and bibasilar atelectasis.  Nonobstructive bowel gas pattern.  No free air.  No organomegaly or suspicious calcification.  No acute bony abnormality.  IMPRESSION: A nonobstructive bowel gas pattern.  No free air.   Cardiomegaly with low lung volumes, vascular congestion and bibasilar atelectasis.   Original Report Authenticated By: Charlett Nose, M.D.    Medications: I have reviewed the patient's current medications. Scheduled Meds: . allopurinol  100 mg Oral Daily  . aspirin  81 mg Oral Daily  . furosemide  20 mg Oral Daily  . heparin  5,000 Units Subcutaneous Q8H  . hydrALAZINE  12.5 mg Oral BID  . isosorbide mononitrate  30 mg Oral Daily  . levofloxacin  500 mg Oral Daily  . levothyroxine  125 mcg Oral QAC breakfast  . metoprolol succinate  50 mg Oral Daily  . pantoprazole  40 mg Oral Q1200  .  simvastatin  40 mg Oral QHS   Continuous Infusions:  PRN Meds:.acetaminophen, guaiFENesin-dextromethorphan Assessment/Plan: 1. Chest pain 2/2 CAP: Pt has extensive cardiac hx with CABG and defibrillator. ACS ruled out with no acute ischemic changes, troponins negative. Aortic anuerysm and AAA also r/o with CTAbd completed in ED. CXR concerning for new bilateral LL infiltrates and CT abdomen showed bilateral LLL. Pt CURB-65 score 0. Originally met SIRS criteria. Leukocytosis down trending, afebrile, and no tachycardia or tachypnea today. S. pneumo, legionella, and influenza all negative.  -d/c IV azithromycin -d/c IV Vanc -transition to oral levofloxacin -ambulate pt with O2 monitor sats   2. 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 No concerning neurological findings or red flag symptoms to further warrant imaging.  -cont toradol and tylenol prn pain -may need PT eval since lives alone with mother who can't safely pick up patient if were to fall at home. Pt has baseline gait abnormalities.  3. Chronic systolic heart failure: last EF 25% with akinesis of infereior/inferospetal/anterior/apical walls. No signs of acute exacerbation at this time.  -cont ASA, lasix 20mg  qd, hydralazine 12.5mg  BID, IMDUR  30mg  qd, toprol XL 50mg  qd  Dispo: Disposition is deferred at this time, awaiting improvement of current medical problems.  Anticipated discharge in approximately 1-2 day(s).   The patient does have a current PCP (CONROY,NATHAN, PA-C), therefore will be requiring [facility] follow-up after discharge.   The patient does not have transportation limitations that hinder transportation to clinic appointments.  .Services Needed at time of discharge: Y = Yes, Blank = No PT:   OT:   RN:   Equipment:   Other:     LOS: 1 day   Christen Bame, MD PGY-1 Pgr: [phone] 02/15/2013, 10:15 AM

## 2013-02-15 NOTE — H&P (Signed)
Internal Medicine Attending Admission Note Date: 02/15/2013  Patient name: Adam Gillespie Medical record number: [id number] Date of birth: July 05, 1956 Age: 57 y.o. Gender: male  I saw and evaluated the patient. I reviewed the resident's note and I agree with the resident's findings and plan as documented in the resident's note.  Chief Complaint(s): Chest pain and shortness of breath  History - key components related to admission: Patient is a 57 year old man with past medical history most significant for coronary artery disease status post MI and CABG, carotid artery stenosis, ischemic cardiomyopathy with ejection fraction 26% per July 2013, status post ICD and hypothyroidism presented with chief complaints of chest pain which started on the morning of admission. Patient describes left-sided chest pain which is associated with cough and shortness of breath which has been worsening over last one week, pain is mild to moderate, worse with walking, no radiation, not associated with sweating or palpitations, no relieving factors. In addition to the chest pain patient also complains of severe back pain for last 2 days after using mowing machine. Pain is unrelated to cough, deep breathing or movement.   Patient does not take deep breaths and always shallow breathing as per the daughter and mom. Patient was prescribed intensive spirometer to deal with this which he is using very sporadically.  Patient denies fever, chills, nausea, vomiting, diarrhea, hemoptysis, melena, urinary or bowel incontinence, orthopnea, PND, lower extremity edema.  15 point review of system is negative except what is noted above.   Past medical history, past surgical history, medications, family history and social history was reviewed and as per resident's note.   Physical Exam - key components related to admission:  Filed Vitals:   02/14/13 1315 02/14/13 1500 02/14/13 2106 02/15/13 0442  BP: 111/52 113/70 115/64 117/59   Pulse: 81 98 69 71  Temp:  98.5 F (36.9 C) 98.2 F (36.8 C) 98.8 F (37.1 C)  TempSrc:  Oral Oral Oral  Resp:  22 18 19   Height:  5\' 4"  (1.626 m)    Weight:  140 lb 3.4 oz (63.6 kg)  140 lb 14 oz (63.9 kg)  SpO2: 98% 98% 100% 99%   Physical Exam: General: Vital signs reviewed and noted. Well-developed, well-nourished, in no acute distress; alert, appropriate and cooperative throughout examination.  Head: Normocephalic, atraumatic.  Eyes: PERRL, EOMI, No signs of anemia or jaundince.  Nose: Mucous membranes moist, not inflammed, nonerythematous.  Throat: Oropharynx nonerythematous, no exudate appreciated.   Neck: No deformities, masses, or tenderness noted.Supple, No carotid Bruits, no JVD.  Lungs:  Normal respiratory effort. Decreased BS bilateral bases but without crackles or wheezes.  Heart: RRR. S1 and S2 normal without gallop, murmur, or rubs.  Abdomen:  BS normoactive. Soft, Nondistended, non-tender.  No masses or organomegaly.  Extremities: No pretibial edema.  Neurologic: A&O X3, CN II - XII are grossly intact. Motor strength is 5/5 in the all 4 extremities, Sensations intact to light touch, Cerebellar signs negative.  Skin: No visible rashes, scars.   Lab results:   Basic Metabolic Panel:  Recent Labs  40/98/11 0739 02/15/13 0600  NA 136 137  K 4.7 4.5  CL 100 103  CO2 22 26  GLUCOSE 96 86  BUN 23 26*  CREATININE 1.23 1.38*  CALCIUM 9.4 8.9   Liver Function Tests:  Recent Labs  02/14/13 0739  AST 17  ALT 8  ALKPHOS 73  BILITOT 0.2*  PROT 7.6  ALBUMIN 2.9*   CBC:  Recent Labs  02/14/13 0719 02/15/13 0600  WBC 15.4* 11.1*  HGB 15.7 13.4  HCT 44.2 38.6*  MCV 93.6 94.4  PLT 371 348   Cardiac Enzymes:  Recent Labs  02/14/13 1532 02/14/13 1819  TROPONINI <0.30 <0.30    Coagulation:  Recent Labs  02/14/13 0739  INR 1.05   Imaging results:  Dg Chest 2 View  02/14/2013  *RADIOLOGY REPORT*  Clinical Data: Chest pain.  Generalized  weakness.  Back pain. Indwelling pacing defibrillator.  CHEST - 2 VIEW  Comparison: One-view chest x-ray earlier same date 0753 hours.  Two- view chest x-ray 12/11/2004.  Findings: Left subclavian pacing defibrillator, with the venous lead having withdrawn minimally since 2006, still appropriately positioned.  Prior sternotomy for CABG.  Cardiac silhouette enlarged but stable.  Pulmonary vascularity normal without evidence of pulmonary edema.  Linear atelectasis in the right lower lobe. Patchy airspace opacities in the left lower lobe.  Lungs otherwise clear.  No pleural effusions.  Visualized bony thorax intact.  IMPRESSION: Stable cardiomegaly without pulmonary edema.  Patchy pneumonia suspected in the left lower lobe.  Linear atelectasis in the right lower lobe.   Original Report Authenticated By: Hulan Saas, M.D.    Ct Abdomen Pelvis W Contrast  02/14/2013  *RADIOLOGY REPORT*  Clinical Data: Chest pain.  Low back pain.  Evaluate for AAA.  CT ABDOMEN AND PELVIS WITH CONTRAST  Technique:  Multidetector CT imaging of the abdomen and pelvis was performed following the standard protocol during bolus administration of intravenous contrast.  Contrast: OMNIPAQUE IOHEXOL 300 MG/ML  SOLN  Comparison: Acute abdominal series 02/14/2013  Findings: Cardiomegaly.  Bilateral lower lobe atelectasis or infiltrates.  There is a small to moderate sized hiatal hernia.  No pleural effusions.  Liver, spleen, pancreas, gallbladder, adrenals and kidneys are normal.  Descending colonic and sigmoid diverticulosis.  No active diverticulitis.  Small bowel and stomach are decompressed.  Aorta is normal caliber.  Distal calcifications.  No aneurysm or dissection.  Small left inguinal hernia containing fat.  Urinary bladder and prostate unremarkable.  No free fluid, free air or adenopathy.  No acute bony abnormality.  IMPRESSION: No evidence of aortic aneurysm or dissection.  Sigmoid and descending colonic diverticulosis.   Cardiomegaly.  Bilateral lower lobe atelectasis or consolidation.  Moderate hiatal hernia.   Original Report Authenticated By: Charlett Nose, M.D.    Dg Abd Acute W/chest  02/14/2013  *RADIOLOGY REPORT*  Clinical Data: Chest and abdominal pain.  ACUTE ABDOMEN SERIES (ABDOMEN 2 VIEW & CHEST 1 VIEW)  Comparison: 12/11/2004 chest x-ray.  Findings: Left AICD remains in place, age.  Cardiomegaly with low lung volumes, vascular congestion and bibasilar atelectasis.  Nonobstructive bowel gas pattern.  No free air.  No organomegaly or suspicious calcification.  No acute bony abnormality.  IMPRESSION: A nonobstructive bowel gas pattern.  No free air.  Cardiomegaly with low lung volumes, vascular congestion and bibasilar atelectasis.   Original Report Authenticated By: Charlett Nose, M.D.     Other results: EKG: Poor quality EKG with a lot of baseline aberrations, sinus tachycardia noted , heart rate of 105 per minute, right axis deviation, intraventricular conduction delay most likely consistent with complete left bundle branch block, nonspecific ST and T wave changes noted which are new as compared to previous EKG done on June 2013.  Assessment & Plan by Problem:  Principal Problem:   Chest pain Active Problems:   Ischemic cardiomyopathy   Hyperlipemia   Hypothyroidism   Chronic systolic congestive heart failure  Automatic implantable cardiac defibrillator -[employer]   CAD (coronary artery disease)   CAP (community acquired pneumonia)  Patient is a 57 year old with past medical history of extensive coronary artery disease, chronic congestive heart failure systolic secondary to ischemic cardiomyopathy, carotid stenosis and multiple other medical problems who presented with chest pain and back pain and found to have community-acquired pneumonia on imaging studies done by the ER. Patient was also found to have elevated white blood cell count which supports the diagnosis.  -Patient was admitted to  telemetry -Acute coronary syndrome has been ruled out given negative cardiac enzymes and no EKG changes on followup EKG. The chest pain is thought to be most likely secondary to community-acquired pneumonia. Last echocardiogram within one year. Patient will followup with cardiology as scheduled.  -Patient currently on treatment for community-acquired pneumonia -Patient is saturating 99% on room air and we will walk him around with pulse oximeter to see if he drops his saturations. If patient is able to tolerate the physical therapy session, I believe he can be discharged home on oral antibiotics with followup in the clinic. -Patient still complaining of back pain which is most likely musculoskeletal in origin. Pain control and followup in outpatient. - advised to use IS at home and follow up with [facility](PCP) for follow up.  Rest of the medical management as per resident notes.  - OK to d/c home after above.    Lars Mage MD Faculty-Internal Medicine Residency Program

## 2013-02-15 NOTE — Progress Notes (Signed)
Patient having chest/epigastric, very difficult to determine, patient grimacing. Notified Dr. Burtis Junes. New orders given for [place] as needed every 8 hours. Will administer to patient and continue to monitor as needed.

## 2013-02-15 NOTE — Progress Notes (Signed)
Patient's family notified nurse that after eating patient begin to have upper gastric pain and patient is requesting tylenol. Tylenol given per PRN order and patient states pain 2 on scale of 1-10 but physical appearance rates at a 5-6 on 1-10 pain scale. Also patient got up to side of bed and began to show some dyspnea. Applied 1 liter of oxygen and dyspnea resolved. Family also states that patient will at times "huff and puff" and explains that this is his norm. Family states that he will do that and if one ask if he's out of breath he states no. But patient is currently calm but looks very exhausted from walking in hall, then up to chair, side of bed, then back to bed. This may have been a little much for patient and currently he just wants to rest. Notified Dr. Burtis Junes and will not discharge patient at this time. Will continue to monitor patient as needed to end of shift.

## 2013-02-16 MED ORDER — AZITHROMYCIN 500 MG PO TABS: 500.0000 mg | ORAL_TABLET | Freq: Every day | ORAL | Status: DC

## 2013-02-16 MED ORDER — INFLUENZA VIRUS VACC SPLIT PF IM SUSP: 0.2500 mL | INTRAMUSCULAR | Status: DC

## 2013-02-16 MED ORDER — PNEUMOCOCCAL VAC POLYVALENT 25 MCG/0.5ML IJ INJ
0.5000 mL | INJECTION | Freq: Once | INTRAMUSCULAR | Status: AC
  Administered 2013-02-16: 0.5 mL via INTRAMUSCULAR
  Filled 2013-02-16: qty 0.5

## 2013-02-16 NOTE — Progress Notes (Signed)
Subjective: Pt was set to d/c but then became increasingly tired after ambulation along with back pains. Pt feeling better this AM.   Objective: Vital signs in last 24 hours: Filed Vitals:   02/15/13 1400 02/15/13 1730 02/15/13 2100 02/16/13 0455  BP: 120/62 127/72 117/55 123/72  Pulse: 77 79 66 66  Temp: 98 F (36.7 C) 98 F (36.7 C) 98.2 F (36.8 C) 98.1 F (36.7 C)  TempSrc: Oral Oral Oral Oral  Resp: 18 18 18 19   Height:      Weight:    140 lb 10.5 oz (63.8 kg)  SpO2: 98% 97% 98% 99%   Weight change: 7.1 oz (0.2 kg)  Intake/Output Summary (Last 24 hours) at 02/16/13 0730 Last data filed at 02/16/13 0459  Gross per 24 hour  Intake    720 ml  Output   1175 ml  Net   -455 ml   General: resting in bed, NAD HEENT: PERRL, EOMI, no scleral icterus Cardiac: RRR, no rubs, murmurs or gallops Pulm: slight decreased BS bilaterally limited by pain, slight crackles in LLL, no wheezes or rhonchi Abd: soft, nontender, nondistended, BS present Ext: warm and well perfused, no pedal edema Neuro: alert and oriented X3, cranial nerves II-XII grossly intact  Lab Results: Basic Metabolic Panel:  Recent Labs Lab 02/14/13 0739 02/15/13 0600  NA 136 137  K 4.7 4.5  CL 100 103  CO2 22 26  GLUCOSE 96 86  BUN 23 26*  CREATININE 1.23 1.38*  CALCIUM 9.4 8.9   Liver Function Tests:  Recent Labs Lab 02/14/13 0739  AST 17  ALT 8  ALKPHOS 73  BILITOT 0.2*  PROT 7.6  ALBUMIN 2.9*   CBC:  Recent Labs Lab 02/14/13 0719 02/15/13 0600  WBC 15.4* 11.1*  HGB 15.7 13.4  HCT 44.2 38.6*  MCV 93.6 94.4  PLT 371 348   Cardiac Enzymes:  Recent Labs Lab 02/14/13 1532 02/14/13 1819  TROPONINI <0.30 <0.30   BNP:  Recent Labs Lab 02/14/13 0720  PROBNP 2711.0*   Coagulation:  Recent Labs Lab 02/14/13 0739  LABPROT 13.6  INR 1.05   Urinalysis:  Recent Labs Lab 02/14/13 0956  COLORURINE YELLOW  LABSPEC 1.017  PHURINE 5.0  GLUCOSEU NEGATIVE  HGBUR NEGATIVE   BILIRUBINUR NEGATIVE  KETONESUR NEGATIVE  PROTEINUR NEGATIVE  UROBILINOGEN 0.2  NITRITE NEGATIVE  LEUKOCYTESUR SMALL*    Micro Results: Recent Results (from the past 240 hour(s))  CULTURE, BLOOD (ROUTINE X 2)     Status: None   Collection Time    02/14/13  3:10 PM      Result Value Range Status   Specimen Description BLOOD LEFT HAND   Final   Special Requests BOTTLES DRAWN AEROBIC ONLY 3CC   Final   Culture  Setup Time 02/14/2013 22:52   Final   Culture     Final   Value:        BLOOD CULTURE RECEIVED NO GROWTH TO DATE CULTURE WILL BE HELD FOR 5 DAYS BEFORE ISSUING A FINAL NEGATIVE REPORT   Report Status PENDING   Incomplete  CULTURE, BLOOD (ROUTINE X 2)     Status: None   Collection Time    02/14/13  3:20 PM      Result Value Range Status   Specimen Description BLOOD LEFT ARM   Final   Special Requests BOTTLES DRAWN AEROBIC ONLY 3CC   Final   Culture  Setup Time 02/14/2013 22:52   Final   Culture  Final   Value:        BLOOD CULTURE RECEIVED NO GROWTH TO DATE CULTURE WILL BE HELD FOR 5 DAYS BEFORE ISSUING A FINAL NEGATIVE REPORT   Report Status PENDING   Incomplete   Studies/Results: Dg Chest 2 View  02/14/2013  *RADIOLOGY REPORT*  Clinical Data: Chest pain.  Generalized weakness.  Back pain. Indwelling pacing defibrillator.  CHEST - 2 VIEW  Comparison: One-view chest x-ray earlier same date 0753 hours.  Two- view chest x-ray 12/11/2004.  Findings: Left subclavian pacing defibrillator, with the venous lead having withdrawn minimally since 2006, still appropriately positioned.  Prior sternotomy for CABG.  Cardiac silhouette enlarged but stable.  Pulmonary vascularity normal without evidence of pulmonary edema.  Linear atelectasis in the right lower lobe. Patchy airspace opacities in the left lower lobe.  Lungs otherwise clear.  No pleural effusions.  Visualized bony thorax intact.  IMPRESSION: Stable cardiomegaly without pulmonary edema.  Patchy pneumonia suspected in the left  lower lobe.  Linear atelectasis in the right lower lobe.   Original Report Authenticated By: Hulan Saas, M.D.    Ct Abdomen Pelvis W Contrast  02/14/2013  *RADIOLOGY REPORT*  Clinical Data: Chest pain.  Low back pain.  Evaluate for AAA.  CT ABDOMEN AND PELVIS WITH CONTRAST  Technique:  Multidetector CT imaging of the abdomen and pelvis was performed following the standard protocol during bolus administration of intravenous contrast.  Contrast: OMNIPAQUE IOHEXOL 300 MG/ML  SOLN  Comparison: Acute abdominal series 02/14/2013  Findings: Cardiomegaly.  Bilateral lower lobe atelectasis or infiltrates.  There is a small to moderate sized hiatal hernia.  No pleural effusions.  Liver, spleen, pancreas, gallbladder, adrenals and kidneys are normal.  Descending colonic and sigmoid diverticulosis.  No active diverticulitis.  Small bowel and stomach are decompressed.  Aorta is normal caliber.  Distal calcifications.  No aneurysm or dissection.  Small left inguinal hernia containing fat.  Urinary bladder and prostate unremarkable.  No free fluid, free air or adenopathy.  No acute bony abnormality.  IMPRESSION: No evidence of aortic aneurysm or dissection.  Sigmoid and descending colonic diverticulosis.  Cardiomegaly.  Bilateral lower lobe atelectasis or consolidation.  Moderate hiatal hernia.   Original Report Authenticated By: Charlett Nose, M.D.    Dg Abd Acute W/chest  02/14/2013  *RADIOLOGY REPORT*  Clinical Data: Chest and abdominal pain.  ACUTE ABDOMEN SERIES (ABDOMEN 2 VIEW & CHEST 1 VIEW)  Comparison: 12/11/2004 chest x-ray.  Findings: Left AICD remains in place, age.  Cardiomegaly with low lung volumes, vascular congestion and bibasilar atelectasis.  Nonobstructive bowel gas pattern.  No free air.  No organomegaly or suspicious calcification.  No acute bony abnormality.  IMPRESSION: A nonobstructive bowel gas pattern.  No free air.  Cardiomegaly with low lung volumes, vascular congestion and bibasilar  atelectasis.   Original Report Authenticated By: Charlett Nose, M.D.    Medications: I have reviewed the patient's current medications. Scheduled Meds: . allopurinol  100 mg Oral Daily  . aspirin  81 mg Oral Daily  . azithromycin  500 mg Oral Daily  . furosemide  20 mg Oral Daily  . heparin  5,000 Units Subcutaneous Q8H  . hydrALAZINE  12.5 mg Oral BID  . isosorbide mononitrate  30 mg Oral Daily  . levothyroxine  125 mcg Oral QAC breakfast  . metoprolol succinate  50 mg Oral Daily  . pantoprazole  40 mg Oral Q1200  . simvastatin  40 mg Oral QHS   Continuous Infusions:  PRN  Meds:.acetaminophen, guaiFENesin-dextromethorphan, ketorolac Assessment/Plan: 1. Chest pain 2/2 CAP: Pt has extensive cardiac hx with CABG and defibrillator. ACS ruled out with no acute ischemic changes, troponins negative. Aortic anuerysm and AAA also r/o with CTAbd completed in ED. CXR concerning for new bilateral LL infiltrates and CT abdomen showed bilateral LLL. Pt CURB-65 score 0. Originally met SIRS criteria. Leukocytosis down trending, afebrile, and no tachycardia or tachypnea today. S. pneumo, legionella, and influenza all negative.  -cont oral azithromycin -ambulate pt with O2 monitor sats  -PT home health evaluation  2. 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 No concerning neurological findings or red flag symptoms to further warrant imaging.  -cont toradol and tylenol prn pain -may need PT eval since lives alone with mother who can't safely pick up patient if were to fall at home. Pt has baseline gait abnormalities.  3. Chronic systolic heart failure: last EF 25% with akinesis of infereior/inferospetal/anterior/apical walls. No signs of acute exacerbation at this time.  -cont ASA, lasix 20mg  qd, hydralazine 12.5mg  BID, IMDUR 30mg  qd, toprol XL 50mg  qd  Dispo: Disposition is deferred at this time,  awaiting improvement of current medical problems.  Anticipated discharge in approximately 1-2 day(s).   The patient does have a current PCP (CONROY,NATHAN, PA-C), therefore will be requiring OPC follow-up after discharge.   The patient does not have transportation limitations that hinder transportation to clinic appointments.  .Services Needed at time of discharge: Y = Yes, Blank = No PT: y  OT:   RN: y  Equipment:   Other:     LOS: 2 days   Christen Bame, MD PGY-1 Pgr: [phone] 02/16/2013, 7:30 AM

## 2013-02-16 NOTE — Evaluation (Signed)
Physical Therapy Evaluation Patient Details Name: Adam Gillespie MRN: [id number] DOB: Jun 23, 1956 Today's Date: 02/16/2013 Time: 4098-1191 PT Time Calculation (min): 20 min  PT Assessment / Plan / Recommendation Clinical Impression  Pt is 57 y/o male admitted for chest pain and dx with PNA.  Pt currently at baseline and safe to d/c home with 24 hour assist.    PT Assessment  Patent does not need any further PT services    Follow Up Recommendations  No PT follow up;Supervision/Assistance - 24 hour    Equipment Recommendations  None recommended by PT    Precautions / Restrictions Precautions Precautions: Fall   Pertinent Vitals/Pain No pain reported at this time      Mobility  Bed Mobility Bed Mobility: Supine to Sit Supine to Sit: 6: Modified independent (Device/Increase time);With rails Transfers Transfers: Sit to Stand;Stand to Sit Sit to Stand: From bed;5: Supervision Stand to Sit: To bed;5: Supervision Details for Transfer Assistance: Supervision for safey with slight unsteadiness with initial stand Ambulation/Gait Ambulation/Gait Assistance: 5: Supervision Ambulation Distance (Feet): 200 Feet Assistive device: None Ambulation/Gait Assistance Details: Pt with shuffle gait and forward trunk posture with ambulation.  However per family pt is at baseline with ambulation.   Gait Pattern: Decreased stride length;Shuffle Stairs: Yes Stairs Assistance: 4: Min guard Stair Management Technique: One rail Right;Forwards Number of Stairs: 3 Wheelchair Mobility Wheelchair Mobility: No     PT Diagnosis:    PT Problem List:   PT Treatment Interventions:     PT Goals    Visit Information  Last PT Received On: 02/16/13 Assistance Needed: +1    Subjective Data  Subjective: "I think I over did it." Patient Stated Goal: to go home today   Prior Functioning  Home Living Lives With:  (mother, brother) Available Help at Discharge: Family;Available 24 hours/day Type of  Home: Mobile home Home Access: Stairs to enter Entrance Stairs-Number of Steps: 3 Entrance Stairs-Rails: Left;Right Home Layout: One level Bathroom Shower/Tub: Forensic scientist: Standard Bathroom Accessibility: Yes How Accessible: Accessible via walker Home Adaptive Equipment: Straight cane;Walker - rolling;Bedside commode/3-in-1;Shower chair with back;Hand-held shower hose Prior Function Level of Independence: Independent Able to Take Stairs?: Yes Driving: No Vocation: Retired Musician: [employer] Dominant Hand: Left    Cognition  Cognition Arousal/Alertness: Awake/alert Behavior During Therapy: WFL for tasks assessed/performed Overall Cognitive Status: Within Functional Limits for tasks assessed    Extremity/Trunk Assessment Right Lower Extremity Assessment RLE ROM/Strength/Tone: Within functional levels RLE Sensation: WFL - Light Touch Left Lower Extremity Assessment LLE ROM/Strength/Tone: Within functional levels LLE Sensation: WFL - Light Touch LLE Coordination: WFL - gross/fine motor Trunk Assessment Trunk Assessment: Normal   Balance    End of Session PT - End of Session Equipment Utilized During Treatment: Gait belt Activity Tolerance: Patient tolerated treatment well Patient left: in chair;with call bell/phone within reach Nurse Communication: Mobility status  GP     Adam Gillespie 02/16/2013, 12:35 PM Jake Shark, PT DPT [phone]

## 2013-02-16 NOTE — Progress Notes (Signed)
Internal Medicine Teaching Service Attending Note Date: 02/16/2013  Patient name: Adam Gillespie  Medical record number: [id number]  Date of birth: Feb 16, 1956    This patient has been seen and discussed with the house staff. Please see their note for complete details. I concur with their findings with the following additions/corrections: Patient to be discharged home today and was advised to followup with his primary care physician. Please see discharge summary for further details.  Lars Mage 02/16/2013, 12:34 PM

## 2013-02-16 NOTE — Progress Notes (Signed)
Pharmacist Heart Failure Core Measure Documentation  Assessment: Adam Gillespie has an EF documented as 25% on 04/27/2012 by 2D echo.  Rationale: Heart failure patients with left ventricular systolic dysfunction (LVSD) and an EF < 40% should be prescribed an angiotensin converting enzyme inhibitor (ACEI) or angiotensin receptor blocker (ARB) at discharge unless a contraindication is documented in the medical record.  This patient is not currently on an ACEI or ARB for HF.  This note is being placed in the record in order to provide documentation that a contraindication to the use of these agents is present for this encounter.  ACE Inhibitor or Angiotensin Receptor Blocker is contraindicated (specify all that apply)  []   ACEI allergy AND ARB allergy []   Angioedema []   Moderate or severe aortic stenosis [x]   Hyperkalemia []   Hypotension []   Renal artery stenosis []   Worsening renal function, preexisting renal disease or dysfunction  Hx intolerant to ACEi, which have contributed to hyperkalemia and renal insufficiency in the past (see office note 10/03/2012).  On hydralazine and Imdur.  Dennie Fetters 02/16/2013 10:46 AM

## 2013-02-16 NOTE — Progress Notes (Signed)
Patient was d/c home via wife and daughter.  D/C instructions reviewed with daughter, wife, and patient.  Patient's iv d/c, heart monitor d/c.

## 2013-02-20 LAB — CULTURE, BLOOD (ROUTINE X 2): Culture: NO GROWTH

## 2013-02-25 LAB — REMOTE ICD DEVICE
BATTERY VOLTAGE: 2.71 V
BRDY-0002RV: 40 {beats}/min
CHARGE TIME: 10.77 s
TOT-0006: 20140131000000
TZAT-0001FASTVT: 1
TZAT-0004FASTVT: 8
TZAT-0005FASTVT: 88 pct
TZAT-0011SLOWVT: 10 ms
TZAT-0011SLOWVT: 10 ms
TZAT-0012FASTVT: 200 ms
TZAT-0012SLOWVT: 200 ms
TZAT-0012SLOWVT: 200 ms
TZAT-0013FASTVT: 1
TZAT-0013SLOWVT: 2
TZAT-0013SLOWVT: 2
TZAT-0018FASTVT: NEGATIVE
TZAT-0018SLOWVT: NEGATIVE
TZAT-0019SLOWVT: 8 V
TZAT-0019SLOWVT: 8 V
TZON-0003FASTVT: 240 ms
TZON-0003SLOWVT: 350 ms
TZON-0004SLOWVT: 16
TZON-0005SLOWVT: 40
TZON-0008SLOWVT: 0 ms
TZON-0011AFLUTTER: 70
TZST-0001FASTVT: 5
TZST-0001SLOWVT: 4
TZST-0003FASTVT: 35 J
TZST-0003FASTVT: 35 J
TZST-0003SLOWVT: 25 J
TZST-0003SLOWVT: 35 J
VENTRICULAR PACING ICD: 0 pct

## 2013-03-15 ENCOUNTER — Encounter: Payer: Self-pay | Admitting: *Deleted

## 2013-03-19 ENCOUNTER — Encounter: Payer: Self-pay | Admitting: Internal Medicine

## 2013-04-04 ENCOUNTER — Ambulatory Visit: Payer: Medicare Other | Admitting: Nurse Practitioner

## 2013-04-04 ENCOUNTER — Encounter (INDEPENDENT_AMBULATORY_CARE_PROVIDER_SITE_OTHER): Payer: Medicare Other

## 2013-04-04 ENCOUNTER — Encounter: Payer: Self-pay | Admitting: Physician Assistant

## 2013-04-04 ENCOUNTER — Ambulatory Visit (INDEPENDENT_AMBULATORY_CARE_PROVIDER_SITE_OTHER): Payer: Medicare Other | Admitting: Physician Assistant

## 2013-04-04 VITALS — BP 102/64 | HR 66 | Ht 64.0 in | Wt 138.0 lb

## 2013-04-04 DIAGNOSIS — E7800 Pure hypercholesterolemia, unspecified: Secondary | ICD-10-CM

## 2013-04-04 DIAGNOSIS — I6529 Occlusion and stenosis of unspecified carotid artery: Secondary | ICD-10-CM

## 2013-04-04 DIAGNOSIS — I2510 Atherosclerotic heart disease of native coronary artery without angina pectoris: Secondary | ICD-10-CM

## 2013-04-04 DIAGNOSIS — I6523 Occlusion and stenosis of bilateral carotid arteries: Secondary | ICD-10-CM

## 2013-04-04 DIAGNOSIS — I255 Ischemic cardiomyopathy: Secondary | ICD-10-CM

## 2013-04-04 DIAGNOSIS — I658 Occlusion and stenosis of other precerebral arteries: Secondary | ICD-10-CM

## 2013-04-04 DIAGNOSIS — I2589 Other forms of chronic ischemic heart disease: Secondary | ICD-10-CM

## 2013-04-04 DIAGNOSIS — I5022 Chronic systolic (congestive) heart failure: Secondary | ICD-10-CM

## 2013-04-04 LAB — BASIC METABOLIC PANEL
Chloride: 98 mEq/L (ref 96–112)
Potassium: 4.4 mEq/L (ref 3.5–5.1)

## 2013-04-04 NOTE — Progress Notes (Signed)
[address] [place], [place]  [address] Phone: [phone] Fax:  [phone]  Date:  04/04/2013   ID:  Adam Gillespie, DOB 23-Oct-1955, MRN [id number]  PCP:  [name]  Cardiologist:  Dr. Rollene Rotunda   Electrophysiologist:  Dr. Sherryl Manges    History of Present Illness: Adam Gillespie is a 57 y.o. male who returns for followup.  He has a history of CAD, status post CABG in 2001, ischemic cardiomyopathy with an EF of 25%, status post AICD, hypothyroidism, HL, GERD, CKD. 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 He is here with his mother and niece who both help with the history.  He has a tendency towards hyperkalemia and is not on an ACE inhibitor.  Records indicate nuclear study in 04/2010 demonstrated old anterior infarct, EF 26%. Echocardiogram 04/2012: EF 25%, inferior, inferoseptal, distal anterior and apical HK, mild AI, mild MR. Carotid Dopplers 09/2012: RICA 60-79%, LICA 40-59% (followup 6 months). Last seen in this office by [name], NP 09/2012.  Since last seen, he was admitted 4/30-5/1 with chest discomfort secondary to community acquired pneumonia.  Cardiac markers remained negative.  The patient denies chest pain, shortness of breath, syncope, orthopnea, PND or significant pedal edema.   Labs (5/14):  K 4.5, Cr 1.38, ALT 8, Hgb 13.4  Wt Readings from Last 3 Encounters:  04/04/13 138 lb (62.596 kg)  02/16/13 140 lb 10.5 oz (63.8 kg)  01/09/13 149 lb (67.586 kg)     Past Medical History  Diagnosis Date  . Anoxic encephalopathy     POSTOP FROM 2001  . LV dysfunction     EF is 25% per echo in July 2013  . Carotid stenosis, right     60-79% right, 40 - 59% left - needs dopplers in June 2014  . Hyperlipidemia   . Gout   . Hypothyroidism   . Ischemic cardiomyopathy     Remote MI with CABG x 5 in 2001; Myocardial perfusion imaging EF 26% old infarct anterior wall. 04/2010  . 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 Hearing deficit     Bilateral ears    Current Outpatient Prescriptions  Medication Sig Dispense Refill  . allopurinol (ZYLOPRIM) 100 MG tablet Take 1 tablet (100 mg total) by mouth daily.  90 tablet  3  . aspirin 81 MG tablet Take 81 mg by mouth daily.        . furosemide (LASIX) 20 MG tablet Take 20 mg by mouth daily.      . hydrALAZINE (APRESOLINE) 25 MG tablet 1/2 tablet twice a day  90 tablet  2  . isosorbide mononitrate (IMDUR) 30 MG 24 hr tablet Take 1 tablet (30 mg total) by mouth daily.  90 tablet  3  . levothyroxine (SYNTHROID, LEVOTHROID) 125 MCG tablet Take 1 tablet (125 mcg total) by mouth daily.  90 tablet  3  . meloxicam (MOBIC) 7.5 MG tablet Take 7.5 mg by mouth. As needed for gout flare up      . metoprolol succinate (TOPROL-XL) 50 MG 24 hr tablet Take 1 tablet (50 mg total) by mouth daily.  90 tablet  3  . omeprazole (PRILOSEC OTC) 20 MG tablet Take 1 tablet (20 mg total) by mouth daily.  90 tablet  3  . simvastatin (ZOCOR) 40 MG tablet Take 1 tablet (40 mg total) by mouth at bedtime.  90 tablet  3  . VESICARE 5 MG tablet Take 5  mg by mouth every other day.        No current facility-administered medications for this visit.    Allergies:    Allergies  Allergen Reactions  . Ace Inhibitors     Contributed to hyperkalemia and renal insufficiency in the past (see office note 10/03/2012)    Social History:  The patient  reports that he has never smoked. His smokeless tobacco use includes Chew. He reports that he does not drink alcohol or use illicit drugs.   ROS:  Please see the history of present illness.   He notes poor appetite since his pneumonia.  Has lost some weight.     All other systems reviewed and negative.   PHYSICAL EXAM: VS:  BP 102/64  Pulse 66  Ht 5\' 4"  (1.626 m)  Wt 138 lb (62.596 kg)  BMI 23.68 kg/m2  SpO2 98% Well nourished, well developed, in no acute distress HEENT: normal Neck: no JVD Cardiac:  normal S1, S2; RRR; no  murmur Lungs:  clear to auscultation bilaterally, no wheezing, rhonchi or rales Abd: soft, nontender, no hepatomegaly Ext: no edema Skin: warm and dry Neuro:  CNs 2-12 intact, no focal abnormalities noted  EKG:  NSR, HR 66, LBBB     ASSESSMENT AND PLAN:  1. CAD:  No angina.  Continue ASA and statin. 2. Chronic Systolic CHF:  Volume stable.  Family is concerned about him taking Lasix.  States it was started to "control his potassium."  Recent K+ ok in the hospital.  Check BMET today.  Review of his records indicates he was on Lasix previously.  It fell off his med list for unclear reasons in 2013.  If BUN elevated or creatinine worse, stop Lasix.  Otherwise, ok to continue. 3. Hyperlipidemia:  Continue statin.  4. S/p AICD:  F/u with EP as planned.  5. Carotid Stenosis:  F/u carotid U/S planned for today. 6. Disposition:  F/u with [name], NP in 6 mos.  Signed, [name], PA-C  04/04/2013 10:53 AM

## 2013-04-04 NOTE — Patient Instructions (Addendum)
Your physician wants you to follow-up in: 6 MONTHS WITH LORI GERHARDT. You will receive a reminder letter in the mail two months in advance. If you don't receive a letter, please call our office to schedule the follow-up appointment.   LAB TODAY BMET

## 2013-04-05 ENCOUNTER — Telehealth: Payer: Self-pay | Admitting: *Deleted

## 2013-04-05 DIAGNOSIS — I5022 Chronic systolic (congestive) heart failure: Secondary | ICD-10-CM

## 2013-04-05 MED ORDER — FUROSEMIDE 20 MG PO TABS: ORAL_TABLET | ORAL | Status: DC

## 2013-04-05 NOTE — Telephone Encounter (Signed)
s/w pt's mother who has been notified about lab results but is hard of hearing and asked for me to call grandaughter Elnita Maxwell. cheryl notified of results and lasix change, bmet 7/15 w/device check same day

## 2013-04-05 NOTE — Telephone Encounter (Signed)
Message copied by Tarri Fuller on Thu Apr 05, 2013  6:08 PM ------      Message from: [place], [place] T      Created: Wed Apr 04, 2013  9:51 PM       Na mildly low      BUN slightly high      K+ ok      Creatinine ok      I think based upon these labs, I would recommend he stop taking Lasix daily      Change to take only on Mondays and Thursdays (skip 04/05/13 dose)      Repeat BMET in 2 weeks      [name], PA-C        04/04/2013 9:51 PM ------

## 2013-05-01 ENCOUNTER — Encounter: Payer: Self-pay | Admitting: Internal Medicine

## 2013-05-01 ENCOUNTER — Ambulatory Visit (INDEPENDENT_AMBULATORY_CARE_PROVIDER_SITE_OTHER): Payer: Medicare Other | Admitting: Internal Medicine

## 2013-05-01 ENCOUNTER — Other Ambulatory Visit (INDEPENDENT_AMBULATORY_CARE_PROVIDER_SITE_OTHER): Payer: Medicare Other

## 2013-05-01 VITALS — BP 110/68 | HR 75 | Ht 64.0 in | Wt 140.1 lb

## 2013-05-01 DIAGNOSIS — I5022 Chronic systolic (congestive) heart failure: Secondary | ICD-10-CM

## 2013-05-01 DIAGNOSIS — J189 Pneumonia, unspecified organism: Secondary | ICD-10-CM

## 2013-05-01 DIAGNOSIS — I255 Ischemic cardiomyopathy: Secondary | ICD-10-CM

## 2013-05-01 DIAGNOSIS — Z95810 Presence of automatic (implantable) cardiac defibrillator: Secondary | ICD-10-CM

## 2013-05-01 DIAGNOSIS — I509 Heart failure, unspecified: Secondary | ICD-10-CM

## 2013-05-01 DIAGNOSIS — I2589 Other forms of chronic ischemic heart disease: Secondary | ICD-10-CM

## 2013-05-01 DIAGNOSIS — I2510 Atherosclerotic heart disease of native coronary artery without angina pectoris: Secondary | ICD-10-CM

## 2013-05-01 LAB — ICD DEVICE OBSERVATION
BATTERY VOLTAGE: 2.68 V
DEV-0020ICD: NEGATIVE
FVT: 0
PACEART VT: 0
TZAT-0004FASTVT: 8
TZAT-0004SLOWVT: 8
TZAT-0005FASTVT: 88 pct
TZAT-0005SLOWVT: 88 pct
TZAT-0005SLOWVT: 91 pct
TZAT-0011FASTVT: 10 ms
TZAT-0011SLOWVT: 10 ms
TZAT-0011SLOWVT: 10 ms
TZAT-0012FASTVT: 200 ms
TZAT-0012SLOWVT: 200 ms
TZAT-0012SLOWVT: 200 ms
TZAT-0013FASTVT: 1
TZAT-0020FASTVT: 1.6 ms
TZON-0003FASTVT: 240 ms
TZON-0003SLOWVT: 350 ms
TZON-0011AFLUTTER: 70
TZST-0001FASTVT: 4
TZST-0001FASTVT: 5
TZST-0001SLOWVT: 4
TZST-0001SLOWVT: 6
TZST-0003FASTVT: 35 J
TZST-0003FASTVT: 35 J
TZST-0003FASTVT: 35 J
TZST-0003FASTVT: 35 J
TZST-0003SLOWVT: 25 J
TZST-0003SLOWVT: 35 J
VENTRICULAR PACING ICD: 0 pct
VF: 0

## 2013-05-01 LAB — BASIC METABOLIC PANEL
Calcium: 9.5 mg/dL (ref 8.4–10.5)
GFR: 61.27 mL/min (ref >60.00)
Glucose, Bld: 89 mg/dL (ref 70–99)
Sodium: 137 mEq/L (ref 135–145)

## 2013-05-01 NOTE — Progress Notes (Signed)
kf Patient Care Team: Lonie Peak as PCP - General (Physician Assistant)   HPI  Adam Gillespie is a 57 y.o. male Seen in followup for an ICD implanted 2006 the setting of ischemic heart disease and prior bypass surgery x5 in 2001     Records indicate nuclear study in 04/2010 demonstrated old anterior infarct, EF 26%. Echocardiogram 04/2012: EF 25%, inferior, inferoseptal, distal anterior and apical HK, mild AI, mild MR.     He was recently hospitalized for community acquired pneumonia presenting as chest pain. He is having some recurrent cough. There has been no fever or sputum production.  Past Medical History  Diagnosis Date  . Anoxic encephalopathy     POSTOP FROM 2001  . LV dysfunction     EF is 25% per echo in July 2013  . Carotid stenosis, right     60-79% right, 40 - 59% left - needs dopplers in June 2014  . Hyperlipidemia   . Gout   . Hypothyroidism   . Ischemic cardiomyopathy     Remote MI with CABG x 5 in 2001; Myocardial perfusion imaging EF 26% old infarct anterior wall. 04/2010  . 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 Hearing deficit     Bilateral ears    Past Surgical History  Procedure Laterality Date  . Cardiac catheterization  08/05/2000    THERE IS ANTERIOR AND APICAL AKINESIS. EF IS ESTIMATED 20%, WITH INFEROBASILAR PORTIONS CONTRACTING REASONABLY WELL.  Marland Kitchen Coronary artery bypass graft      x5. lima to the lad, saphenous vein graft to the intermediate and obtuse mariginal, and a saphenous vein graft to the acute mariginal and distal right coronary artery  . Cardiac defibrillator placement    . Colonoscopy with propofol N/A 01/09/2013    Procedure: COLONOSCOPY WITH PROPOFOL;  Surgeon: Shirley Friar, MD;  Location: [facility];  Service: Endoscopy;  Laterality: N/A;    Current Outpatient Prescriptions  Medication Sig Dispense Refill  . allopurinol (ZYLOPRIM) 100 MG tablet Take 1 tablet (100 mg total) by mouth daily.  90 tablet  3  . aspirin  81 MG tablet Take 81 mg by mouth daily.        . furosemide (LASIX) 20 MG tablet TAKE 20 MG ONLY ON MON, AND THURSDAY'S      . hydrALAZINE (APRESOLINE) 25 MG tablet 1/2 tablet twice a day  90 tablet  2  . isosorbide mononitrate (IMDUR) 30 MG 24 hr tablet Take 1 tablet (30 mg total) by mouth daily.  90 tablet  3  . levothyroxine (SYNTHROID, LEVOTHROID) 125 MCG tablet Take 1 tablet (125 mcg total) by mouth daily.  90 tablet  3  . meloxicam (MOBIC) 7.5 MG tablet Take 7.5 mg by mouth. As needed for gout flare up      . metoprolol succinate (TOPROL-XL) 50 MG 24 hr tablet Take 1 tablet (50 mg total) by mouth daily.  90 tablet  3  . omeprazole (PRILOSEC OTC) 20 MG tablet Take 20 mg by mouth 2 (two) times daily.      . simvastatin (ZOCOR) 40 MG tablet Take 1 tablet (40 mg total) by mouth at bedtime.  90 tablet  3  . VESICARE 5 MG tablet Take 5 mg by mouth every other day.        No current facility-administered medications for this visit.    Allergies  Allergen Reactions  . Ace Inhibitors     Contributed to hyperkalemia and renal insufficiency in  the past (see office note 10/03/2012)    Review of Systems negative except from HPI and PMH  Physical Exam BP 110/68  Pulse 75  Ht 5\' 4"  (1.626 m)  Wt 140 lb 1.9 oz (63.558 kg)  BMI 24.04 kg/m2 Well developed and well nourished in no acute distress HENT normal E scleral and icterus clear Neck Supple JVP flat; carotids brisk and full Clear to ausculation anteriorly and posteriorly Device pocket well healed; without hematoma or erythema.  There is no tethering Regular rate and rhythm, no murmurs gallops or rub Soft with active bowel sounds No clubbing cyanosis none Edema Alert and oriented, grossly normal motor and sensory function Skin Warm and Dry  of  Assessment and  Plan

## 2013-05-01 NOTE — Assessment & Plan Note (Signed)
He is euvolemic. We'll continue him on his current medication

## 2013-05-01 NOTE — Assessment & Plan Note (Signed)
Stable. Continue statins and aspirin

## 2013-05-01 NOTE — Assessment & Plan Note (Signed)
The patient has a recurrent cough or no clinical evidence of recurrent pneumonia. His chest x-ray was abnormal at the time of his presentation and willing to stay reevaluation by his PCP

## 2013-05-01 NOTE — Assessment & Plan Note (Signed)
The patient's device was interrogated.  The information was reviewed. No changes were made in the programming.    

## 2013-05-01 NOTE — Patient Instructions (Addendum)

## 2013-05-02 ENCOUNTER — Telehealth: Payer: Self-pay | Admitting: *Deleted

## 2013-05-02 NOTE — Telephone Encounter (Signed)
Advised mother of labs.

## 2013-05-02 NOTE — Telephone Encounter (Signed)
Message copied by Burnell Blanks on Wed May 02, 2013  8:44 AM ------      Message from: [place], [place] T      Created: Tue May 01, 2013  5:45 PM       Potassium and kidney function ok      Continue with current treatment plan.      [name], PA-C        05/01/2013 5:45 PM ------

## 2013-05-04 ENCOUNTER — Encounter: Payer: Self-pay | Admitting: Internal Medicine

## 2013-07-11 ENCOUNTER — Other Ambulatory Visit: Payer: Self-pay | Admitting: Gastroenterology

## 2013-07-11 ENCOUNTER — Encounter (HOSPITAL_COMMUNITY)
Admission: RE | Disposition: A | Discharge status: Home / Self Care | Payer: Self-pay | Source: Ambulatory Visit | Attending: Gastroenterology

## 2013-07-11 ENCOUNTER — Ambulatory Visit (HOSPITAL_COMMUNITY): Payer: Medicare Other

## 2013-07-11 ENCOUNTER — Ambulatory Visit (HOSPITAL_COMMUNITY)
Admission: RE | Admit: 2013-07-11 | Discharge: 2013-07-11 | Disposition: A | Discharge status: Home / Self Care | Payer: Medicare Other | Source: Ambulatory Visit | Attending: Gastroenterology | Admitting: Gastroenterology

## 2013-07-11 ENCOUNTER — Encounter (HOSPITAL_COMMUNITY): Payer: Self-pay | Admitting: *Deleted

## 2013-07-11 DIAGNOSIS — K449 Diaphragmatic hernia without obstruction or gangrene: Secondary | ICD-10-CM | POA: Insufficient documentation

## 2013-07-11 DIAGNOSIS — K222 Esophageal obstruction: Secondary | ICD-10-CM | POA: Insufficient documentation

## 2013-07-11 DIAGNOSIS — R1310 Dysphagia, unspecified: Secondary | ICD-10-CM | POA: Diagnosis present

## 2013-07-11 HISTORY — PX: ESOPHAGOGASTRODUODENOSCOPY: SHX5428

## 2013-07-11 HISTORY — PX: SAVORY DILATION: SHX5439

## 2013-07-11 HISTORY — PX: BALLOON DILATION: SHX5330

## 2013-07-11 SURGERY — EGD (ESOPHAGOGASTRODUODENOSCOPY)
Anesthesia: Moderate Sedation

## 2013-07-11 MED ORDER — FENTANYL CITRATE 0.05 MG/ML IJ SOLN
INTRAMUSCULAR | Status: AC
  Filled 2013-07-11: qty 4

## 2013-07-11 MED ORDER — SODIUM CHLORIDE 0.9 % IV SOLN
INTRAVENOUS | Status: DC
  Administered 2013-07-11: 500 mL via INTRAVENOUS

## 2013-07-11 MED ORDER — BUTAMBEN-TETRACAINE-BENZOCAINE 2-2-14 % EX AERO
INHALATION_SPRAY | CUTANEOUS | Status: DC | PRN
  Administered 2013-07-11: 2 via TOPICAL

## 2013-07-11 MED ORDER — MIDAZOLAM HCL 5 MG/ML IJ SOLN
INTRAMUSCULAR | Status: AC
  Filled 2013-07-11: qty 2

## 2013-07-11 MED ORDER — DIPHENHYDRAMINE HCL 50 MG/ML IJ SOLN
INTRAMUSCULAR | Status: AC
  Filled 2013-07-11: qty 1

## 2013-07-11 MED ORDER — FENTANYL CITRATE 0.05 MG/ML IJ SOLN
INTRAMUSCULAR | Status: DC | PRN
  Administered 2013-07-11 (×2): 25 ug via INTRAVENOUS

## 2013-07-11 MED ORDER — MIDAZOLAM HCL 10 MG/2ML IJ SOLN
INTRAMUSCULAR | Status: DC | PRN
  Administered 2013-07-11: 2 mg via INTRAVENOUS
  Administered 2013-07-11: 1 mg via INTRAVENOUS
  Administered 2013-07-11: 2 mg via INTRAVENOUS

## 2013-07-11 NOTE — Op Note (Signed)
[facility] [address] [place], [address]   ENDOSCOPY PROCEDURE REPORT  PATIENT: Adam Gillespie, Adam Gillespie  MR#: [id number] BIRTHDATE: Oct 06, 1956 , 57  yrs. old GENDER: Male  ENDOSCOPIST: Charlott Rakes, MD REFERRED BY:  PROCEDURE DATE:  07/11/2013 PROCEDURE:   Savary dilation of esophagus ASA CLASS:   Class III INDICATIONS:Dysphagia. MEDICATIONS: Fentanyl 50 mcg IV, Versed 5 mg IV, and Cetacaine spray x 2  TOPICAL ANESTHETIC:  DESCRIPTION OF PROCEDURE:   After the risks benefits and alternatives of the procedure were thoroughly explained, informed consent was obtained.  The Pentax [id number] 3.8 [id number]  endoscope was introduced through the mouth and advanced to the second portion of the duodenum , limited by Without limitations.   The instrument was slowly withdrawn as the mucosa was fully examined.     FINDINGS: The endoscope was inserted into the oropharynx and esophagus was intubated. A distal esophageal stricture was seen that prevented passage of the 11.6 mm diameter endoscope. Fluoro was then used with passage of a spring-tipped guide wire into the stomach. The wire was kept in place while the endoscope was removed in a 1:1 fashion. Savary dilators were used in sequential fashion starting at 14 mm, 15 mm, and 16 mm in diameter. No blood was seen with dilation with the 14 mm-sized endoscope. A small amount of blood was seen with the 15mm sized dilator and a moderate amount of blood was seen with the 16 mm-sized endoscope. The endoscope was reinserted. The gastroesophageal junction was noted to be 38 cm from the incisors. Endoscope was advanced into the stomach, which revealed a normal body and antrum.  The endoscope was advanced to the duodenal bulb and second portion of duodenum which were unremarkable.  The endoscope was withdrawn back into the stomach and retroflexion revealed a small hiatal hernia.  COMPLICATIONS: None  ENDOSCOPIC  IMPRESSION:     Distal esophageal stricture - s/p Savary dilation with fluoro to 16 mm size; Hiatal hernia  RECOMMENDATIONS: PPI QD; Advance diet as tolerated; F/U in office in 1-2 months   REPEAT EXAM: N/A  _______________________________ Charlott Rakes, MD eSigned:  Charlott Rakes, MD 07/11/2013 10:03 AM    CC:  PATIENT NAME:  Blanton, Kardell MR#: [id number]

## 2013-07-11 NOTE — H&P (Signed)
  Date of Initial H&P: 07/06/13  History reviewed, patient examined, no change in status, stable for surgery. 

## 2013-07-11 NOTE — Interval H&P Note (Signed)
History and Physical Interval Note:  07/11/2013 9:08 AM  Adam Gillespie  has presented today for surgery, with the diagnosis of dysphagia/esop.ring  The various methods of treatment have been discussed with the patient and family. After consideration of risks, benefits and other options for treatment, the patient has consented to  Procedure(s): ESOPHAGOGASTRODUODENOSCOPY (EGD) (N/A) SAVORY DILATION (N/A) BALLOON DILATION (N/A) as a surgical intervention .  The patient's history has been reviewed, patient examined, no change in status, stable for surgery.  I have reviewed the patient's chart and labs.  Questions were answered to the patient's satisfaction.     Percell Lamboy C.

## 2013-07-12 ENCOUNTER — Encounter (HOSPITAL_COMMUNITY): Payer: Self-pay | Admitting: Gastroenterology

## 2013-08-06 ENCOUNTER — Ambulatory Visit (INDEPENDENT_AMBULATORY_CARE_PROVIDER_SITE_OTHER): Payer: Medicare Other | Admitting: *Deleted

## 2013-08-06 ENCOUNTER — Encounter: Payer: Self-pay | Admitting: Internal Medicine

## 2013-08-06 DIAGNOSIS — I2589 Other forms of chronic ischemic heart disease: Secondary | ICD-10-CM

## 2013-08-06 DIAGNOSIS — I255 Ischemic cardiomyopathy: Secondary | ICD-10-CM

## 2013-08-06 DIAGNOSIS — Z95810 Presence of automatic (implantable) cardiac defibrillator: Secondary | ICD-10-CM

## 2013-08-16 LAB — REMOTE ICD DEVICE
BATTERY VOLTAGE: 2.65 V
BRDY-0002RV: 40 {beats}/min
CHARGE TIME: 11.12 s
RV LEAD AMPLITUDE: 6.8 mv
RV LEAD IMPEDENCE ICD: 496 Ohm
TZAT-0001FASTVT: 1
TZAT-0001SLOWVT: 1
TZAT-0001SLOWVT: 2
TZAT-0004FASTVT: 8
TZAT-0004SLOWVT: 8
TZAT-0004SLOWVT: 8
TZAT-0005FASTVT: 88 pct
TZAT-0011SLOWVT: 10 ms
TZAT-0011SLOWVT: 10 ms
TZAT-0012FASTVT: 200 ms
TZAT-0012SLOWVT: 200 ms
TZAT-0012SLOWVT: 200 ms
TZAT-0018FASTVT: NEGATIVE
TZAT-0020FASTVT: 1.6 ms
TZAT-0020SLOWVT: 1.6 ms
TZAT-0020SLOWVT: 1.6 ms
TZON-0003SLOWVT: 350 ms
TZON-0011AFLUTTER: 70
TZST-0001FASTVT: 2
TZST-0001FASTVT: 3
TZST-0001FASTVT: 4
TZST-0001FASTVT: 5
TZST-0001SLOWVT: 3
TZST-0001SLOWVT: 5
TZST-0003FASTVT: 35 J
TZST-0003FASTVT: 35 J
TZST-0003FASTVT: 35 J
TZST-0003SLOWVT: 25 J
TZST-0003SLOWVT: 35 J

## 2013-08-24 ENCOUNTER — Encounter: Payer: Self-pay | Admitting: *Deleted

## 2013-09-10 ENCOUNTER — Other Ambulatory Visit: Payer: Self-pay | Admitting: Internal Medicine

## 2013-10-18 HISTORY — PX: ESOPHAGEAL DILATION: SHX303

## 2013-10-23 ENCOUNTER — Other Ambulatory Visit: Payer: Self-pay

## 2013-10-23 DIAGNOSIS — I5022 Chronic systolic (congestive) heart failure: Secondary | ICD-10-CM

## 2013-10-23 MED ORDER — HYDRALAZINE HCL 25 MG PO TABS: ORAL_TABLET | ORAL | Status: DC

## 2013-10-23 MED ORDER — LEVOTHYROXINE SODIUM 125 MCG PO TABS: 125.0000 ug | ORAL_TABLET | Freq: Every day | ORAL | Status: DC

## 2013-10-23 MED ORDER — FUROSEMIDE 20 MG PO TABS: ORAL_TABLET | ORAL | Status: DC

## 2013-10-23 MED ORDER — SIMVASTATIN 40 MG PO TABS: 40.0000 mg | ORAL_TABLET | Freq: Every day | ORAL | Status: DC

## 2013-10-23 MED ORDER — METOPROLOL SUCCINATE ER 50 MG PO TB24: 50.0000 mg | ORAL_TABLET | Freq: Every day | ORAL | Status: DC

## 2013-10-23 MED ORDER — ISOSORBIDE MONONITRATE ER 30 MG PO TB24: 30.0000 mg | ORAL_TABLET | Freq: Every day | ORAL | Status: DC

## 2013-10-23 MED ORDER — ALLOPURINOL 100 MG PO TABS: 100.0000 mg | ORAL_TABLET | Freq: Every day | ORAL | Status: DC

## 2013-10-29 ENCOUNTER — Encounter (HOSPITAL_COMMUNITY): Payer: Medicare Other

## 2013-11-05 ENCOUNTER — Encounter (HOSPITAL_COMMUNITY): Payer: Medicare Other

## 2013-11-05 DIAGNOSIS — R0989 Other specified symptoms and signs involving the circulatory and respiratory systems: Secondary | ICD-10-CM

## 2013-11-09 ENCOUNTER — Ambulatory Visit (INDEPENDENT_AMBULATORY_CARE_PROVIDER_SITE_OTHER): Payer: Medicare HMO | Admitting: *Deleted

## 2013-11-09 DIAGNOSIS — I255 Ischemic cardiomyopathy: Secondary | ICD-10-CM

## 2013-11-09 DIAGNOSIS — I428 Other cardiomyopathies: Secondary | ICD-10-CM

## 2013-11-09 DIAGNOSIS — I2589 Other forms of chronic ischemic heart disease: Secondary | ICD-10-CM

## 2013-11-12 LAB — MDC_IDC_ENUM_SESS_TYPE_REMOTE
Battery Voltage: 2.64 V
Date Time Interrogation Session: 20150123194000
HIGH POWER IMPEDANCE MEASURED VALUE: 51 Ohm
Lead Channel Impedance Value: 512 Ohm
Lead Channel Sensing Intrinsic Amplitude: 8.5 mV
Lead Channel Setting Pacing Amplitude: 2.5 V
Lead Channel Setting Pacing Pulse Width: 0.4 ms
Lead Channel Setting Sensing Sensitivity: 0.3 mV
MDC IDC SET ZONE DETECTION INTERVAL: 240 ms
MDC IDC STAT BRADY RV PERCENT PACED: 0 %
Zone Setting Detection Interval: 300 ms
Zone Setting Detection Interval: 350 ms

## 2013-11-21 ENCOUNTER — Encounter (HOSPITAL_COMMUNITY): Payer: Medicare HMO

## 2013-11-27 ENCOUNTER — Encounter: Payer: Self-pay | Admitting: *Deleted

## 2013-12-03 ENCOUNTER — Ambulatory Visit (INDEPENDENT_AMBULATORY_CARE_PROVIDER_SITE_OTHER): Payer: Medicare HMO | Admitting: Nurse Practitioner

## 2013-12-03 ENCOUNTER — Other Ambulatory Visit: Payer: Self-pay | Admitting: *Deleted

## 2013-12-03 ENCOUNTER — Ambulatory Visit (HOSPITAL_COMMUNITY): Payer: Medicare HMO | Attending: Cardiovascular Disease

## 2013-12-03 ENCOUNTER — Encounter: Payer: Self-pay | Admitting: Nurse Practitioner

## 2013-12-03 ENCOUNTER — Encounter: Payer: Self-pay | Admitting: Cardiovascular Disease

## 2013-12-03 VITALS — BP 108/70 | HR 71 | Ht 64.0 in | Wt 145.1 lb

## 2013-12-03 DIAGNOSIS — E785 Hyperlipidemia, unspecified: Secondary | ICD-10-CM | POA: Insufficient documentation

## 2013-12-03 DIAGNOSIS — I2510 Atherosclerotic heart disease of native coronary artery without angina pectoris: Secondary | ICD-10-CM | POA: Insufficient documentation

## 2013-12-03 DIAGNOSIS — I6529 Occlusion and stenosis of unspecified carotid artery: Secondary | ICD-10-CM | POA: Insufficient documentation

## 2013-12-03 DIAGNOSIS — Z951 Presence of aortocoronary bypass graft: Secondary | ICD-10-CM | POA: Insufficient documentation

## 2013-12-03 DIAGNOSIS — I658 Occlusion and stenosis of other precerebral arteries: Secondary | ICD-10-CM | POA: Insufficient documentation

## 2013-12-03 DIAGNOSIS — I5022 Chronic systolic (congestive) heart failure: Secondary | ICD-10-CM

## 2013-12-03 LAB — BASIC METABOLIC PANEL
BUN: 17 mg/dL (ref 6–23)
CO2: 28 mEq/L (ref 19–32)
Calcium: 9.1 mg/dL (ref 8.4–10.5)
Chloride: 105 mEq/L (ref 96–112)
Creatinine, Ser: 1.3 mg/dL (ref 0.4–1.5)
GFR: 62.82 mL/min (ref >60.00)
Glucose, Bld: 104 mg/dL — ABNORMAL HIGH (ref 70–99)
Potassium: 4.6 mEq/L (ref 3.5–5.1)
Sodium: 140 mEq/L (ref 135–145)

## 2013-12-03 LAB — HEPATIC FUNCTION PANEL
ALT: 22 U/L (ref 0–53)
AST: 18 U/L (ref 0–37)
Albumin: 3.8 g/dL (ref 3.5–5.2)
Alkaline Phosphatase: 69 U/L (ref 39–117)
Bilirubin, Direct: 0 mg/dL (ref 0.0–0.3)
Total Bilirubin: 0.4 mg/dL (ref 0.3–1.2)
Total Protein: 6.9 g/dL (ref 6.0–8.3)

## 2013-12-03 LAB — LIPID PANEL
Cholesterol: 142 mg/dL (ref 0–200)
HDL: 38.6 mg/dL — ABNORMAL LOW (ref >39.00)
Total CHOL/HDL Ratio: 4
Triglycerides: 228 mg/dL — ABNORMAL HIGH (ref 0.0–149.0)
VLDL: 45.6 mg/dL — ABNORMAL HIGH (ref 0.0–40.0)

## 2013-12-03 LAB — LDL CHOLESTEROL, DIRECT: Direct LDL: 83 mg/dL

## 2013-12-03 LAB — TSH: TSH: 1 u[IU]/mL (ref 0.35–5.50)

## 2013-12-03 MED ORDER — HYDRALAZINE HCL 25 MG PO TABS: ORAL_TABLET | ORAL | Status: DC

## 2013-12-03 MED ORDER — LEVOTHYROXINE SODIUM 125 MCG PO TABS: 125.0000 ug | ORAL_TABLET | Freq: Every day | ORAL | Status: DC

## 2013-12-03 MED ORDER — ALLOPURINOL 100 MG PO TABS
100.0000 mg | ORAL_TABLET | Freq: Every day | ORAL | Status: DC
Start: 2013-12-03 — End: 2015-03-18

## 2013-12-03 MED ORDER — ISOSORBIDE MONONITRATE ER 30 MG PO TB24: 30.0000 mg | ORAL_TABLET | Freq: Every day | ORAL | Status: DC

## 2013-12-03 MED ORDER — SIMVASTATIN 40 MG PO TABS: 40.0000 mg | ORAL_TABLET | Freq: Every day | ORAL | Status: DC

## 2013-12-03 MED ORDER — FUROSEMIDE 20 MG PO TABS: ORAL_TABLET | ORAL | Status: DC

## 2013-12-03 MED ORDER — METOPROLOL SUCCINATE ER 50 MG PO TB24: 50.0000 mg | ORAL_TABLET | Freq: Every day | ORAL | Status: DC

## 2013-12-03 NOTE — Addendum Note (Signed)
Addended by: Tonita Phoenix on: 12/03/2013 09:57 AM   Modules accepted: Orders

## 2013-12-03 NOTE — Patient Instructions (Signed)
We will check labs today  I will see you in 6 months  Stay on your current medicines - I have sent refills in today to [facility]  Please call if you have any problems, concerns, etc - [phone]

## 2013-12-03 NOTE — Progress Notes (Signed)
Adam Gillespie Date of Birth: 1956-02-10 Medical Record #[id number]  History of Present Illness: Adam Gillespie is seen back today for a follow up visit. Seen for [name] - former patient of Dr. [name] that I previously cared for. He has known CAD with past NSTEMI in 2001 and an ischemic CM, has ICD in place, had CABG x 5 back in 2001 - complicated by anoxic encephalopathy. Other issues include carotid disease, HLD, gout, and hypothyroidism. He has had a chronic tendency towards hyperkalemia and is NOT on ACE. EF of 25% per echo from 2013.  Last seen by [name] back in July of 2014. Device was doing ok.   Comes back today. Here with his mom and sister. Doing ok. No chest pain. Not short of breath. No swelling. No ICD discharges. Had his carotid doppler this morning - results pending. Medicines are going ok.    Current Outpatient Prescriptions  Medication Sig Dispense Refill  . allopurinol (ZYLOPRIM) 100 MG tablet Take 1 tablet (100 mg total) by mouth daily.  90 tablet  3  . aspirin 81 MG tablet Take 81 mg by mouth daily.        . furosemide (LASIX) 20 MG tablet TAKE 20 MG ONLY ON MON, AND THURSDAY'S  30 tablet  3  . hydrALAZINE (APRESOLINE) 25 MG tablet TAKE ONE-HALF (1/2) TABLET TWICE A DAY  90 tablet  2  . isosorbide mononitrate (IMDUR) 30 MG 24 hr tablet Take 1 tablet (30 mg total) by mouth daily.  90 tablet  3  . levothyroxine (SYNTHROID, LEVOTHROID) 125 MCG tablet Take 1 tablet (125 mcg total) by mouth daily.  90 tablet  3  . meloxicam (MOBIC) 7.5 MG tablet Take 7.5 mg by mouth. As needed for gout flare up      . metoprolol succinate (TOPROL-XL) 50 MG 24 hr tablet Take 1 tablet (50 mg total) by mouth daily.  90 tablet  3  . omeprazole (PRILOSEC OTC) 20 MG tablet Take 40 mg by mouth daily.       . simvastatin (ZOCOR) 40 MG tablet Take 1 tablet (40 mg total) by mouth at bedtime.  90 tablet  3  . VESICARE 5 MG tablet Take 5 mg by mouth every other day.        No current  facility-administered medications for this visit.    Allergies  Allergen Reactions  . Ace Inhibitors     Contributed to hyperkalemia and renal insufficiency in the past (see office note 10/03/2012)    Past Medical History  Diagnosis Date  . Anoxic encephalopathy     POSTOP FROM 2001  . LV dysfunction     EF is 25% per echo in July 2013  . Carotid stenosis, right     60-79% right, 40 - 59% left - needs dopplers in June 2014  . Hyperlipidemia   . Gout   . Hypothyroidism   . Ischemic cardiomyopathy     Remote MI with CABG x 5 in 2001; Myocardial perfusion imaging EF 26% old infarct anterior wall. 04/2010  . 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 Hearing deficit     Bilateral ears    Past Surgical History  Procedure Laterality Date  . Cardiac catheterization  08/05/2000    THERE IS ANTERIOR AND APICAL AKINESIS. EF IS ESTIMATED 20%, WITH INFEROBASILAR PORTIONS CONTRACTING REASONABLY WELL.  Marland Kitchen Coronary artery bypass graft      x5. lima to the lad, saphenous vein graft to the  intermediate and obtuse mariginal, and a saphenous vein graft to the acute mariginal and distal right coronary artery  . Cardiac defibrillator placement    . Colonoscopy with propofol N/A 01/09/2013    Procedure: COLONOSCOPY WITH PROPOFOL;  Surgeon: Shirley FriarVincent C. Schooler, MD;  Location: [facility] ENDOSCOPY;  Service: Endoscopy;  Laterality: N/A;  . Esophagogastroduodenoscopy N/A 07/11/2013    Procedure: ESOPHAGOGASTRODUODENOSCOPY (EGD);  Surgeon: Shirley FriarVincent C. Schooler, MD;  [facility];  Service: Endoscopy;  Laterality: N/A;  . Savory dilation N/A 07/11/2013    Procedure: SAVORY DILATION;  Surgeon: Shirley FriarVincent C. Schooler, MD;  [facility];  Service: Endoscopy;  Laterality: N/A;  . Balloon dilation N/A 07/11/2013    Procedure: BALLOON DILATION;  Surgeon: Shirley FriarVincent C. Schooler, MD;  [facility];  Service: Endoscopy;  Laterality: N/A;    History  Smoking status  . Never Smoker   Smokeless  tobacco  . Current User  . Types: Chew    History  Alcohol Use No    Family History  Problem Relation Age of Onset  . Heart attack Mother   . Cancer Father     Review of Systems: The review of systems is per the HPI.  All other systems were reviewed and are negative.  Physical Exam: BP 108/70  Pulse 71  Ht 5\' 4"  (1.626 m)  Wt 145 lb 1.9 oz (65.826 kg)  BMI 24.90 kg/m2  SpO2 94% Patient is very pleasant and in no acute distress. He is hard of hearing. Skin is warm and dry. Color is normal.  HEENT is unremarkable. Normocephalic/atraumatic. PERRL. Sclera are nonicteric. Neck is supple. No masses. No JVD. Lungs are clear. Cardiac exam shows a regular rate and rhythm. Abdomen is soft. Extremities are without edema. Gait and ROM are intact. No gross neurologic deficits noted.  Wt Readings from Last 3 Encounters:  12/03/13 145 lb 1.9 oz (65.826 kg)  07/11/13 140 lb (63.504 kg)  07/11/13 140 lb (63.504 kg)     LABORATORY DATA: PENDING  Lab Results  Component Value Date   WBC 11.1* 02/15/2013   HGB 13.4 02/15/2013   HCT 38.6* 02/15/2013   PLT 348 02/15/2013   GLUCOSE 89 05/01/2013   CHOL 139 10/03/2012   TRIG 233.0* 10/03/2012   HDL 36.00* 10/03/2012   LDLDIRECT 75.9 10/03/2012   LDLCALC 70 11/01/2011   ALT 8 02/14/2013   AST 17 02/14/2013   NA 137 05/01/2013   K 4.7 05/01/2013   CL 101 05/01/2013   CREATININE 1.3 05/01/2013   BUN 16 05/01/2013   CO2 28 05/01/2013   TSH 1.26 10/03/2012   INR 1.05 02/14/2013   HGBA1C 5.8* 02/15/2013     Assessment / Plan: 1. Ischemic CM - with remote MI/CABG - managed medically - he is doing well clinically. No change with current therapy. Recheck his labs today. See him in 6 months.  2. Underlying ICD - followed by Dr. Graciela HusbandsKlein.   3. Carotid disease - has had his doppler study prior to his visit with me. Results pending  4. HLD - check labs today  See him back in 6 months. No change in current regimen.   Patient is agreeable to this plan and  will call if any problems develop in the interim.   Adam MacadamiaLori C. Gillespie Minichiello, RN, ANP-C [facility] [address] [place], [place]  [id number] [phone]

## 2013-12-05 ENCOUNTER — Encounter: Payer: Self-pay | Admitting: Internal Medicine

## 2014-02-11 ENCOUNTER — Ambulatory Visit (INDEPENDENT_AMBULATORY_CARE_PROVIDER_SITE_OTHER): Payer: Medicare HMO | Admitting: *Deleted

## 2014-02-11 DIAGNOSIS — I428 Other cardiomyopathies: Secondary | ICD-10-CM

## 2014-02-11 LAB — MDC_IDC_ENUM_SESS_TYPE_REMOTE
Battery Voltage: 2.64 V
Brady Statistic RV Percent Paced: 0 %
Date Time Interrogation Session: 20150427163000
HIGH POWER IMPEDANCE MEASURED VALUE: 51 Ohm
Lead Channel Setting Pacing Amplitude: 2.5 V
Lead Channel Setting Pacing Pulse Width: 0.4 ms
MDC IDC MSMT LEADCHNL RV IMPEDANCE VALUE: 512 Ohm
MDC IDC MSMT LEADCHNL RV SENSING INTR AMPL: 7.7 mV
MDC IDC SET LEADCHNL RV SENSING SENSITIVITY: 0.3 mV
Zone Setting Detection Interval: 240 ms
Zone Setting Detection Interval: 300 ms
Zone Setting Detection Interval: 350 ms

## 2014-03-01 ENCOUNTER — Encounter: Payer: Self-pay | Admitting: Cardiology

## 2014-03-05 ENCOUNTER — Encounter: Payer: Self-pay | Admitting: Internal Medicine

## 2014-03-13 ENCOUNTER — Ambulatory Visit (INDEPENDENT_AMBULATORY_CARE_PROVIDER_SITE_OTHER): Payer: Medicare HMO | Admitting: *Deleted

## 2014-03-13 ENCOUNTER — Telehealth: Payer: Self-pay | Admitting: Cardiology

## 2014-03-13 DIAGNOSIS — I2589 Other forms of chronic ischemic heart disease: Secondary | ICD-10-CM

## 2014-03-13 DIAGNOSIS — I255 Ischemic cardiomyopathy: Secondary | ICD-10-CM

## 2014-03-13 NOTE — Telephone Encounter (Signed)
Spoke with pt niece and verified that remote transmission needs to be completed today.

## 2014-03-14 NOTE — Progress Notes (Signed)
Remote ICD transmission.   

## 2014-04-08 LAB — MDC_IDC_ENUM_SESS_TYPE_REMOTE
Battery Voltage: 2.64 V
Brady Statistic RV Percent Paced: 0 %
Date Time Interrogation Session: 20150527215000
HighPow Impedance: 51 Ohm
Lead Channel Impedance Value: 512 Ohm
Lead Channel Sensing Intrinsic Amplitude: 8.1 mV
Lead Channel Setting Pacing Pulse Width: 0.4 ms
MDC IDC SET LEADCHNL RV PACING AMPLITUDE: 2.5 V
MDC IDC SET LEADCHNL RV SENSING SENSITIVITY: 0.3 mV
Zone Setting Detection Interval: 240 ms
Zone Setting Detection Interval: 300 ms
Zone Setting Detection Interval: 350 ms

## 2014-04-23 ENCOUNTER — Encounter: Payer: Self-pay | Admitting: Cardiology

## 2014-04-30 ENCOUNTER — Encounter: Payer: Self-pay | Admitting: Internal Medicine

## 2014-05-01 ENCOUNTER — Ambulatory Visit (INDEPENDENT_AMBULATORY_CARE_PROVIDER_SITE_OTHER): Payer: Medicare HMO | Admitting: Internal Medicine

## 2014-05-01 ENCOUNTER — Encounter: Payer: Self-pay | Admitting: Internal Medicine

## 2014-05-01 VITALS — BP 126/72 | HR 69 | Ht 64.0 in | Wt 149.0 lb

## 2014-05-01 DIAGNOSIS — I2589 Other forms of chronic ischemic heart disease: Secondary | ICD-10-CM

## 2014-05-01 DIAGNOSIS — I255 Ischemic cardiomyopathy: Secondary | ICD-10-CM

## 2014-05-01 DIAGNOSIS — I25810 Atherosclerosis of coronary artery bypass graft(s) without angina pectoris: Secondary | ICD-10-CM

## 2014-05-01 DIAGNOSIS — T82198A Other mechanical complication of other cardiac electronic device, initial encounter: Secondary | ICD-10-CM

## 2014-05-01 DIAGNOSIS — I5022 Chronic systolic (congestive) heart failure: Secondary | ICD-10-CM

## 2014-05-01 DIAGNOSIS — I509 Heart failure, unspecified: Secondary | ICD-10-CM

## 2014-05-01 DIAGNOSIS — Z95810 Presence of automatic (implantable) cardiac defibrillator: Secondary | ICD-10-CM

## 2014-05-01 DIAGNOSIS — T829XXA Unspecified complication of cardiac and vascular prosthetic device, implant and graft, initial encounter: Secondary | ICD-10-CM | POA: Insufficient documentation

## 2014-05-01 HISTORY — DX: Other mechanical complication of other cardiac electronic device, initial encounter: T82.198A

## 2014-05-01 NOTE — Assessment & Plan Note (Signed)
This will require surgical revision at the time of generator replacement

## 2014-05-01 NOTE — Patient Instructions (Signed)
Your physician recommends that you continue on your current medications as directed. Please refer to the Current Medication list given to you today.  Remote monitoring is used to monitor your Pacemaker of ICD from home. This monitoring reduces the number of office visits required to check your device to one time per year. It allows [place] to keep an eye on the functioning of your device to ensure it is working properly. You are scheduled for a device check from home on 05/29/14. You may send your transmission at any time that day. If you have a wireless device, the transmission will be sent automatically. After your physician reviews your transmission, you will receive a postcard with your next transmission date.  (We will monitor your battery monthly as it is nearing elective replacement)

## 2014-05-01 NOTE — Assessment & Plan Note (Signed)
Stable Without symptoms of ischemia

## 2014-05-01 NOTE — Assessment & Plan Note (Signed)
Approaching ERI  Will do monthly checks

## 2014-05-01 NOTE — Progress Notes (Signed)
kf Patient Care Team: Adam PeakNathan Conroy, PA-C as PCP - General (Physician Assistant)   HPI  [name] is a 58 y.o. male Seen in followup for an ICD implanted 2006 the setting of ischemic heart disease and prior bypass surgery x5 in 2001     Records indicate nuclear study in 04/2010 demonstrated old anterior infarct, EF 26%. Echocardiogram 04/2012: EF 25%, inferior, inferoseptal, distal anterior and apical HK, mild AI, mild MR.   The patient denies chest pain, shortness of breath, nocturnal dyspnea, orthopnea or peripheral edema.  There have been no palpitations, lightheadedness or syncope.          Past Medical History  Diagnosis Date  . Anoxic encephalopathy     POSTOP FROM 2001  . LV dysfunction     EF is 25% per echo in July 2013  . Carotid stenosis, right     60-79% right, 40 - 59% left - needs dopplers in June 2014  . Hyperlipidemia   . Gout   . Hypothyroidism   . Ischemic cardiomyopathy     Remote MI with CABG x 5 in 2001; Myocardial perfusion imaging EF 26% old infarct anterior wall. 04/2010  . 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 Hearing deficit     Bilateral ears    Past Surgical History  Procedure Laterality Date  . Cardiac catheterization  08/05/2000    THERE IS ANTERIOR AND APICAL AKINESIS. EF IS ESTIMATED 20%, WITH INFEROBASILAR PORTIONS CONTRACTING REASONABLY WELL.  Marland Kitchen. Coronary artery bypass graft      x5. lima to the lad, saphenous vein graft to the intermediate and obtuse mariginal, and a saphenous vein graft to the acute mariginal and distal right coronary artery  . Cardiac defibrillator placement    . Colonoscopy with propofol N/A 01/09/2013    Procedure: COLONOSCOPY WITH PROPOFOL;  Surgeon: Shirley FriarVincent C. Schooler, MD;  Location: [facility] ENDOSCOPY;  Service: Endoscopy;  Laterality: N/A;  . Esophagogastroduodenoscopy N/A 07/11/2013    Procedure: ESOPHAGOGASTRODUODENOSCOPY (EGD);  Surgeon: Shirley FriarVincent C. Schooler, MD;  [facility];  Service:  Endoscopy;  Laterality: N/A;  . Savory dilation N/A 07/11/2013    Procedure: SAVORY DILATION;  Surgeon: Shirley FriarVincent C. Schooler, MD;  [facility];  Service: Endoscopy;  Laterality: N/A;  . Balloon dilation N/A 07/11/2013    Procedure: BALLOON DILATION;  Surgeon: Shirley FriarVincent C. Schooler, MD;  [facility];  Service: Endoscopy;  Laterality: N/A;    Current Outpatient Prescriptions  Medication Sig Dispense Refill  . allopurinol (ZYLOPRIM) 100 MG tablet Take 1 tablet (100 mg total) by mouth daily.  30 tablet  11  . aspirin 81 MG tablet Take 81 mg by mouth daily.        . furosemide (LASIX) 20 MG tablet TAKE 20 MG ONLY ON MON, AND THURSDAY'S  30 tablet  11  . hydrALAZINE (APRESOLINE) 25 MG tablet TAKE ONE-HALF (1/2) TABLET TWICE A DAY  30 tablet  11  . isosorbide mononitrate (IMDUR) 30 MG 24 hr tablet Take 1 tablet (30 mg total) by mouth daily.  30 tablet  11  . levothyroxine (SYNTHROID, LEVOTHROID) 125 MCG tablet Take 1 tablet (125 mcg total) by mouth daily.  30 tablet  11  . meloxicam (MOBIC) 7.5 MG tablet Take 7.5 mg by mouth. As needed for gout flare up      . metoprolol succinate (TOPROL-XL) 50 MG 24 hr tablet Take 1 tablet (50 mg total) by mouth daily.  30 tablet  11  . omeprazole (  PRILOSEC OTC) 20 MG tablet Take 40 mg by mouth daily.       . simvastatin (ZOCOR) 40 MG tablet Take 1 tablet (40 mg total) by mouth at bedtime.  30 tablet  11  . VESICARE 5 MG tablet Take 5 mg by mouth every other day.        No current facility-administered medications for this visit.    Allergies  Allergen Reactions  . Ace Inhibitors     Contributed to hyperkalemia and renal insufficiency in the past (see office note 10/03/2012)    Review of Systems negative except from HPI and PMH  Physical Exam BP 126/72  Pulse 69  Ht 5\' 4"  (1.626 m)  Wt 149 lb (67.586 kg)  BMI 25.56 kg/m2 Well developed and well nourished in no acute distress HENT normal E scleral and icterus clear Neck  Supple Clear to ausculation anteriorly and posteriorly Device pocket well healed; without hematoma or erythema.  There is no tethering Regular rate and rhythm, no murmurs gallops or rub Soft with active bowel sounds No clubbing cyanosis none Edema Alert and oriented, grossly normal motor and sensory function Skin Warm and Dry  of  Assessment and  Plan

## 2014-05-01 NOTE — Assessment & Plan Note (Signed)
No ischemia. Continue current meds

## 2014-05-01 NOTE — Assessment & Plan Note (Signed)
euvolemic  Continue current meds

## 2014-05-29 ENCOUNTER — Ambulatory Visit (INDEPENDENT_AMBULATORY_CARE_PROVIDER_SITE_OTHER): Payer: Medicare HMO | Admitting: *Deleted

## 2014-05-29 DIAGNOSIS — Z95810 Presence of automatic (implantable) cardiac defibrillator: Secondary | ICD-10-CM

## 2014-05-29 NOTE — Progress Notes (Signed)
Remote ICD transmission.   

## 2014-06-03 ENCOUNTER — Encounter (HOSPITAL_COMMUNITY): Payer: Medicare HMO

## 2014-06-03 ENCOUNTER — Ambulatory Visit (INDEPENDENT_AMBULATORY_CARE_PROVIDER_SITE_OTHER): Payer: Medicare HMO | Admitting: Nurse Practitioner

## 2014-06-03 ENCOUNTER — Ambulatory Visit (HOSPITAL_COMMUNITY): Payer: Medicare HMO | Attending: Internal Medicine | Admitting: Cardiology

## 2014-06-03 ENCOUNTER — Ambulatory Visit: Payer: Medicare HMO | Admitting: Nurse Practitioner

## 2014-06-03 ENCOUNTER — Encounter: Payer: Self-pay | Admitting: Nurse Practitioner

## 2014-06-03 ENCOUNTER — Ambulatory Visit (INDEPENDENT_AMBULATORY_CARE_PROVIDER_SITE_OTHER): Payer: Medicare HMO | Admitting: *Deleted

## 2014-06-03 VITALS — BP 110/70 | HR 72 | Ht 64.0 in | Wt 147.4 lb

## 2014-06-03 DIAGNOSIS — I779 Disorder of arteries and arterioles, unspecified: Secondary | ICD-10-CM | POA: Diagnosis present

## 2014-06-03 DIAGNOSIS — I2589 Other forms of chronic ischemic heart disease: Secondary | ICD-10-CM | POA: Insufficient documentation

## 2014-06-03 DIAGNOSIS — I255 Ischemic cardiomyopathy: Secondary | ICD-10-CM

## 2014-06-03 DIAGNOSIS — I6529 Occlusion and stenosis of unspecified carotid artery: Secondary | ICD-10-CM

## 2014-06-03 DIAGNOSIS — I5022 Chronic systolic (congestive) heart failure: Secondary | ICD-10-CM | POA: Insufficient documentation

## 2014-06-03 DIAGNOSIS — I2510 Atherosclerotic heart disease of native coronary artery without angina pectoris: Secondary | ICD-10-CM | POA: Insufficient documentation

## 2014-06-03 DIAGNOSIS — I739 Peripheral vascular disease, unspecified: Secondary | ICD-10-CM

## 2014-06-03 DIAGNOSIS — E785 Hyperlipidemia, unspecified: Secondary | ICD-10-CM

## 2014-06-03 LAB — BASIC METABOLIC PANEL
BUN: 20 mg/dL (ref 6–23)
CO2: 27 mEq/L (ref 19–32)
Calcium: 9.1 mg/dL (ref 8.4–10.5)
Chloride: 106 mEq/L (ref 96–112)
Creatinine, Ser: 1.4 mg/dL (ref 0.4–1.5)
GFR: 53.76 mL/min — ABNORMAL LOW (ref >60.00)
Glucose, Bld: 66 mg/dL — ABNORMAL LOW (ref 70–99)
Potassium: 4.6 mEq/L (ref 3.5–5.1)
Sodium: 141 mEq/L (ref 135–145)

## 2014-06-03 LAB — HEPATIC FUNCTION PANEL
ALT: 17 U/L (ref 0–53)
AST: 20 U/L (ref 0–37)
Albumin: 3.5 g/dL (ref 3.5–5.2)
Alkaline Phosphatase: 63 U/L (ref 39–117)
Bilirubin, Direct: 0 mg/dL (ref 0.0–0.3)
Total Bilirubin: 0.4 mg/dL (ref 0.2–1.2)
Total Protein: 6.2 g/dL (ref 6.0–8.3)

## 2014-06-03 LAB — LDL CHOLESTEROL, DIRECT: Direct LDL: 87.5 mg/dL

## 2014-06-03 LAB — LIPID PANEL
Cholesterol: 142 mg/dL (ref 0–200)
HDL: 32.5 mg/dL — ABNORMAL LOW (ref >39.00)
NonHDL: 109.5
Total CHOL/HDL Ratio: 4
Triglycerides: 210 mg/dL — ABNORMAL HIGH (ref 0.0–149.0)
VLDL: 42 mg/dL — ABNORMAL HIGH (ref 0.0–40.0)

## 2014-06-03 NOTE — Patient Instructions (Addendum)
Stay on current medicines  See me in 6 months  Repeat carotid doppler in 6 months  We will call you about your labs  Call the [facility] office at [phone] if you have any questions, problems or concerns.

## 2014-06-03 NOTE — Progress Notes (Signed)
[name] Date of Birth: 07/20/1956 Medical Record #[id number]  History of Present Illness: [place] is seen back today for a follow up visit. It is his 6 month check. Seen for [name] - former patient of Dr. [name] that I previously cared for. He has known CAD with past NSTEMI in 2001 and an ischemic CM, has ICD in place, had CABG x 5 back in 2001 - complicated by anoxic encephalopathy. Other issues include carotid disease, HLD, gout, and hypothyroidism. He has had a chronic tendency towards hyperkalemia and is NOT on ACE. EF of 25% per echo from 2013.   Last seen by [name] back in July of 2014. Device was doing ok. I saw him back in February of this year - he was doing ok.   Comes back today. Here with his mom and sister. Doing ok. No chest pain. Not short of breath. No swelling. No ICD discharges. Had his carotid doppler repeated this morning - results pending. Medicines are going ok. Device nearing ERI - apparently having issues about hearing "the beep". Needs some paperwork filled about regarding his disability premiums. Overall, seems to be doing ok. He has been working in the garden this summer.    Current Outpatient Prescriptions  Medication Sig Dispense Refill  . allopurinol (ZYLOPRIM) 100 MG tablet Take 1 tablet (100 mg total) by mouth daily.  30 tablet  11  . aspirin 81 MG tablet Take 81 mg by mouth daily.        . furosemide (LASIX) 20 MG tablet TAKE 20 MG ONLY ON MON, AND THURSDAY'S  30 tablet  11  . hydrALAZINE (APRESOLINE) 25 MG tablet TAKE ONE-HALF (1/2) TABLET TWICE A DAY  30 tablet  11  . isosorbide mononitrate (IMDUR) 30 MG 24 hr tablet Take 1 tablet (30 mg total) by mouth daily.  30 tablet  11  . levothyroxine (SYNTHROID, LEVOTHROID) 125 MCG tablet Take 1 tablet (125 mcg total) by mouth daily.  30 tablet  11  . meloxicam (MOBIC) 7.5 MG tablet Take 7.5 mg by mouth. As needed for gout flare up      . metoprolol succinate (TOPROL-XL) 50 MG 24 hr tablet Take 1 tablet  (50 mg total) by mouth daily.  30 tablet  11  . omeprazole (PRILOSEC OTC) 20 MG tablet Take 40 mg by mouth daily.       . OXYBUTYNIN CHLORIDE ER PO Take 10 mg by mouth daily.      . simvastatin (ZOCOR) 40 MG tablet Take 1 tablet (40 mg total) by mouth at bedtime.  30 tablet  11   No current facility-administered medications for this visit.    Allergies  Allergen Reactions  . Ace Inhibitors     Contributed to hyperkalemia and renal insufficiency in the past (see office note 10/03/2012)    Past Medical History  Diagnosis Date  . Anoxic encephalopathy     POSTOP FROM 2001  . LV dysfunction     EF is 25% per echo in July 2013  . Carotid stenosis, right     60-79% right, 40 - 59% left - needs dopplers in June 2014  . Hyperlipidemia   . Gout   . Hypothyroidism   . Ischemic cardiomyopathy     Remote MI with CABG x 5 in 2001; Myocardial perfusion imaging EF 26% old infarct anterior wall. 04/2010  . 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 Hearing deficit     Bilateral ears  .  4782 lead 05/01/2014  . Automatic implantable cardiac defibrillator -Medtronic 04/23/2011    Past Surgical History  Procedure Laterality Date  . Cardiac catheterization  08/05/2000    THERE IS ANTERIOR AND APICAL AKINESIS. EF IS ESTIMATED 20%, WITH INFEROBASILAR PORTIONS CONTRACTING REASONABLY WELL.  Marland Kitchen Coronary artery bypass graft      x5. lima to the lad, saphenous vein graft to the intermediate and obtuse mariginal, and a saphenous vein graft to the acute mariginal and distal right coronary artery  . Cardiac defibrillator placement    . Colonoscopy with propofol N/A 01/09/2013    Procedure: COLONOSCOPY WITH PROPOFOL;  Surgeon: Shirley Friar, MD;  Location: [facility] ENDOSCOPY;  Service: Endoscopy;  Laterality: N/A;  . Esophagogastroduodenoscopy N/A 07/11/2013    Procedure: ESOPHAGOGASTRODUODENOSCOPY (EGD);  Surgeon: Shirley Friar, MD;  [facility];  Service: Endoscopy;  Laterality: N/A;  .  Savory dilation N/A 07/11/2013    Procedure: SAVORY DILATION;  Surgeon: Shirley Friar, MD;  Location: [facility] ENDOSCOPY;  Service: Endoscopy;  Laterality: N/A;  . Balloon dilation N/A 07/11/2013    Procedure: BALLOON DILATION;  Surgeon: Shirley Friar, MD;  Location: [facility] ENDOSCOPY;  Service: Endoscopy;  Laterality: N/A;    History  Smoking status  . Never Smoker   Smokeless tobacco  . Current User  . Types: Chew    History  Alcohol Use No    Family History  Problem Relation Age of Onset  . Heart attack Mother   . Cancer Father     Review of Systems: The review of systems is per the HPI.  All other systems were reviewed and are negative.  Physical Exam: BP 110/70  Pulse 72  Ht 5\' 4"  (1.626 m)  Wt 147 lb 6.4 oz (66.86 kg)  BMI 25.29 kg/m2 Patient is very pleasant and in no acute distress.he is hard of hearing. Skin is warm and dry. Color is normal.  HEENT is unremarkable. Normocephalic/atraumatic. PERRL. Sclera are nonicteric. Neck is supple. No masses. No JVD. Lungs are clear. Cardiac exam shows a regular rate and rhythm. Abdomen is soft. Extremities are without edema. Gait and ROM are intact. No gross neurologic deficits noted.  Wt Readings from Last 3 Encounters:  06/03/14 147 lb 6.4 oz (66.86 kg)  05/01/14 149 lb (67.586 kg)  12/03/13 145 lb 1.9 oz (65.826 kg)    LABORATORY DATA/PROCEDURES:  Lab Results  Component Value Date   WBC 11.1* 02/15/2013   HGB 13.4 02/15/2013   HCT 38.6* 02/15/2013   PLT 348 02/15/2013   GLUCOSE 104* 12/03/2013   CHOL 142 12/03/2013   TRIG 228.0* 12/03/2013   HDL 38.60* 12/03/2013   LDLDIRECT 83.0 12/03/2013   LDLCALC 70 11/01/2011   ALT 22 12/03/2013   AST 18 12/03/2013   NA 140 12/03/2013   K 4.6 12/03/2013   CL 105 12/03/2013   CREATININE 1.3 12/03/2013   BUN 17 12/03/2013   CO2 28 12/03/2013   TSH 1.00 12/03/2013   INR 1.05 02/14/2013   HGBA1C 5.8* 02/15/2013    BNP (last 3 results) No results found for this basename: PROBNP,  in the last  8760 hours   Assessment / Plan: 1. Ischemic CM - with remote MI/CABG - managed medically - he is doing well clinically. No change with current therapy. Recheck his labs today. See him in 6 months.   2. Underlying ICD - followed by [name]. Apparently his device is nearing ERI - he is doing monthly checks by phone.  3. Carotid disease - has had his doppler study prior to his visit with me. Results showing stable bilateral 60 to 70% stenosis (RCA is high end of range) - recheck in 6 months.  4. HLD - check labs today   See him back in 6 months. No change in current regimen. I filled out his paperwork for him.   Patient is agreeable to this plan and will call if any problems develop in the interim.   Rosalio MacadamiaLori C. Keevan Wolz, RN, ANP-C [facility] [address] [place], [place]  [id number] [phone]

## 2014-06-03 NOTE — Progress Notes (Signed)
Carotid duplex performed 

## 2014-06-12 ENCOUNTER — Telehealth: Payer: Self-pay | Admitting: *Deleted

## 2014-06-12 LAB — MDC_IDC_ENUM_SESS_TYPE_REMOTE
Battery Voltage: 2.62 V
Date Time Interrogation Session: 20150812154800
HighPow Impedance: 51 Ohm
Lead Channel Sensing Intrinsic Amplitude: 7.4 mV
Lead Channel Setting Pacing Amplitude: 2.5 V
Lead Channel Setting Sensing Sensitivity: 0.3 mV
MDC IDC MSMT LEADCHNL RV IMPEDANCE VALUE: 504 Ohm
MDC IDC SET LEADCHNL RV PACING PULSEWIDTH: 0.4 ms
Zone Setting Detection Interval: 240 ms
Zone Setting Detection Interval: 300 ms
Zone Setting Detection Interval: 350 ms

## 2014-06-12 NOTE — Telephone Encounter (Signed)
Pt at ERI. LMTCB.

## 2014-06-12 NOTE — Telephone Encounter (Signed)
Informed niece that pt's device is at [facility]. Niece voiced understanding. Will defer scheduling to [facility].

## 2014-07-10 ENCOUNTER — Ambulatory Visit (INDEPENDENT_AMBULATORY_CARE_PROVIDER_SITE_OTHER): Payer: Medicare HMO | Admitting: Internal Medicine

## 2014-07-10 ENCOUNTER — Encounter: Payer: Self-pay | Admitting: Internal Medicine

## 2014-07-10 VITALS — BP 110/62 | HR 70 | Ht 64.0 in | Wt 149.0 lb

## 2014-07-10 DIAGNOSIS — I2589 Other forms of chronic ischemic heart disease: Secondary | ICD-10-CM

## 2014-07-10 DIAGNOSIS — I509 Heart failure, unspecified: Secondary | ICD-10-CM

## 2014-07-10 DIAGNOSIS — Z95810 Presence of automatic (implantable) cardiac defibrillator: Secondary | ICD-10-CM

## 2014-07-10 DIAGNOSIS — I255 Ischemic cardiomyopathy: Secondary | ICD-10-CM

## 2014-07-10 DIAGNOSIS — Z4502 Encounter for adjustment and management of automatic implantable cardiac defibrillator: Secondary | ICD-10-CM

## 2014-07-10 DIAGNOSIS — Z789 Other specified health status: Secondary | ICD-10-CM | POA: Insufficient documentation

## 2014-07-10 DIAGNOSIS — I5022 Chronic systolic (congestive) heart failure: Secondary | ICD-10-CM

## 2014-07-10 DIAGNOSIS — Z01812 Encounter for preprocedural laboratory examination: Secondary | ICD-10-CM

## 2014-07-10 LAB — MDC_IDC_ENUM_SESS_TYPE_INCLINIC
Date Time Interrogation Session: 20150923085407
HIGH POWER IMPEDANCE MEASURED VALUE: 49 Ohm
HIGH POWER IMPEDANCE MEASURED VALUE: 49 Ohm
HIGH POWER IMPEDANCE MEASURED VALUE: 51 Ohm
HIGH POWER IMPEDANCE MEASURED VALUE: 51 Ohm
HIGH POWER IMPEDANCE MEASURED VALUE: 52 Ohm
HIGH POWER IMPEDANCE MEASURED VALUE: 54 Ohm
HIGH POWER IMPEDANCE MEASURED VALUE: 66 Ohm
HIGH POWER IMPEDANCE MEASURED VALUE: 68 Ohm
HIGH POWER IMPEDANCE MEASURED VALUE: 70 Ohm
HIGH POWER IMPEDANCE MEASURED VALUE: 71 Ohm
HighPow Impedance: 49 Ohm
HighPow Impedance: 50 Ohm
HighPow Impedance: 51 Ohm
HighPow Impedance: 51 Ohm
HighPow Impedance: 51 Ohm
HighPow Impedance: 52 Ohm
HighPow Impedance: 52 Ohm
HighPow Impedance: 52 Ohm
HighPow Impedance: 52 Ohm
HighPow Impedance: 53 Ohm
HighPow Impedance: 67 Ohm
HighPow Impedance: 67 Ohm
HighPow Impedance: 70 Ohm
HighPow Impedance: 71 Ohm
HighPow Impedance: 72 Ohm
HighPow Impedance: 72 Ohm
HighPow Impedance: 73 Ohm
HighPow Impedance: 73 Ohm
HighPow Impedance: 73 Ohm
HighPow Impedance: 73 Ohm
HighPow Impedance: 73 Ohm
Lead Channel Impedance Value: 520 Ohm
Lead Channel Impedance Value: 520 Ohm
Lead Channel Impedance Value: 520 Ohm
Lead Channel Impedance Value: 520 Ohm
Lead Channel Impedance Value: 520 Ohm
Lead Channel Impedance Value: 520 Ohm
Lead Channel Impedance Value: 520 Ohm
Lead Channel Impedance Value: 520 Ohm
Lead Channel Impedance Value: 520 Ohm
Lead Channel Impedance Value: 520 Ohm
Lead Channel Impedance Value: 520 Ohm
Lead Channel Impedance Value: 544 Ohm
Lead Channel Sensing Intrinsic Amplitude: 6.5 mV
Lead Channel Sensing Intrinsic Amplitude: 6.6 mV
Lead Channel Sensing Intrinsic Amplitude: 6.9 mV
Lead Channel Sensing Intrinsic Amplitude: 7.2 mV
Lead Channel Sensing Intrinsic Amplitude: 7.6 mV
Lead Channel Sensing Intrinsic Amplitude: 7.7 mV
Lead Channel Sensing Intrinsic Amplitude: 7.8 mV
Lead Channel Sensing Intrinsic Amplitude: 7.9 mV
Lead Channel Sensing Intrinsic Amplitude: 7.9 mV
Lead Channel Sensing Intrinsic Amplitude: 8 mV
Lead Channel Sensing Intrinsic Amplitude: 8.7 mV
Lead Channel Setting Pacing Amplitude: 2.5 V
Lead Channel Setting Pacing Pulse Width: 0.4 ms
Lead Channel Setting Sensing Sensitivity: 0.3 mV
MDC IDC MSMT BATTERY VOLTAGE: 2.62 V
MDC IDC MSMT LEADCHNL RV IMPEDANCE VALUE: 512 Ohm
MDC IDC MSMT LEADCHNL RV IMPEDANCE VALUE: 512 Ohm
MDC IDC MSMT LEADCHNL RV IMPEDANCE VALUE: 528 Ohm
MDC IDC MSMT LEADCHNL RV SENSING INTR AMPL: 6.9 mV
MDC IDC MSMT LEADCHNL RV SENSING INTR AMPL: 7.3 mV
MDC IDC MSMT LEADCHNL RV SENSING INTR AMPL: 7.9 mV
MDC IDC MSMT LEADCHNL RV SENSING INTR AMPL: 8 mV
MDC IDC STAT BRADY RV PERCENT PACED: 0 %
Zone Setting Detection Interval: 240 ms
Zone Setting Detection Interval: 300 ms
Zone Setting Detection Interval: 350 ms

## 2014-07-10 NOTE — Progress Notes (Signed)
kf Patient Care Team: Lonie Peak, PA-C as PCP - General (Physician Assistant)   HPI  Adam Gillespie is a 58 y.o. male Seen in followup for an ICD implanted 2006 the setting of ischemic heart disease and prior bypass surgery x5 in 2001  Device at [facility]  He also has 6949 lead   Records indicate nuclear study in 04/2010 demonstrated old anterior infarct, EF 26%. Echocardiogram 04/2012: EF 25%, inferior, inferoseptal, distal anterior and apical HK, mild AI, mild MR.   The patient denies chest pain, shortness of breath, nocturnal dyspnea, orthopnea or peripheral edema.  There have been no palpitations, lightheadedness or syncope  His sleep disordered breathing and is falling asleep on me as we talked today.          Past Medical History  Diagnosis Date  . Anoxic encephalopathy     POSTOP FROM 2001  . LV dysfunction     EF is 25% per echo in July 2013  . Carotid stenosis, right     60-79% right, 40 - 59% left - needs dopplers in June 2014  . Hyperlipidemia   . Gout   . Hypothyroidism   . Ischemic cardiomyopathy     Remote MI with CABG x 5 in 2001; Myocardial perfusion imaging EF 26% old infarct anterior wall. 04/2010  . 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 Hearing deficit     Bilateral ears  . 9774 lead 05/01/2014  . Automatic implantable cardiac defibrillator -Medtronic 04/23/2011    Past Surgical History  Procedure Laterality Date  . Cardiac catheterization  08/05/2000    THERE IS ANTERIOR AND APICAL AKINESIS. EF IS ESTIMATED 20%, WITH INFEROBASILAR PORTIONS CONTRACTING REASONABLY WELL.  Marland Kitchen Coronary artery bypass graft      x5. lima to the lad, saphenous vein graft to the intermediate and obtuse mariginal, and a saphenous vein graft to the acute mariginal and distal right coronary artery  . Cardiac defibrillator placement    . Colonoscopy with propofol N/A 01/09/2013    Procedure: COLONOSCOPY WITH PROPOFOL;  Surgeon: Shirley Friar, MD;  Location: [facility] ENDOSCOPY;   Service: Endoscopy;  Laterality: N/A;  . Esophagogastroduodenoscopy N/A 07/11/2013    Procedure: ESOPHAGOGASTRODUODENOSCOPY (EGD);  Surgeon: Shirley Friar, MD;  Location: [facility] ENDOSCOPY;  Service: Endoscopy;  Laterality: N/A;  . Savory dilation N/A 07/11/2013    Procedure: SAVORY DILATION;  Surgeon: Shirley Friar, MD;  Location: [facility] ENDOSCOPY;  Service: Endoscopy;  Laterality: N/A;  . Balloon dilation N/A 07/11/2013    Procedure: BALLOON DILATION;  Surgeon: Shirley Friar, MD;  Location: [facility] ENDOSCOPY;  Service: Endoscopy;  Laterality: N/A;    Current Outpatient Prescriptions  Medication Sig Dispense Refill  . allopurinol (ZYLOPRIM) 100 MG tablet Take 1 tablet (100 mg total) by mouth daily.  30 tablet  11  . aspirin 81 MG tablet Take 81 mg by mouth daily.        . furosemide (LASIX) 20 MG tablet TAKE 20 MG ONLY ON MON, AND THURSDAY'S  30 tablet  11  . hydrALAZINE (APRESOLINE) 25 MG tablet TAKE ONE-HALF (1/2) TABLET TWICE A DAY  30 tablet  11  . isosorbide mononitrate (IMDUR) 30 MG 24 hr tablet Take 1 tablet (30 mg total) by mouth daily.  30 tablet  11  . levothyroxine (SYNTHROID, LEVOTHROID) 125 MCG tablet Take 1 tablet (125 mcg total) by mouth daily.  30 tablet  11  . meloxicam (MOBIC) 7.5 MG tablet Take 7.5 mg by mouth. As  needed for gout flare up      . metoprolol succinate (TOPROL-XL) 50 MG 24 hr tablet Take 1 tablet (50 mg total) by mouth daily.  30 tablet  11  . omeprazole (PRILOSEC OTC) 20 MG tablet Take 40 mg by mouth daily.       . OXYBUTYNIN CHLORIDE ER PO Take 10 mg by mouth daily.      . simvastatin (ZOCOR) 40 MG tablet Take 1 tablet (40 mg total) by mouth at bedtime.  30 tablet  11   No current facility-administered medications for this visit.    Allergies  Allergen Reactions  . Ace Inhibitors     Contributed to hyperkalemia and renal insufficiency in the past (see office note 10/03/2012)    Review of Systems negative except from HPI and PMH  Physical Exam BP  110/62  Pulse 70  Ht  (1.626 m)  Wt 149 lb (67.586 kg)  BMI 25.56 kg/m2 Well developed and well nourished in no acute distress HENT normal E scleral and icterus clear Neck Supple Clear to ausculation anteriorly and posteriorly Device pocket well healed; without hematoma or erythema.  There is no tethering Regular rate and rhythm, no murmurs gallops or rub Soft with active bowel sounds No clubbing cyanosis none Edema Alert and oriented, grossly normal motor and sensory function Skin Warm and Dry  b  Assessment and  Plan  Ischemic cardiomyopathy  Sleep-disordered breathing/daytime fatigue  Ace Intolerance--  On hydralazine nitrates  implantable defibrillator-Medtronic   6949-lead   We have reviewed the benefits and risks of generator replacement.  These include but are not limited to lead fracture and infection.  The patient understands, agrees and is willing to proceed.    He'll also need replacement of his 6949-lead.  He has significantly sleep disruption was snoring and daytime fatigue. We'll undertake a sleep study, preferably at home.  He needs a Myoview scan prior to device implantation for risk stratification.

## 2014-07-10 NOTE — Patient Instructions (Signed)
Your physician recommends that you continue on your current medications as directed. Please refer to the Current Medication list given to you today.  Your physician has requested that you have a lexiscan myoview. For further information please visit [email or web address] Please follow instruction sheet, as given.  Your physician has recommended that you have a defibrillator generator change and lead revision. You are scheduled for 07/31/14.  Kairi Harshbarger, RN will call you to review instructions.

## 2014-07-15 ENCOUNTER — Encounter: Payer: Self-pay | Admitting: Internal Medicine

## 2014-07-18 ENCOUNTER — Telehealth: Payer: Self-pay | Admitting: Internal Medicine

## 2014-07-18 ENCOUNTER — Encounter: Payer: Self-pay | Admitting: *Deleted

## 2014-07-18 NOTE — Telephone Encounter (Signed)
New message ° ° ° ° °Returning Sherri's call from yesterday °

## 2014-07-18 NOTE — Telephone Encounter (Signed)
Scheduled ICD gen change with lead revision for 10/14. Pre procedure labs 10/8. Wound check follow up 10/26. Reviewed letter of instructions with patient's dtr (per his approval) and left letter at front desk for them to pick up on 10/8. Patient's dtr verbalized understanding and agreeable to plan.

## 2014-07-19 ENCOUNTER — Encounter: Payer: Self-pay | Admitting: Internal Medicine

## 2014-07-23 ENCOUNTER — Encounter (HOSPITAL_COMMUNITY): Payer: Self-pay | Admitting: Pharmacy Technician

## 2014-07-25 ENCOUNTER — Other Ambulatory Visit (INDEPENDENT_AMBULATORY_CARE_PROVIDER_SITE_OTHER): Payer: Medicare HMO | Admitting: *Deleted

## 2014-07-25 ENCOUNTER — Ambulatory Visit (HOSPITAL_COMMUNITY): Payer: Medicare HMO | Attending: Cardiology | Admitting: Radiology

## 2014-07-25 VITALS — BP 104/61 | Ht 64.0 in | Wt 147.0 lb

## 2014-07-25 DIAGNOSIS — R5383 Other fatigue: Secondary | ICD-10-CM | POA: Diagnosis not present

## 2014-07-25 DIAGNOSIS — I255 Ischemic cardiomyopathy: Secondary | ICD-10-CM

## 2014-07-25 DIAGNOSIS — I2510 Atherosclerotic heart disease of native coronary artery without angina pectoris: Secondary | ICD-10-CM | POA: Diagnosis not present

## 2014-07-25 DIAGNOSIS — Z4502 Encounter for adjustment and management of automatic implantable cardiac defibrillator: Secondary | ICD-10-CM

## 2014-07-25 DIAGNOSIS — I10 Essential (primary) hypertension: Secondary | ICD-10-CM | POA: Diagnosis not present

## 2014-07-25 DIAGNOSIS — I5022 Chronic systolic (congestive) heart failure: Secondary | ICD-10-CM

## 2014-07-25 DIAGNOSIS — Z95810 Presence of automatic (implantable) cardiac defibrillator: Secondary | ICD-10-CM

## 2014-07-25 DIAGNOSIS — Z01812 Encounter for preprocedural laboratory examination: Secondary | ICD-10-CM

## 2014-07-25 LAB — CBC WITH DIFFERENTIAL/PLATELET
BASOS PCT: 0.5 % (ref 0.0–3.0)
Basophils Absolute: 0 10*3/uL (ref 0.0–0.1)
Eosinophils Absolute: 0.4 10*3/uL (ref 0.0–0.7)
Eosinophils Relative: 4 % (ref 0.0–5.0)
HCT: 49.4 % (ref 39.0–52.0)
Hemoglobin: 15.6 g/dL (ref 13.0–17.0)
Lymphocytes Relative: 21.3 % (ref 12.0–46.0)
Lymphs Abs: 2.2 10*3/uL (ref 0.7–4.0)
MCHC: 31.6 g/dL (ref 30.0–36.0)
MCV: 103.4 fl — ABNORMAL HIGH (ref 78.0–100.0)
MONOS PCT: 11.1 % (ref 3.0–12.0)
Monocytes Absolute: 1.2 10*3/uL — ABNORMAL HIGH (ref 0.1–1.0)
Neutro Abs: 6.6 10*3/uL (ref 1.4–7.7)
Neutrophils Relative %: 63.1 % (ref 43.0–77.0)
Platelets: 364 10*3/uL (ref 150.0–400.0)
RBC: 4.78 Mil/uL (ref 4.22–5.81)
RDW: 15.8 % — ABNORMAL HIGH (ref 11.5–15.5)
WBC: 10.4 10*3/uL (ref 4.0–10.5)

## 2014-07-25 LAB — BASIC METABOLIC PANEL
BUN: 22 mg/dL (ref 6–23)
CHLORIDE: 104 meq/L (ref 96–112)
CO2: 26 mEq/L (ref 19–32)
CREATININE: 1.5 mg/dL (ref 0.4–1.5)
Calcium: 9.5 mg/dL (ref 8.4–10.5)
GFR: 50.48 mL/min — AB (ref >60.00)
Glucose, Bld: 85 mg/dL (ref 70–99)
Potassium: 5 mEq/L (ref 3.5–5.1)
Sodium: 141 mEq/L (ref 135–145)

## 2014-07-25 MED ORDER — TECHNETIUM TC 99M SESTAMIBI GENERIC - CARDIOLITE
11.0000 | Freq: Once | INTRAVENOUS | Status: AC | PRN
  Administered 2014-07-25: 11 via INTRAVENOUS

## 2014-07-25 MED ORDER — TECHNETIUM TC 99M SESTAMIBI GENERIC - CARDIOLITE
33.0000 | Freq: Once | INTRAVENOUS | Status: AC | PRN
  Administered 2014-07-25: 33 via INTRAVENOUS

## 2014-07-25 MED ORDER — REGADENOSON 0.4 MG/5ML IV SOLN
0.4000 mg | Freq: Once | INTRAVENOUS | Status: AC
  Administered 2014-07-25: 0.4 mg via INTRAVENOUS

## 2014-07-25 NOTE — Progress Notes (Signed)
[facility] SITE 3 NUCLEAR MED [address] [place], [place] [address] [phone]    Cardiology Nuclear Med Study  Adam Gillespie is a 58 y.o. male     MRN : [id number]     DOB: 10-02-1956  Procedure Date: 07/25/2014  Nuclear Med Background Indication for Stress Test:  Evaluation for Ischemia and Surgical Clearance ICD Generator Change History:  CAD, 10/01 EF" 26% Scar Thallium Viability Study  AICD Cardiac Risk Factors: Carotid Disease and Hypertension  Symptoms:  Fatigue   Nuclear Pre-Procedure Caffeine/Decaff Intake:  None NPO After: 8:00pm   Lungs:  clear O2 Sat: 96% on room air. IV 0.9% NS with Angio Cath:  24g  IV Site: L Hand  IV Started by:  Irean Hong, RN  Chest Size (in):  40 Cup Size: n/a  Height: 5\' 4"  (1.626 m)  Weight:  147 lb (66.679 kg)  BMI:  Body mass index is 25.22 kg/(m^2). Tech Comments:  N/A    Nuclear Med Study 1 or 2 day study: 1 day  Stress Test Type:  Adenosine  Reading MD: N/A  Order Authorizing Provider:  Berton Mount, MD  Resting Radionuclide: Technetium 60m Sestamibi  Resting Radionuclide Dose: 11.0 mCi   Stress Radionuclide:  Technetium 32m Sestamibi  Stress Radionuclide Dose: 33.0 mCi           Stress Protocol Rest HR: 68 Stress HR: 95  Rest BP: 104/61 Stress BP: 107/69  Exercise Time (min): n/a METS: n/a   Predicted Max HR: 163 bpm % Max HR: 58.28 bpm Rate Pressure Product: 70263   Dose of Adenosine (mg):  n/a Dose of Lexiscan: 0.4 mg  Dose of Atropine (mg): n/a Dose of Dobutamine: n/a mcg/kg/min (at max HR)  Stress Test Technologist: Milana Na, EMT-P  Nuclear Technologist:  Harlow Asa, CNMT     Rest Procedure:  Myocardial perfusion imaging was performed at rest 45 minutes following the intravenous administration of Technetium 63m Sestamibi. Rest ECG: NSR-LBBB  Stress Procedure:  The patient received IV Lexiscan 0.4 mg over 15-seconds.  Technetium 58m Sestamibi injected at 30-seconds. This patient had  chest pain, sob, and a headache with the Lexiscan injection. Quantitative spect images were obtained after a 45 minute delay. Stress ECG: No significant change from baseline ECG  QPS Raw Data Images:  Normal; no motion artifact; normal heart/lung ratio. Stress Images:  There is a large severe defect in the anterior lateral wall.  Rest Images:  There is a large severe defect in the anterior lateral walls.  Subtraction (SDS):  No significant ischemia.  Transient Ischemic Dilatation (Normal <1.22):  1.00 Lung/Heart Ratio (Normal <0.45):  0.32  Quantitative Gated Spect Images QGS EDV:  162 ml QGS ESV:  115 ml  Impression Exercise Capacity:  Lexiscan with no exercise. BP Response:  Normal blood pressure response. Clinical Symptoms:  No significant symptoms noted. ECG Impression:  No significant ST segment change suggestive of ischemia. Comparison with Prior Nuclear Study: No images to compare,  The previous notes by [name] describe a similar myoview in July 2011.  Overall Impression:  Low risk stress nuclear study .  There is evidence of a previous anteriorlateral MI that is old.   .  LV Ejection Fraction: 29%.  LV Wall Motion:  akinesis / severe hypokinesis of the anterior lateral wall.   [name], [name]., MD, [facility] 07/25/2014, 5:50 PM [address],  [address] Office [phone] Pager [phone]

## 2014-07-30 ENCOUNTER — Telehealth: Payer: Self-pay | Admitting: Internal Medicine

## 2014-07-30 DIAGNOSIS — E785 Hyperlipidemia, unspecified: Secondary | ICD-10-CM | POA: Diagnosis not present

## 2014-07-30 DIAGNOSIS — E039 Hypothyroidism, unspecified: Secondary | ICD-10-CM | POA: Diagnosis not present

## 2014-07-30 DIAGNOSIS — Z7982 Long term (current) use of aspirin: Secondary | ICD-10-CM | POA: Diagnosis not present

## 2014-07-30 DIAGNOSIS — M109 Gout, unspecified: Secondary | ICD-10-CM | POA: Diagnosis not present

## 2014-07-30 DIAGNOSIS — Z23 Encounter for immunization: Secondary | ICD-10-CM | POA: Diagnosis not present

## 2014-07-30 DIAGNOSIS — I442 Atrioventricular block, complete: Secondary | ICD-10-CM | POA: Diagnosis not present

## 2014-07-30 DIAGNOSIS — Z951 Presence of aortocoronary bypass graft: Secondary | ICD-10-CM | POA: Diagnosis not present

## 2014-07-30 DIAGNOSIS — I252 Old myocardial infarction: Secondary | ICD-10-CM | POA: Diagnosis not present

## 2014-07-30 DIAGNOSIS — I255 Ischemic cardiomyopathy: Secondary | ICD-10-CM | POA: Diagnosis not present

## 2014-07-30 DIAGNOSIS — Z791 Long term (current) use of non-steroidal anti-inflammatories (NSAID): Secondary | ICD-10-CM | POA: Diagnosis not present

## 2014-07-30 DIAGNOSIS — Z79899 Other long term (current) drug therapy: Secondary | ICD-10-CM | POA: Diagnosis not present

## 2014-07-30 DIAGNOSIS — Z4502 Encounter for adjustment and management of automatic implantable cardiac defibrillator: Secondary | ICD-10-CM | POA: Diagnosis not present

## 2014-07-30 MED ORDER — CEFAZOLIN SODIUM-DEXTROSE 2-3 GM-% IV SOLR: 2.0000 g | INTRAVENOUS | Status: DC

## 2014-07-30 MED ORDER — SODIUM CHLORIDE 0.9 % IV SOLN
INTRAVENOUS | Status: DC
  Administered 2014-07-31: 09:00:00 via INTRAVENOUS

## 2014-07-30 MED ORDER — CHLORHEXIDINE GLUCONATE 4 % EX LIQD
60.0000 mL | Freq: Once | CUTANEOUS | Status: DC
  Filled 2014-07-30: qty 60

## 2014-07-30 MED ORDER — SODIUM CHLORIDE 0.9 % IR SOLN
80.0000 mg | Status: DC
  Filled 2014-07-30: qty 2

## 2014-07-30 MED ORDER — MUPIROCIN 2 % EX OINT
1.0000 "application " | TOPICAL_OINTMENT | Freq: Once | CUTANEOUS | Status: AC
  Administered 2014-07-31: 1 via TOPICAL
  Filled 2014-07-30: qty 22

## 2014-07-30 NOTE — Telephone Encounter (Signed)
Informed her that we could not email letter, but reviewed it with her verbally. Patient's dtr verbalized understanding.

## 2014-07-30 NOTE — Telephone Encounter (Signed)
New message    Niece  calling wants to know can you  e-mail her uncle pre-op letter prior to schedule on 10/14.    [email or web address]

## 2014-07-31 ENCOUNTER — Encounter (HOSPITAL_COMMUNITY)
Admission: RE | Disposition: A | Discharge status: Home / Self Care | Payer: Medicare HMO | Source: Ambulatory Visit | Attending: Internal Medicine

## 2014-07-31 ENCOUNTER — Encounter (HOSPITAL_COMMUNITY): Payer: Self-pay

## 2014-07-31 ENCOUNTER — Encounter (HOSPITAL_COMMUNITY): Payer: Medicare HMO

## 2014-07-31 ENCOUNTER — Ambulatory Visit (HOSPITAL_COMMUNITY)
Admission: RE | Admit: 2014-07-31 | Discharge: 2014-08-01 | Disposition: A | Discharge status: Home / Self Care | Payer: Medicare HMO | Source: Ambulatory Visit | Attending: Internal Medicine | Admitting: Internal Medicine

## 2014-07-31 DIAGNOSIS — E785 Hyperlipidemia, unspecified: Secondary | ICD-10-CM | POA: Diagnosis not present

## 2014-07-31 DIAGNOSIS — I5022 Chronic systolic (congestive) heart failure: Secondary | ICD-10-CM

## 2014-07-31 DIAGNOSIS — T82110A Breakdown (mechanical) of cardiac electrode, initial encounter: Secondary | ICD-10-CM

## 2014-07-31 DIAGNOSIS — Z791 Long term (current) use of non-steroidal anti-inflammatories (NSAID): Secondary | ICD-10-CM | POA: Insufficient documentation

## 2014-07-31 DIAGNOSIS — Z4502 Encounter for adjustment and management of automatic implantable cardiac defibrillator: Secondary | ICD-10-CM | POA: Insufficient documentation

## 2014-07-31 DIAGNOSIS — E039 Hypothyroidism, unspecified: Secondary | ICD-10-CM | POA: Insufficient documentation

## 2014-07-31 DIAGNOSIS — I454 Nonspecific intraventricular block: Secondary | ICD-10-CM

## 2014-07-31 DIAGNOSIS — I255 Ischemic cardiomyopathy: Secondary | ICD-10-CM | POA: Diagnosis present

## 2014-07-31 DIAGNOSIS — Z951 Presence of aortocoronary bypass graft: Secondary | ICD-10-CM | POA: Insufficient documentation

## 2014-07-31 DIAGNOSIS — I442 Atrioventricular block, complete: Secondary | ICD-10-CM

## 2014-07-31 DIAGNOSIS — M109 Gout, unspecified: Secondary | ICD-10-CM | POA: Insufficient documentation

## 2014-07-31 DIAGNOSIS — Z789 Other specified health status: Secondary | ICD-10-CM

## 2014-07-31 DIAGNOSIS — Z23 Encounter for immunization: Secondary | ICD-10-CM | POA: Insufficient documentation

## 2014-07-31 DIAGNOSIS — Z7982 Long term (current) use of aspirin: Secondary | ICD-10-CM | POA: Insufficient documentation

## 2014-07-31 DIAGNOSIS — Z79899 Other long term (current) drug therapy: Secondary | ICD-10-CM | POA: Insufficient documentation

## 2014-07-31 DIAGNOSIS — Z95810 Presence of automatic (implantable) cardiac defibrillator: Secondary | ICD-10-CM

## 2014-07-31 DIAGNOSIS — I252 Old myocardial infarction: Secondary | ICD-10-CM | POA: Insufficient documentation

## 2014-07-31 HISTORY — PX: LEAD REVISION: SHX5945

## 2014-07-31 HISTORY — DX: Cardiac murmur, unspecified: R01.1

## 2014-07-31 HISTORY — DX: Heart failure, unspecified: I50.9

## 2014-07-31 HISTORY — PX: IMPLANTABLE CARDIOVERTER DEFIBRILLATOR (ICD) GENERATOR CHANGE: SHX5469

## 2014-07-31 HISTORY — DX: Atherosclerotic heart disease of native coronary artery without angina pectoris: I25.10

## 2014-07-31 LAB — SURGICAL PCR SCREEN
MRSA, PCR: NEGATIVE
STAPHYLOCOCCUS AUREUS: NEGATIVE

## 2014-07-31 LAB — VITAMIN B12: Vitamin B-12: 523 pg/mL (ref 211–911)

## 2014-07-31 LAB — FOLATE RBC: RBC Folate: 657 ng/mL — ABNORMAL HIGH (ref >280)

## 2014-07-31 SURGERY — ICD GENERATOR CHANGE
Anesthesia: LOCAL

## 2014-07-31 MED ORDER — LIDOCAINE HCL (PF) 1 % IJ SOLN
INTRAMUSCULAR | Status: AC
  Filled 2014-07-31: qty 30

## 2014-07-31 MED ORDER — LEVOTHYROXINE SODIUM 125 MCG PO TABS
125.0000 ug | ORAL_TABLET | Freq: Every day | ORAL | Status: DC
  Administered 2014-07-31 – 2014-08-01 (×2): 125 ug via ORAL
  Filled 2014-07-31 (×3): qty 1

## 2014-07-31 MED ORDER — FENTANYL CITRATE 0.05 MG/ML IJ SOLN
INTRAMUSCULAR | Status: AC
  Filled 2014-07-31: qty 2

## 2014-07-31 MED ORDER — ALLOPURINOL 100 MG PO TABS
100.0000 mg | ORAL_TABLET | Freq: Every day | ORAL | Status: DC
  Administered 2014-07-31 – 2014-08-01 (×2): 100 mg via ORAL
  Filled 2014-07-31 (×2): qty 1

## 2014-07-31 MED ORDER — CEFAZOLIN SODIUM-DEXTROSE 2-3 GM-% IV SOLR
INTRAVENOUS | Status: AC
  Filled 2014-07-31: qty 50

## 2014-07-31 MED ORDER — YOU HAVE A PACEMAKER BOOK
Freq: Once | Status: AC
  Administered 2014-07-31: 22:00:00
  Filled 2014-07-31: qty 1

## 2014-07-31 MED ORDER — ONDANSETRON HCL 4 MG/2ML IJ SOLN: 4.0000 mg | Freq: Four times a day (QID) | INTRAMUSCULAR | Status: DC | PRN

## 2014-07-31 MED ORDER — MIDAZOLAM HCL 5 MG/5ML IJ SOLN
INTRAMUSCULAR | Status: AC
  Filled 2014-07-31: qty 5

## 2014-07-31 MED ORDER — MUPIROCIN 2 % EX OINT
TOPICAL_OINTMENT | Freq: Once | CUTANEOUS | Status: DC
  Filled 2014-07-31: qty 22

## 2014-07-31 MED ORDER — ACETAMINOPHEN 325 MG PO TABS
325.0000 mg | ORAL_TABLET | ORAL | Status: DC | PRN
  Administered 2014-07-31: 650 mg via ORAL
  Filled 2014-07-31: qty 2

## 2014-07-31 MED ORDER — ASPIRIN 81 MG PO CHEW
81.0000 mg | CHEWABLE_TABLET | Freq: Every day | ORAL | Status: DC
  Administered 2014-07-31 – 2014-08-01 (×2): 81 mg via ORAL
  Filled 2014-07-31 (×2): qty 1

## 2014-07-31 MED ORDER — SODIUM CHLORIDE 0.9 % IV SOLN: INTRAVENOUS | Status: AC

## 2014-07-31 MED ORDER — INFLUENZA VAC SPLIT QUAD 0.5 ML IM SUSY
0.5000 mL | PREFILLED_SYRINGE | INTRAMUSCULAR | Status: AC
  Administered 2014-08-01: 0.5 mL via INTRAMUSCULAR
  Filled 2014-07-31: qty 0.5

## 2014-07-31 MED ORDER — OXYBUTYNIN CHLORIDE ER 10 MG PO TB24
10.0000 mg | ORAL_TABLET | Freq: Every day | ORAL | Status: DC
  Administered 2014-07-31 – 2014-08-01 (×2): 10 mg via ORAL
  Filled 2014-07-31 (×2): qty 1

## 2014-07-31 MED ORDER — HYDRALAZINE HCL 25 MG PO TABS
12.5000 mg | ORAL_TABLET | Freq: Two times a day (BID) | ORAL | Status: DC
  Administered 2014-07-31 – 2014-08-01 (×3): 12.5 mg via ORAL
  Filled 2014-07-31 (×4): qty 0.5

## 2014-07-31 MED ORDER — CEFAZOLIN SODIUM 1-5 GM-% IV SOLN
1.0000 g | Freq: Four times a day (QID) | INTRAVENOUS | Status: AC
  Administered 2014-07-31 – 2014-08-01 (×3): 1 g via INTRAVENOUS
  Filled 2014-07-31 (×3): qty 50

## 2014-07-31 MED ORDER — METOPROLOL SUCCINATE ER 50 MG PO TB24
50.0000 mg | ORAL_TABLET | Freq: Every day | ORAL | Status: DC
  Administered 2014-07-31 – 2014-08-01 (×2): 50 mg via ORAL
  Filled 2014-07-31 (×2): qty 1

## 2014-07-31 MED ORDER — SIMVASTATIN 40 MG PO TABS
40.0000 mg | ORAL_TABLET | Freq: Every day | ORAL | Status: DC
  Administered 2014-07-31: 22:00:00 40 mg via ORAL
  Filled 2014-07-31 (×2): qty 1

## 2014-07-31 MED ORDER — HEPARIN (PORCINE) IN NACL 2-0.9 UNIT/ML-% IJ SOLN
INTRAMUSCULAR | Status: AC
  Filled 2014-07-31: qty 500

## 2014-07-31 MED ORDER — FUROSEMIDE 20 MG PO TABS
20.0000 mg | ORAL_TABLET | ORAL | Status: DC
  Filled 2014-07-31 (×2): qty 1

## 2014-07-31 MED ORDER — MUPIROCIN 2 % EX OINT
TOPICAL_OINTMENT | CUTANEOUS | Status: AC
  Administered 2014-07-31: 1 via TOPICAL
  Filled 2014-07-31: qty 22

## 2014-07-31 MED ORDER — ISOSORBIDE MONONITRATE ER 30 MG PO TB24
30.0000 mg | ORAL_TABLET | Freq: Every day | ORAL | Status: DC
  Administered 2014-07-31 – 2014-08-01 (×2): 30 mg via ORAL
  Filled 2014-07-31 (×2): qty 1

## 2014-07-31 NOTE — CV Procedure (Signed)
Adam Gillespie [id number]  [id number]  Preop Dx: previous implant with lead on advisory recall Postop Dx same/    Procedure: contrast venogram single chamber ICD implantation with out intraoperative defibrillation threshold testing  Following the obtaining of informed consent the patient was brought to the electrophysiology laboratory in place of the fluoroscopic table in the supine position.  Venography demonstrated patent [email or web address]   After routine prep and drape, lidocaine was infiltrated in the prepectoral subclavicular region and an incision was made and carried down to the layer of the prepectoral fascia using electrocautery and sharp dissection.m Plasma blade was used   Thereafter an attention was turned to gain access to the extrathoracic left subclavian vein which was accomplished without  difficulty and without the aspiration of air or puncture of the artery. A 9 French sheath was placed for which was then passed a  single coil  active fixation defibrillator lead, model Medtronic [id number] serial number [id number]. It was  passed under fluoroscopic guidance to the right ventricular apex. In its location the bipolar R wave was 4.6-5 millivolts, impedance was 784 ohms, the pacing threshold was 0.6 volts at 0.5 msec. Current at threshold was 1.2 mA.  There was no diaphragmatic pacing at 10 V. The current of injury was brisk .  The lead was secured to the prepectoral fascia and then attached to a Medtronic EVERA ICD, serial number  [id number].  Through the device, the bipolar R wave was 5.8 millivolts, impedance was 608 ohms, the pacing threshold was 0.5 volts at 0.4 msec. High-voltage impedance was 87 ohms.  The pocket was copiously irrigated with antibiotic containing saline solution. Hemostasis was assured, and the device and the lead was placed in the pocket and secured to the prepectoral fascia.  The wound was closed in 2  layers in normal fashion. The wound was washed dried and a DERMABOND   was then applied. Needle counts sponge counts and instrument counts were correct at the end of the procedure according to the staff.    Patient tolerated the procedure without apparent complication  EBL  Minimal     Cx: None     Sherryl Manges, MD 07/31/2014 11:44 AM

## 2014-07-31 NOTE — Discharge Summary (Signed)
Physician Discharge Summary  Patient ID: Adam Gillespie MRN: [id number] DOB/AGE: 11-20-1955 58 y.o.  Admit date: 07/31/2014 Discharge date: 08/01/2014  [facility]  Discharge Diagnoses:  Active Problems:   Ischemic cardiomyopathy   Implantable cardioverter-defibrillator lead failure   Automatic implantable cardioverter-defibrillator in situ   Complete heart block-intermittent   IVCD (intraventricular conduction defect)   Discharged Condition: stable  Hospital Course: The patient is a 58 y/o male with a history of CABG x 5 in 2001 and ischemic cardiomyopathy necessitating ICD implantation in 2006. Records indicate nuclear study in 04/2010 demonstrated old anterior infarct, EF 26%. Echocardiogram 04/2012: EF 25%, inferior, inferoseptal, distal anterior and apical HK, mild AI, mild MR.   He was seen recently in clinic and interrogation of his device revealed that his generator had reached ERI. [facility] undergo a generator change. The procedure was performed by Dr. Graciela HusbandsKlein. He underwent successful implantation of a new Medtronic EVER ICD, serial number [id number]. He tolerated the procedure well and left the OR in stable condition. Post-operative device interrogation revealed normal device function. There were no post-operative complications. He was last seen and examined by Dr. Graciela HusbandsKlein who determined he was stable for discharge home.   Consults: None  Significant Studies  Treatments:   ICD Generator Change-Out:   Device: Medtronic [id number] serial number [id number]    Discharge Exam: Blood pressure 117/66, pulse 69, temperature 98.3 F (36.8 C), temperature source Oral, resp. rate 20, height 5\' 4"  (1.626 m), weight 147 lb 11.3 oz (67 kg), SpO2 97.00%.   Disposition: 01-Home or Self Care     Follow-up Information   [facility] On 08/14/2014. (10:30 AM)    Contact information:   [address] [place] [phone]      Follow up with Sherryl MangesSteven Klein, MD In 3 months. (we'll contact you.)    Specialty:  Cardiology   Contact information:   [address] [place] [id number] [phone]         Medication List         allopurinol 100 MG tablet  Commonly known as:  ZYLOPRIM  Take 1 tablet (100 mg total) by mouth daily.     aspirin 81 MG tablet  Take 81 mg by mouth daily.     furosemide 20 MG tablet  Commonly known as:  LASIX  Take 20 mg by mouth 3 (three) times a week. Tuesday, Thursday, Saturday     hydrALAZINE 25 MG tablet  Commonly known as:  APRESOLINE  Take 12.5 mg by mouth 2 (two) times daily.     isosorbide mononitrate 30 MG 24 hr tablet  Commonly known as:  IMDUR  Take 1 tablet (30 mg total) by mouth daily.     levothyroxine 125 MCG tablet  Commonly known as:  SYNTHROID, LEVOTHROID  Take 1 tablet (125 mcg total) by mouth daily.     metoprolol succinate 50 MG 24 hr tablet  Commonly known as:  TOPROL-XL  Take 1 tablet (50 mg total) by mouth daily.     omeprazole 20 MG tablet  Commonly known as:  PRILOSEC OTC  Take 40 mg by mouth daily.     oxybutynin 10 MG 24 hr tablet  Commonly known as:  DITROPAN-XL  Take 10 mg by mouth daily.     simvastatin 40 MG tablet  Commonly known as:  ZOCOR  Take 1 tablet (40  mg total) by mouth at bedtime.       Follow-up Information   Follow up with [facility] On 08/14/2014. (10:30 AM)    Contact information:   [address] [place] [phone]      Follow up with Sherryl Manges, MD In 3 months. (we'll contact you.)    Specialty:  Cardiology   Contact information:   [address] [place] [address] [phone]     TIME SPENT ON DISCHARGE, INCLUDING PHYSICIAN TIME: >30 MINUTES  Signed: Nicolasa Ducking, NP 08/01/2014, 8:49 AM

## 2014-07-31 NOTE — Care Management Note (Addendum)
  Page 1 of 1   07/31/2014     1:12:23 PM CARE MANAGEMENT NOTE 07/31/2014  Patient:  ALEXYS, ORRIS   Account Number:  [id number]  Date Initiated:  07/31/2014  Documentation initiated by:  Donato Schultz  Subjective/Objective Assessment:   Ischemic Cardiomyopathy     Action/Plan:   CM to follow for disposition needs   Anticipated DC Date:  08/01/2014   Anticipated DC Plan:  HOME/SELF CARE         Choice offered to / List presented to:             Status of service:  Completed, signed off Medicare Important Message given?   (If response is "NO", the following Medicare IM given date fields will be blank) Date Medicare IM given:   Medicare IM given by:   Date Additional Medicare IM given:   Additional Medicare IM given by:    Discharge Disposition:  HOME/SELF CARE  Per UR Regulation:    If discussed at Long Length of Stay Meetings, dates discussed:    Comments:  Carine Nordgren RN, BSN, MSHL, CCM  Nurse - Case Manager,  (Unit [phone]  07/31/2014 Med review:  no needs identified.

## 2014-07-31 NOTE — Interval H&P Note (Signed)
History and Physical Interval Note:  07/31/2014 ICD Criteria  Current LVEF:25% ;Obtained < 1 month ago.  NYHA Functional Classification: Class II  Heart Failure History:  Yes, Duration of heart failure since onset is > 9 months  Non-Ischemic Dilated Cardiomyopathy History:  No.  Atrial Fibrillation/Atrial Flutter:  No.  Ventricular Tachycardia History:  No.  Cardiac Arrest History:  No  History of Syndromes with Risk of Sudden Death:  No.  Previous ICD:  Yes, ICD Type:  Single, Reason for ICD:  Primary prevention.  LVEF is not available  Electrophysiology Study: No.  Prior MI: Yes, Most recent MI timeframe is > 40 days.  PPM: No.  OSA:  No  Patient Life Expectancy of >=1 year: Yes.  Anticoagulation Therapy:  Patient is NOT on anticoagulation therapy.   Beta Blocker Therapy:  Yes.   Ace Inhibitor/ARB Therapy:  No, Reason not on Ace Inhibitor/ARB therapy:  allergy  on hydralazine nitrates istead 8:16 AM  [name]  has presented today for surgery, with the diagnosis of eri  The various methods of treatment have been discussed with the patient and family. After consideration of risks, benefits and other options for treatment, the patient has consented to  Procedure(s): ICD GENERATOR CHANGE (N/A) LEAD REVISION (N/A) as a surgical intervention .  The patient's history has been reviewed, patient examined, no change in status, stable for surgery.  I have reviewed the patient's chart and labs.  Questions were answered to the patient's satisfaction.     Sherryl Manges

## 2014-07-31 NOTE — H&P (View-Only) (Signed)
kf Patient Care Team: Lonie Peak, PA-C as PCP - General (Physician Assistant)   HPI  Adam Gillespie is a 58 y.o. male Seen in followup for an ICD implanted 2006 the setting of ischemic heart disease and prior bypass surgery x5 in 2001  Device at [facility]  He also has 6949 lead   Records indicate nuclear study in 04/2010 demonstrated old anterior infarct, EF 26%. Echocardiogram 04/2012: EF 25%, inferior, inferoseptal, distal anterior and apical HK, mild AI, mild MR.   The patient denies chest pain, shortness of breath, nocturnal dyspnea, orthopnea or peripheral edema.  There have been no palpitations, lightheadedness or syncope  His sleep disordered breathing and is falling asleep on me as we talked today.          Past Medical History  Diagnosis Date  . Anoxic encephalopathy     POSTOP FROM 2001  . LV dysfunction     EF is 25% per echo in July 2013  . Carotid stenosis, right     60-79% right, 40 - 59% left - needs dopplers in June 2014  . Hyperlipidemia   . Gout   . Hypothyroidism   . Ischemic cardiomyopathy     Remote MI with CABG x 5 in 2001; Myocardial perfusion imaging EF 26% old infarct anterior wall. 04/2010  . 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 Hearing deficit     Bilateral ears  . 9774 lead 05/01/2014  . Automatic implantable cardiac defibrillator -Medtronic 04/23/2011    Past Surgical History  Procedure Laterality Date  . Cardiac catheterization  08/05/2000    THERE IS ANTERIOR AND APICAL AKINESIS. EF IS ESTIMATED 20%, WITH INFEROBASILAR PORTIONS CONTRACTING REASONABLY WELL.  Marland Kitchen Coronary artery bypass graft      x5. lima to the lad, saphenous vein graft to the intermediate and obtuse mariginal, and a saphenous vein graft to the acute mariginal and distal right coronary artery  . Cardiac defibrillator placement    . Colonoscopy with propofol N/A 01/09/2013    Procedure: COLONOSCOPY WITH PROPOFOL;  Surgeon: Shirley Friar, MD;  Location: [facility] ENDOSCOPY;   Service: Endoscopy;  Laterality: N/A;  . Esophagogastroduodenoscopy N/A 07/11/2013    Procedure: ESOPHAGOGASTRODUODENOSCOPY (EGD);  Surgeon: Shirley Friar, MD;  Location: [facility] ENDOSCOPY;  Service: Endoscopy;  Laterality: N/A;  . Savory dilation N/A 07/11/2013    Procedure: SAVORY DILATION;  Surgeon: Shirley Friar, MD;  Location: [facility] ENDOSCOPY;  Service: Endoscopy;  Laterality: N/A;  . Balloon dilation N/A 07/11/2013    Procedure: BALLOON DILATION;  Surgeon: Shirley Friar, MD;  Location: [facility] ENDOSCOPY;  Service: Endoscopy;  Laterality: N/A;    Current Outpatient Prescriptions  Medication Sig Dispense Refill  . allopurinol (ZYLOPRIM) 100 MG tablet Take 1 tablet (100 mg total) by mouth daily.  30 tablet  11  . aspirin 81 MG tablet Take 81 mg by mouth daily.        . furosemide (LASIX) 20 MG tablet TAKE 20 MG ONLY ON MON, AND THURSDAY'S  30 tablet  11  . hydrALAZINE (APRESOLINE) 25 MG tablet TAKE ONE-HALF (1/2) TABLET TWICE A DAY  30 tablet  11  . isosorbide mononitrate (IMDUR) 30 MG 24 hr tablet Take 1 tablet (30 mg total) by mouth daily.  30 tablet  11  . levothyroxine (SYNTHROID, LEVOTHROID) 125 MCG tablet Take 1 tablet (125 mcg total) by mouth daily.  30 tablet  11  . meloxicam (MOBIC) 7.5 MG tablet Take 7.5 mg by mouth. As  needed for gout flare up      . metoprolol succinate (TOPROL-XL) 50 MG 24 hr tablet Take 1 tablet (50 mg total) by mouth daily.  30 tablet  11  . omeprazole (PRILOSEC OTC) 20 MG tablet Take 40 mg by mouth daily.       . OXYBUTYNIN CHLORIDE ER PO Take 10 mg by mouth daily.      . simvastatin (ZOCOR) 40 MG tablet Take 1 tablet (40 mg total) by mouth at bedtime.  30 tablet  11   No current facility-administered medications for this visit.    Allergies  Allergen Reactions  . Ace Inhibitors     Contributed to hyperkalemia and renal insufficiency in the past (see office note 10/03/2012)    Review of Systems negative except from HPI and PMH  Physical Exam BP  110/62  Pulse 70  Ht 5\' 4"  (1.626 m)  Wt 149 lb (67.586 kg)  BMI 25.56 kg/m2 Well developed and well nourished in no acute distress HENT normal E scleral and icterus clear Neck Supple Clear to ausculation anteriorly and posteriorly Device pocket well healed; without hematoma or erythema.  There is no tethering Regular rate and rhythm, no murmurs gallops or rub Soft with active bowel sounds No clubbing cyanosis none Edema Alert and oriented, grossly normal motor and sensory function Skin Warm and Dry  b  Assessment and  Plan  Ischemic cardiomyopathy  Sleep-disordered breathing/daytime fatigue  Ace Intolerance--  On hydralazine nitrates  implantable defibrillator-Medtronic   6949-lead   We have reviewed the benefits and risks of generator replacement.  These include but are not limited to lead fracture and infection.  The patient understands, agrees and is willing to proceed.    He'll also need replacement of his 6949-lead.  He has significantly sleep disruption was snoring and daytime fatigue. We'll undertake a sleep study, preferably at home.  He needs a Myoview scan prior to device implantation for risk stratification.

## 2014-08-01 ENCOUNTER — Ambulatory Visit (HOSPITAL_COMMUNITY): Payer: Medicare HMO

## 2014-08-01 ENCOUNTER — Encounter (HOSPITAL_COMMUNITY): Payer: Self-pay | Admitting: Nurse Practitioner

## 2014-08-01 DIAGNOSIS — I454 Nonspecific intraventricular block: Secondary | ICD-10-CM

## 2014-08-01 DIAGNOSIS — T82110A Breakdown (mechanical) of cardiac electrode, initial encounter: Secondary | ICD-10-CM

## 2014-08-01 DIAGNOSIS — I442 Atrioventricular block, complete: Secondary | ICD-10-CM

## 2014-08-01 DIAGNOSIS — Z95810 Presence of automatic (implantable) cardiac defibrillator: Secondary | ICD-10-CM

## 2014-08-01 DIAGNOSIS — Z4502 Encounter for adjustment and management of automatic implantable cardiac defibrillator: Secondary | ICD-10-CM | POA: Diagnosis not present

## 2014-08-01 DIAGNOSIS — I255 Ischemic cardiomyopathy: Secondary | ICD-10-CM | POA: Diagnosis not present

## 2014-08-01 DIAGNOSIS — E785 Hyperlipidemia, unspecified: Secondary | ICD-10-CM | POA: Diagnosis not present

## 2014-08-01 NOTE — Discharge Instructions (Signed)
**  PLEASE REMEMBER TO BRING ALL OF YOUR MEDICATIONS TO EACH OF YOUR FOLLOW-UP OFFICE VISITS. ° ° ° ° °Supplemental Discharge Instructions for  °Pacemaker/Defibrillator Patients ° °Activity °No heavy lifting or vigorous activity with your left/right arm for 6 to 8 weeks.  Do not raise your left/right arm above your head for one week.  Gradually raise your affected arm as drawn below. ° °        ° °__         10/19               /           10/20           /           10/21          /          10/22           ° °NO DRIVING for     ; you may begin driving on  10/22   . ° °WOUND CARE °  Keep the wound area clean and dry.  Do not get this area wet for one week. No showers for one week; you may shower on  10/22   . °  The tape/steri-strips on your wound will fall off; do not pull them off.  No bandage is needed on the site.  DO  NOT apply any creams, oils, or ointments to the wound area. °  If you notice any drainage or discharge from the wound, any swelling or bruising at the site, or you develop a fever > 101? F after you are discharged home, call the office at once. ° °Special Instructions °  You are still able to use cellular telephones; use the ear opposite the side where you have your pacemaker/defibrillator.  Avoid carrying your cellular phone near your device. °  When traveling through airports, show security personnel your identification card to avoid being screened in the metal detectors.  Ask the security personnel to use the hand wand. °  Avoid arc welding equipment, MRI testing (magnetic resonance imaging), TENS units (transcutaneous nerve stimulators).  Call the office for questions about other devices. °  Avoid electrical appliances that are in poor condition or are not properly grounded. °  Microwave ovens are safe to be near or to operate. ° °Additional information for defibrillator patients should your device go off: °  If your device goes off ONCE and you feel fine afterward, notify the device clinic  nurses. °  If your device goes off ONCE and you do not feel well afterward, call 911. °  If your device goes off TWICE, call 911. °  If your device goes off THREE times in one day, call 911. ° °DO NOT DRIVE YOURSELF OR A FAMILY MEMBER °WITH A DEFIBRILLATOR TO THE HOSPITAL--CALL 911. ° °

## 2014-08-01 NOTE — Progress Notes (Signed)
   ELECTROPHYSIOLOGY ROUNDING NOTE    Patient Name: Adam Gillespie Date of Encounter: 08/01/2014    SUBJECTIVE:Patient feels well.  No chest pain or shortness of breath.    S/p ICD gen change and RV lead revision yesterday.   TELEMETRY: Reviewed telemetry pt in sinus rhythm with intermittent complete heart block with ventricular pacing Filed Vitals:   07/31/14 2143 07/31/14 2317 08/01/14 0035 08/01/14 0400  BP: 138/60 92/43  117/66  Pulse:  66  69  Temp:  98.1 F (36.7 C)  98.3 F (36.8 C)  TempSrc:  Oral  Oral  Resp:  18  20  Height:      Weight:   147 lb 11.3 oz (67 kg)   SpO2:  94%  97%    Intake/Output Summary (Last 24 hours) at 08/01/14 0717 Last data filed at 08/01/14 0346  Gross per 24 hour  Intake    780 ml  Output    600 ml  Net    180 ml    CURRENT MEDICATIONS: . allopurinol  100 mg Oral Daily  . aspirin  81 mg Oral Daily  . furosemide  20 mg Oral Once per day on Mon Wed Fri  . hydrALAZINE  12.5 mg Oral BID  . Influenza vac split quadrivalent PF  0.5 mL Intramuscular Tomorrow-1000  . isosorbide mononitrate  30 mg Oral Daily  . levothyroxine  125 mcg Oral QAC breakfast  . metoprolol succinate  50 mg Oral Daily  . oxybutynin  10 mg Oral Daily  . simvastatin  40 mg Oral QHS    Radiology/Studies:  Final result pending, leads in stable position.  PHYSICAL EXAM Well developed and nourished in no acute distress HENT normal Neck supple with JVP-flat Clear Regular rate and rhythm, no murmurs or gallops Abd-soft with active BS No Clubbing cyanosis edema Skin-warm and dry A & Oriented  Grossly normal sensory and motor function  ECG  IVCD with m,ostly RBBB characteristics  DEVICE INTERROGATION: Device interrogation pending  Active Problems:   Ischemic cardiomyopathy   Implantable cardioverter-defibrillator lead failure   Automatic implantable cardioverter-defibrillator in situ   Complete heart block-intermittent   IVCD (intraventricular  conduction defect)    Wound care, restrictions reviewed with patient.  Routine follow up scheduled  Intermittent heart block during lead manipulation most consistent with funcitonal LBBB  Some heart block yesteday afternoon but none now  Will follow and idscharge on current meds

## 2014-08-14 ENCOUNTER — Encounter: Payer: Self-pay | Admitting: Internal Medicine

## 2014-08-14 ENCOUNTER — Telehealth: Payer: Self-pay | Admitting: Cardiology

## 2014-08-14 ENCOUNTER — Ambulatory Visit (INDEPENDENT_AMBULATORY_CARE_PROVIDER_SITE_OTHER): Payer: Medicare HMO | Admitting: *Deleted

## 2014-08-14 DIAGNOSIS — Z95810 Presence of automatic (implantable) cardiac defibrillator: Secondary | ICD-10-CM

## 2014-08-14 DIAGNOSIS — I255 Ischemic cardiomyopathy: Secondary | ICD-10-CM

## 2014-08-14 DIAGNOSIS — I5022 Chronic systolic (congestive) heart failure: Secondary | ICD-10-CM

## 2014-08-14 LAB — MDC_IDC_ENUM_SESS_TYPE_INCLINIC
Battery Remaining Longevity: 138 mo
Date Time Interrogation Session: 20151028140256
HIGH POWER IMPEDANCE MEASURED VALUE: 209 Ohm
HIGH POWER IMPEDANCE MEASURED VALUE: 58 Ohm
Lead Channel Impedance Value: 399 Ohm
Lead Channel Pacing Threshold Amplitude: 0.875 V
Lead Channel Pacing Threshold Pulse Width: 0.4 ms
Lead Channel Sensing Intrinsic Amplitude: 5.375 mV
Lead Channel Setting Pacing Amplitude: 3.5 V
MDC IDC MSMT BATTERY VOLTAGE: 3.16 V
MDC IDC MSMT LEADCHNL RV SENSING INTR AMPL: 5.375 mV
MDC IDC SET LEADCHNL RV PACING PULSEWIDTH: 0.4 ms
MDC IDC SET LEADCHNL RV SENSING SENSITIVITY: 0.3 mV
MDC IDC SET ZONE DETECTION INTERVAL: 320 ms
MDC IDC SET ZONE DETECTION INTERVAL: 350 ms
MDC IDC SET ZONE DETECTION INTERVAL: 450 ms
MDC IDC STAT BRADY RV PERCENT PACED: 0 %

## 2014-08-14 NOTE — Telephone Encounter (Signed)
Pt daughter with pt home monitor serial number so we can call and have site updated. SN for monitor is [id number]. Called [employer] tech services and had them add SN to pt profile.

## 2014-08-14 NOTE — Progress Notes (Signed)
Wound check appointment for changeout & revision. No steri-strips, dermabond used. Wound without redness or edema. Incision edges approximated, wound well healed. Normal device function. Threshold, sensing, and impedances consistent with implant measurements. Device programmed at 3.5V for extra safety margin until 3 month visit. Histogram distribution appropriate for patient and level of activity. No ventricular arrhythmias noted. Patient educated about wound care, arm mobility, lifting restrictions, shock plan. ROV in w/ [name] 11/15/14.

## 2014-08-26 ENCOUNTER — Other Ambulatory Visit: Payer: Self-pay | Admitting: Cardiology

## 2014-09-06 ENCOUNTER — Telehealth: Payer: Self-pay | Admitting: *Deleted

## 2014-09-06 ENCOUNTER — Encounter: Payer: Self-pay | Admitting: Internal Medicine

## 2014-09-06 DIAGNOSIS — R400 Somnolence: Secondary | ICD-10-CM

## 2014-09-06 NOTE — Telephone Encounter (Signed)
Spoke with patient's niece, Elnita Maxwell, who is his POA (document on file). Explained that home sleep study results showed AHI number at 66.7 per hour. Advised someone would be contacting them for pulmonary consult. She verbalized understanding and isn't surprised at the test findings.

## 2014-09-06 NOTE — Addendum Note (Signed)
Addended by: Baird Lyons on: 09/06/2014 01:37 PM   Modules accepted: Orders

## 2014-09-26 ENCOUNTER — Encounter (HOSPITAL_COMMUNITY): Payer: Self-pay | Admitting: Internal Medicine

## 2014-10-02 ENCOUNTER — Institutional Professional Consult (permissible substitution): Payer: Medicare HMO | Admitting: Pulmonary Disease

## 2014-11-13 ENCOUNTER — Encounter: Payer: Self-pay | Admitting: Pulmonary Disease

## 2014-11-13 ENCOUNTER — Ambulatory Visit (INDEPENDENT_AMBULATORY_CARE_PROVIDER_SITE_OTHER): Payer: Medicare Other | Admitting: Pulmonary Disease

## 2014-11-13 ENCOUNTER — Encounter (INDEPENDENT_AMBULATORY_CARE_PROVIDER_SITE_OTHER): Payer: Self-pay

## 2014-11-13 VITALS — BP 122/64 | HR 80 | Temp 97.5°F | Ht 64.0 in | Wt 149.2 lb

## 2014-11-13 DIAGNOSIS — G4731 Primary central sleep apnea: Secondary | ICD-10-CM

## 2014-11-13 DIAGNOSIS — G4733 Obstructive sleep apnea (adult) (pediatric): Secondary | ICD-10-CM | POA: Diagnosis not present

## 2014-11-13 DIAGNOSIS — G4737 Central sleep apnea in conditions classified elsewhere: Secondary | ICD-10-CM

## 2014-11-13 NOTE — Progress Notes (Signed)
   Subjective:    Patient ID: Adam Gillespie, male    DOB: 11/07/1955, 59 y.o.   MRN: [id number]  HPI The patient is a 59 year old male who I've been asked to see for management of complex sleep apnea. He has a known history of a cardiomyopathy, and had a recent home study over 2 nights which showed an AHI of 48-51 events per hour, many of which were central in nature. The patient has been noted to have loud snoring, as well as witnessed apneas during sleep. He is not rested in the mornings upon arising, and has significant sleepiness during the day with inactivity. The patient states that his weight is neutral over the last 2 years, and his Epworth score today is abnormal at 13.   Review of Systems  Constitutional: Negative for fever and unexpected weight change.  HENT: Negative for congestion, dental problem, ear pain, nosebleeds, postnasal drip, rhinorrhea, sinus pressure, sneezing, sore throat and trouble swallowing.   Eyes: Negative for redness and itching.  Respiratory: Negative for cough, chest tightness, shortness of breath and wheezing.   Cardiovascular: Negative for palpitations and leg swelling.  Gastrointestinal: Negative for nausea and vomiting.  Genitourinary: Negative for dysuria.  Musculoskeletal: Negative for joint swelling.  Skin: Negative for rash.  Neurological: Negative for headaches.  Hematological: Does not bruise/bleed easily.  Psychiatric/Behavioral: Negative for dysphoric mood. The patient is not nervous/anxious.        Objective:   Physical Exam Constitutional:  Well developed, no acute distress  HENT:  Nares patent without discharge  Oropharynx without exudate, palate and uvula are elongated  Eyes:  Perrla, eomi, no scleral icterus  Neck:  No JVD, no TMG  Cardiovascular:  Normal rate, regular rhythm, no rubs or gallops.  No murmurs        Intact distal pulses  Pulmonary :  Normal breath sounds, no stridor or respiratory distress   No rales, rhonchi,  or wheezing  Abdominal:  Soft, nondistended, bowel sounds present.  No tenderness noted.   Musculoskeletal:  mild lower extremity edema noted.  Lymph Nodes:  No cervical lymphadenopathy noted  Skin:  No cyanosis noted  Neurologic:  Alert, appropriate, moves all 4 extremities without obvious deficit.         Assessment & Plan:

## 2014-11-13 NOTE — Patient Instructions (Signed)
Will schedule for sleep study to start on a pressure device Work on modest weight loss.  Will arrange followup with me once the results are available.

## 2014-11-13 NOTE — Assessment & Plan Note (Signed)
The patient has very severe complex sleep apnea, and would greatly benefit from a positive pressure device for treatment. The patient is very difficult to communicate with, although his niece does a very good job. The patient is unsure whether he is able to wear a pressure device, but is willing to try. Given the fact that he has complex apnea, he will need to go to the sleep Center for a formal titration. I suspect C Pap will not be adequate, and will need to be on a bilevel device. I would usually prescribe ASV for a patient such as this, but he has a known cardiomyopathy and chronic congestive heart failure making it contraindicated at this point in time.

## 2014-11-15 ENCOUNTER — Encounter: Payer: Self-pay | Admitting: Internal Medicine

## 2014-11-15 ENCOUNTER — Ambulatory Visit (INDEPENDENT_AMBULATORY_CARE_PROVIDER_SITE_OTHER): Payer: Medicare Other | Admitting: Internal Medicine

## 2014-11-15 VITALS — BP 122/68 | HR 71 | Ht 64.0 in | Wt 148.4 lb

## 2014-11-15 DIAGNOSIS — I5022 Chronic systolic (congestive) heart failure: Secondary | ICD-10-CM | POA: Diagnosis not present

## 2014-11-15 DIAGNOSIS — I255 Ischemic cardiomyopathy: Secondary | ICD-10-CM | POA: Diagnosis not present

## 2014-11-15 DIAGNOSIS — I442 Atrioventricular block, complete: Secondary | ICD-10-CM | POA: Diagnosis not present

## 2014-11-15 DIAGNOSIS — Z4502 Encounter for adjustment and management of automatic implantable cardiac defibrillator: Secondary | ICD-10-CM

## 2014-11-15 LAB — MDC_IDC_ENUM_SESS_TYPE_INCLINIC
Battery Voltage: 3.15 V
Brady Statistic RV Percent Paced: 0.01 %
HighPow Impedance: 209 Ohm
HighPow Impedance: 67 Ohm
Lead Channel Impedance Value: 437 Ohm
Lead Channel Pacing Threshold Amplitude: 0.875 V
Lead Channel Sensing Intrinsic Amplitude: 4.375 mV
Lead Channel Setting Sensing Sensitivity: 0.3 mV
MDC IDC MSMT BATTERY REMAINING LONGEVITY: 137 mo
MDC IDC MSMT LEADCHNL RV PACING THRESHOLD PULSEWIDTH: 0.4 ms
MDC IDC SESS DTM: 20160129104816
MDC IDC SET LEADCHNL RV PACING AMPLITUDE: 2.5 V
MDC IDC SET LEADCHNL RV PACING PULSEWIDTH: 0.4 ms
MDC IDC SET ZONE DETECTION INTERVAL: 350 ms
MDC IDC SET ZONE DETECTION INTERVAL: 450 ms
Zone Setting Detection Interval: 320 ms

## 2014-11-15 NOTE — Patient Instructions (Signed)
Your physician recommends that you continue on your current medications as directed. Please refer to the Current Medication list given to you today.  Remote monitoring is used to monitor your Pacemaker of ICD from home. This monitoring reduces the number of office visits required to check your device to one time per year. It allows [place] to keep an eye on the functioning of your device to ensure it is working properly. You are scheduled for a device check from home on 02/17/15. You may send your transmission at any time that day. If you have a wireless device, the transmission will be sent automatically. After your physician reviews your transmission, you will receive a postcard with your next transmission date.  Your physician wants you to follow-up in: 9 months with [name].  You will receive a reminder letter in the mail two months in advance. If you don't receive a letter, please call our office to schedule the follow-up appointment.

## 2014-11-15 NOTE — Progress Notes (Signed)
[facility] Patient Care Team: Lonie Peak, PA-C as PCP - General (Physician Assistant)   HPI  Adam Gillespie is a 59 y.o. male Seen in followup for an ICD implanted 2006 the setting of ischemic heart disease and prior bypass surgery x5 in 2001  His device was changed out 10/15 with the insertion of a new ICD lead.  He has been seen intercurrently by Dr. Shelle Iron because of his severe sleep apnea.   Records indicate nuclear study in  Demonstrated 10/15  Low risk stress nuclear study . There is evidence of a previous anteriorlateral MI that is old. .LV Ejection Fraction: 29%. LV Wall Motion: akinesis / severe hypokinesis of the anterior lateral wall.  Echocardiogram 04/2012: EF 25%, inferior, inferoseptal, distal anterior and apical HK, mild AI, mild MR.   The patient denies chest pain, shortness of breath, nocturnal dyspnea, orthopnea or peripheral edema.  There have been no palpitations, lightheadedness or syncope            Past Medical History  Diagnosis Date  . Anoxic encephalopathy     POSTOP FROM 2001  . LV dysfunction     EF is 25% per echo in July 2013  . Carotid stenosis, right     60-79% right, 40 - 59% left - needs dopplers in June 2014  . Hyperlipidemia   . Gout   . Hypothyroidism   . Ischemic cardiomyopathy     Remote MI with CABG x 5 in 2001; Myocardial perfusion imaging EF 26% old infarct anterior wall. 04/2010  . 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 Hearing deficit     Bilateral ears  . 7510 lead 05/01/2014  . Automatic implantable cardiac defibrillator -Medtronic 04/23/2011    a. 07/2014 gen change -  Medtronic EVERA single chamber ICD, serial number  [id number].  . ACE inhibitor intolerance     Renal insufficiency/hyperkalemia  . Coronary artery disease   . Heart murmur   . CHF (congestive heart failure)     Past Surgical History  Procedure Laterality Date  . Cardiac catheterization  08/05/2000    THERE IS ANTERIOR AND APICAL AKINESIS. EF IS  ESTIMATED 20%, WITH INFEROBASILAR PORTIONS CONTRACTING REASONABLY WELL.  Marland Kitchen Coronary artery bypass graft      x5. lima to the lad, saphenous vein graft to the intermediate and obtuse mariginal, and a saphenous vein graft to the acute mariginal and distal right coronary artery  . Cardiac defibrillator placement    . Colonoscopy with propofol N/A 01/09/2013    Procedure: COLONOSCOPY WITH PROPOFOL;  Surgeon: Shirley Friar, MD;  Location: [facility] ENDOSCOPY;  Service: Endoscopy;  Laterality: N/A;  . Esophagogastroduodenoscopy N/A 07/11/2013    Procedure: ESOPHAGOGASTRODUODENOSCOPY (EGD);  Surgeon: Shirley Friar, MD;  Location: [facility] ENDOSCOPY;  Service: Endoscopy;  Laterality: N/A;  . Savory dilation N/A 07/11/2013    Procedure: SAVORY DILATION;  Surgeon: Shirley Friar, MD;  Location: [facility] ENDOSCOPY;  Service: Endoscopy;  Laterality: N/A;  . Balloon dilation N/A 07/11/2013    Procedure: BALLOON DILATION;  Surgeon: Shirley Friar, MD;  Location: [facility] ENDOSCOPY;  Service: Endoscopy;  Laterality: N/A;  . Esophageal dilation  2015  . Implantable cardioverter defibrillator (icd) generator change N/A 07/31/2014    Procedure: ICD GENERATOR CHANGE;  Surgeon: Duke Salvia, MD;  Location: [facility] CATH LAB;  Service: Cardiovascular;  Laterality: N/A;  . Lead revision N/A 07/31/2014    Procedure: LEAD REVISION;  Surgeon: Duke Salvia, MD;  Location: [facility] CATH LAB;  Service: Cardiovascular;  Laterality: N/A;  . Tonsillectomy    . Adenoidectomy      Current Outpatient Prescriptions  Medication Sig Dispense Refill  . allopurinol (ZYLOPRIM) 100 MG tablet Take 1 tablet (100 mg total) by mouth daily. 30 tablet 11  . aspirin 81 MG tablet Take 81 mg by mouth daily.      . furosemide (LASIX) 20 MG tablet Take 20 mg by mouth 3 (three) times a week. Tuesday, Thursday, Saturday    . hydrALAZINE (APRESOLINE) 25 MG tablet TAKE 1/2 TABLET TWICE A DAY 90 tablet 2  . isosorbide mononitrate (IMDUR) 30 MG 24 hr tablet  Take 1 tablet (30 mg total) by mouth daily. 30 tablet 11  . levothyroxine (SYNTHROID, LEVOTHROID) 125 MCG tablet Take 1 tablet (125 mcg total) by mouth daily. 30 tablet 11  . metoprolol succinate (TOPROL-XL) 50 MG 24 hr tablet Take 1 tablet (50 mg total) by mouth daily. 30 tablet 11  . omeprazole (PRILOSEC OTC) 20 MG tablet Take 40 mg by mouth daily.     Marland Kitchen oxybutynin (DITROPAN-XL) 10 MG 24 hr tablet Take 10 mg by mouth daily.    . simvastatin (ZOCOR) 40 MG tablet Take 1 tablet (40 mg total) by mouth at bedtime. 30 tablet 11   No current facility-administered medications for this visit.    Allergies  Allergen Reactions  . Ace Inhibitors     Contributed to hyperkalemia and renal insufficiency in the past (see office note 10/03/2012)    Review of Systems negative except from HPI and PMH  Physical Exam BP 122/68 mmHg  Pulse 71  Ht  (1.626 m)  Wt 148 lb 6.4 oz (67.314 kg)  BMI 25.46 kg/m2 Well developed and well nourished in no acute distress HENT normal E scleral and icterus clear Neck Supple Clear to ausculation anteriorly and posteriorly Device pocket well healed; without hematoma or erythema.  There is no tethering Regular rate and rhythm, no murmurs gallops or rub Soft with active bowel sounds No clubbing cyanosis none Edema Alert and oriented, grossly normal motor and sensory function Skin Warm and Dry  b  Assessment and  Plan  Ischemic cardiomyopathy  Sleep-disordered breathing/daytime fatigue  Ace Intolerance--  On hydralazine nitrates  implantable defibrillator-Medtronic    Stable. No symptoms of ischemia. He is euvolemic. There are no symptoms of heart failure. Blood pressure is well-controlled. He is to go for sleep mask fitting in a couple of months.

## 2014-11-18 DIAGNOSIS — G4730 Sleep apnea, unspecified: Secondary | ICD-10-CM

## 2014-11-18 HISTORY — DX: Sleep apnea, unspecified: G47.30

## 2014-11-27 ENCOUNTER — Other Ambulatory Visit: Payer: Self-pay

## 2014-11-27 MED ORDER — SIMVASTATIN 40 MG PO TABS: 40.0000 mg | ORAL_TABLET | Freq: Every day | ORAL | Status: DC

## 2014-11-27 MED ORDER — ISOSORBIDE MONONITRATE ER 30 MG PO TB24: 30.0000 mg | ORAL_TABLET | Freq: Every day | ORAL | Status: DC

## 2014-12-18 ENCOUNTER — Other Ambulatory Visit: Payer: Self-pay | Admitting: Radiology

## 2014-12-18 DIAGNOSIS — I6523 Occlusion and stenosis of bilateral carotid arteries: Secondary | ICD-10-CM

## 2014-12-25 ENCOUNTER — Encounter (HOSPITAL_COMMUNITY): Payer: Medicare Other

## 2014-12-25 ENCOUNTER — Ambulatory Visit: Payer: Medicare HMO | Admitting: Nurse Practitioner

## 2015-01-01 ENCOUNTER — Ambulatory Visit (HOSPITAL_COMMUNITY): Payer: Medicare Other | Attending: Cardiovascular Disease | Admitting: *Deleted

## 2015-01-01 ENCOUNTER — Ambulatory Visit (INDEPENDENT_AMBULATORY_CARE_PROVIDER_SITE_OTHER): Payer: Medicare Other | Admitting: Physician Assistant

## 2015-01-01 ENCOUNTER — Encounter: Payer: Self-pay | Admitting: Physician Assistant

## 2015-01-01 VITALS — BP 100/70 | HR 69 | Ht 64.0 in | Wt 148.0 lb

## 2015-01-01 DIAGNOSIS — E785 Hyperlipidemia, unspecified: Secondary | ICD-10-CM

## 2015-01-01 DIAGNOSIS — I2510 Atherosclerotic heart disease of native coronary artery without angina pectoris: Secondary | ICD-10-CM

## 2015-01-01 DIAGNOSIS — I6523 Occlusion and stenosis of bilateral carotid arteries: Secondary | ICD-10-CM | POA: Insufficient documentation

## 2015-01-01 DIAGNOSIS — I5022 Chronic systolic (congestive) heart failure: Secondary | ICD-10-CM

## 2015-01-01 DIAGNOSIS — I255 Ischemic cardiomyopathy: Secondary | ICD-10-CM

## 2015-01-01 DIAGNOSIS — Z95810 Presence of automatic (implantable) cardiac defibrillator: Secondary | ICD-10-CM

## 2015-01-01 NOTE — Progress Notes (Signed)
Carotid Duplex Scan Performed - progression of disease

## 2015-01-01 NOTE — Patient Instructions (Signed)
Your physician recommends that you continue on your current medications as directed. Please refer to the Current Medication list given to you today.    LABS RX SENT WITH YOU FOR PCP AND RESULTS TO BE FAXED TO [facility] [phone]   Your physician wants you to follow-up in:  WITH LORI GERHARDT IN 6 MONTHS  You will receive a reminder letter in the mail two months in advance. If you don't receive a letter, please call our office to schedule the follow-up appointment.   REFFERAL NEEDED FOR VASCULAR SURGERY NOTES IN COMMENTS FOR REASON

## 2015-01-01 NOTE — Progress Notes (Signed)
Cardiology Office Note   Date:  01/01/2015   ID:  Adam Gillespie, DOB [birth year], MRN [id number]  PCP:  [name]  Cardiologist:  Dr. Rollene Rotunda  Adam Fredrickson, NP) Electrophysiologist:  Dr. Sherryl Manges   Chief Complaint  Patient presents with  . Coronary Artery Disease  . Cardiomyopathy  . Follow-up     History of Present Illness: Adam Gillespie is a 59 y.o. male with a hx of CAD, status post CABG in 2001, ischemic cardiomyopathy with an EF of 25%, status post AICD, hypothyroidism, HL, GERD, CKD, gout, hypothyroidism. 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 He has a tendency towards hyperkalemia and is not on an ACE inhibitor.  Myoview 07/2014 low risk. Carotid ultrasound 05/2014 with moderate, stable bilateral ICA stenosis.  Last seen by Adam Fredrickson, NP 05/2014. He returns for follow-up.    He is here with his niece who is his power of attorney. She helps with the history. He has mild shortness of breath with exertion. This is unchanged. He denies chest pain. He denies orthopnea or PND. He denies syncope. He denies ICD discharge. He has trace pedal edema without significant change.   Studies/Reports Reviewed Today:  Myoview 07/2014 Low risk stress nuclear study .  There is evidence of a previous anteriorlateral MI that is old. No ischemia LV Ejection Fraction: 29%. LV Wall Motion: akinesis / severe hypokinesis of the anterior lateral wall.  Carotid US 8/15 Bilateral ICA 60-79% >50% LECA FU 6 mos  Echo 7/13 - EF 25% with akinesis of theinferior,inferoseptal, distal anterior and apical walls; hypookinesis elsewhere. . - Aortic valve: Mild regurgitation. - Mitral valve: Mild regurgitation.   Past Medical History  Diagnosis Date  . Anoxic encephalopathy     POSTOP FROM 2001  . LV dysfunction     EF is 25% per echo in July 2013  . Carotid stenosis, right     60-79% right, 40 - 59% left - needs dopplers in June 2014  .  Hyperlipidemia   . Gout   . Hypothyroidism   . Ischemic cardiomyopathy     Remote MI with CABG x 5 in 2001; Myocardial perfusion imaging EF 26% old infarct anterior wall. 04/2010  . 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 Hearing deficit     Bilateral ears  . 2637 lead 05/01/2014  . Automatic implantable cardiac defibrillator -Medtronic 04/23/2011    a. 07/2014 gen change -  Medtronic EVERA single chamber ICD, serial number  [id number] H.  . ACE inhibitor intolerance     Renal insufficiency/hyperkalemia  . Coronary artery disease   . Heart murmur   . CHF (congestive heart failure)     Past Surgical History  Procedure Laterality Date  . Cardiac catheterization  08/05/2000    THERE IS ANTERIOR AND APICAL AKINESIS. EF IS ESTIMATED 20%, WITH INFEROBASILAR PORTIONS CONTRACTING REASONABLY WELL.  Marland Kitchen Coronary artery bypass graft      x5. lima to the lad, saphenous vein graft to the intermediate and obtuse mariginal, and a saphenous vein graft to the acute mariginal and distal right coronary artery  . Cardiac defibrillator placement    . Colonoscopy with propofol N/A 01/09/2013    Procedure: COLONOSCOPY WITH PROPOFOL;  Surgeon: Shirley Friar, MD;  Location: [facility] ENDOSCOPY;  Service: Endoscopy;  Laterality: N/A;  . Esophagogastroduodenoscopy N/A 07/11/2013    Procedure: ESOPHAGOGASTRODUODENOSCOPY (EGD);  Surgeon: Shirley Friar, MD;  Location: [facility] ENDOSCOPY;  Service: Endoscopy;  Laterality: N/A;  .  Savory dilation N/A 07/11/2013    Procedure: SAVORY DILATION;  Surgeon: Shirley Friar, MD;  Location: [facility] ENDOSCOPY;  Service: Endoscopy;  Laterality: N/A;  . Balloon dilation N/A 07/11/2013    Procedure: BALLOON DILATION;  Surgeon: Shirley Friar, MD;  Location: [facility] ENDOSCOPY;  Service: Endoscopy;  Laterality: N/A;  . Esophageal dilation  2015  . Implantable cardioverter defibrillator (icd) generator change N/A 07/31/2014    Procedure: ICD GENERATOR CHANGE;  Surgeon: Duke Salvia,  MD;  Location: [facility] CATH LAB;  Service: Cardiovascular;  Laterality: N/A;  . Lead revision N/A 07/31/2014    Procedure: LEAD REVISION;  Surgeon: Duke Salvia, MD;  Location: [facility] CATH LAB;  Service: Cardiovascular;  Laterality: N/A;  . Tonsillectomy    . Adenoidectomy       Current Outpatient Prescriptions  Medication Sig Dispense Refill  . allopurinol (ZYLOPRIM) 100 MG tablet Take 1 tablet (100 mg total) by mouth daily. 30 tablet 11  . aspirin 81 MG tablet Take 81 mg by mouth daily.      . furosemide (LASIX) 20 MG tablet Take 20 mg by mouth 3 (three) times a week. Tuesday, Thursday, Saturday    . hydrALAZINE (APRESOLINE) 25 MG tablet TAKE 1/2 TABLET TWICE A DAY 90 tablet 2  . isosorbide mononitrate (IMDUR) 30 MG 24 hr tablet Take 1 tablet (30 mg total) by mouth daily. 90 tablet 1  . levothyroxine (SYNTHROID, LEVOTHROID) 125 MCG tablet Take 1 tablet (125 mcg total) by mouth daily. 30 tablet 11  . metoprolol succinate (TOPROL-XL) 50 MG 24 hr tablet Take 1 tablet (50 mg total) by mouth daily. 30 tablet 11  . omeprazole (PRILOSEC) 40 MG capsule Take 40 mg by mouth daily.    Marland Kitchen oxybutynin (DITROPAN-XL) 10 MG 24 hr tablet Take 10 mg by mouth daily.    . simvastatin (ZOCOR) 40 MG tablet Take 1 tablet (40 mg total) by mouth at bedtime. 90 tablet 1   No current facility-administered medications for this visit.    Allergies:   Ace inhibitors    Social History:  The patient  reports that he has never smoked. His smokeless tobacco use includes Chew. He reports that he does not drink alcohol or use illicit drugs.   Family History:  The patient's family history includes Asthma in his sister; Cancer in his brother and father; Emphysema in his father and sister; Heart attack in his maternal grandfather and mother; Stroke in his maternal grandmother.    ROS:   Please see the history of present illness.   Review of Systems  Cardiovascular: Positive for leg swelling.  All other systems reviewed and  are negative.     PHYSICAL EXAM: VS:  BP 100/70 mmHg  Pulse 69  Ht  (1.626 m)  Wt 148 lb (67.132 kg)  BMI 25.39 kg/m2    Wt Readings from Last 3 Encounters:  01/01/15 148 lb (67.132 kg)  11/15/14 148 lb 6.4 oz (67.314 kg)  11/13/14 149 lb 3.2 oz (67.677 kg)     GEN: Well nourished, well developed, in no acute distress HEENT: normal Neck: I cannot assess JVD, no masses Cardiac:  Normal S1/S2, RRR; no murmur ,  no rubs or gallops, trace edema  Respiratory:  clear to auscultation bilaterally, no wheezing, rhonchi or rales. GI: soft, nontender, nondistended, + BS MS: no deformity or atrophy Skin: warm and dry  Neuro:  CNs II-XII intact, Strength and sensation are intact Psych: Normal affect   EKG:  EKG is ordered today.  It demonstrates:   NSR, HR 69, LBBB   Recent Labs: 06/03/2014: ALT 17 07/25/2014: BUN 22; Creatinine 1.5; Hemoglobin 15.6; Platelets 364.0; Potassium 5.0; Sodium 141    Lipid Panel    Component Value Date/Time   CHOL 142 06/03/2014 0827   TRIG 210.0* 06/03/2014 0827   HDL 32.50* 06/03/2014 0827   CHOLHDL 4 06/03/2014 0827   VLDL 42.0* 06/03/2014 0827   LDLCALC 70 11/01/2011 0852   LDLDIRECT 87.5 06/03/2014 0827      ASSESSMENT AND PLAN:  Coronary artery disease involving native coronary artery of native heart without angina pectoris No angina. Continue aspirin, nitrates, beta blocker, statin.  Ischemic cardiomyopathy Continue current dose of hydralazine, nitrates, beta blocker. He is not a candidate for ACE inhibitor given prior history of hyperkalemia. Arrange follow-up basic metabolic panel with cholesterol labs.  Chronic systolic congestive heart failure He is NYHA 2-2b.  Hyperlipemia Continue statin. Arrange lipids and LFTs.  Automatic implantable cardioverter-defibrillator in situ Follow up with EP as planned.  Carotid stenosis, bilateral  Carotid Dopplers today demonstrate worsening stenosis on the right in the 80-99% range. He  will be referred to [facility].   Current medicines are reviewed at length with the patient today.  The patient does not have concerns regarding medicines.  The following changes have been made:  As above.   Labs/ tests ordered today include:   Orders Placed This Encounter  Procedures  . Ambulatory referral to Vascular Surgery  . EKG 12-Lead    Disposition:   FU with Adam Fredrickson, NP 6 mos.    Signed, Brynda Rim, MHS 01/01/2015 10:49 AM    [facility] [address] [place], [place], [place]  [address] Phone: [phone]; Fax: [phone]

## 2015-01-03 ENCOUNTER — Other Ambulatory Visit: Payer: Self-pay

## 2015-01-03 DIAGNOSIS — I6523 Occlusion and stenosis of bilateral carotid arteries: Secondary | ICD-10-CM

## 2015-01-20 ENCOUNTER — Other Ambulatory Visit: Payer: Self-pay | Admitting: *Deleted

## 2015-01-20 MED ORDER — HYDRALAZINE HCL 25 MG PO TABS: 12.5000 mg | ORAL_TABLET | Freq: Two times a day (BID) | ORAL | Status: DC

## 2015-01-21 ENCOUNTER — Other Ambulatory Visit: Payer: Self-pay | Admitting: *Deleted

## 2015-01-21 MED ORDER — METOPROLOL SUCCINATE ER 50 MG PO TB24: 50.0000 mg | ORAL_TABLET | Freq: Every day | ORAL | Status: DC

## 2015-01-22 ENCOUNTER — Ambulatory Visit (HOSPITAL_BASED_OUTPATIENT_CLINIC_OR_DEPARTMENT_OTHER): Payer: Medicare Other | Attending: Pulmonary Disease | Admitting: *Deleted

## 2015-01-22 VITALS — Ht 64.0 in | Wt 148.0 lb

## 2015-01-22 DIAGNOSIS — I493 Ventricular premature depolarization: Secondary | ICD-10-CM | POA: Diagnosis not present

## 2015-01-22 DIAGNOSIS — G4710 Hypersomnia, unspecified: Secondary | ICD-10-CM | POA: Diagnosis present

## 2015-01-22 DIAGNOSIS — G4730 Sleep apnea, unspecified: Secondary | ICD-10-CM | POA: Insufficient documentation

## 2015-01-22 DIAGNOSIS — G4731 Primary central sleep apnea: Secondary | ICD-10-CM

## 2015-01-23 ENCOUNTER — Other Ambulatory Visit (HOSPITAL_COMMUNITY): Payer: Medicare Other

## 2015-01-23 ENCOUNTER — Encounter: Payer: Medicare Other | Admitting: Vascular Surgery

## 2015-01-31 ENCOUNTER — Telehealth: Payer: Self-pay | Admitting: Pulmonary Disease

## 2015-01-31 DIAGNOSIS — G4733 Obstructive sleep apnea (adult) (pediatric): Secondary | ICD-10-CM | POA: Diagnosis not present

## 2015-01-31 NOTE — Telephone Encounter (Signed)
Called spoke with pt niece. She is going to call me back on Monday.

## 2015-01-31 NOTE — Telephone Encounter (Signed)
Pt needs ov to review his titration study

## 2015-01-31 NOTE — Sleep Study (Signed)
   NAME: Adam Gillespie DATE OF BIRTH:  17-Jun-1956 MEDICAL RECORD NUMBER [id number]  LOCATION: [facility]  PHYSICIAN: Barbaraann Share  DATE OF STUDY: 01/22/2015  SLEEP STUDY TYPE: Nocturnal Polysomnogram               REFERRING PHYSICIAN: Eriberto Felch, Maree Krabbe, MD  INDICATION FOR STUDY: Hypersomnia with sleep apnea  EPWORTH SLEEPINESS SCORE:  17 HEIGHT: 5\' 4"  (162.6 cm)  WEIGHT: 148 lb (67.132 kg)    Body mass index is 25.39 kg/(m^2).  NECK SIZE: 17.5 in.  MEDICATIONS: Reviewed in the sleep record  SLEEP ARCHITECTURE: The patient had a total sleep time of 151 minutes, with no slow-wave sleep and only 27 minutes of REM. Sleep onset latency was prolonged at 69 minutes, and REM onset was delayed at 282 minutes. Sleep efficiency was poor at 40%.  RESPIRATORY DATA: The patient underwent a C Pap titration study with a medium Fischer Paykel Simplus full face mask.  He had very poor tolerance of C Pap, and was therefore changed to bilevel and titrated to a final pressure of 18/14. He had persistent mixed and obstructive apneas even on this setting, however there was significant mask leak secondary to his beard.  OXYGEN DATA: There was oxygen desaturation as low as 73% with the patient's obstructive events  CARDIAC DATA: Occasional PVC noted  MOVEMENT/PARASOMNIA: No significant periodic limb movements or other abnormal behaviors were seen.  IMPRESSION/ RECOMMENDATION:    1) Inadequate control of the patient's complex apnea with a bilevel pressure of 18/14, and delivered by a medium Fischer Paykel Simplus full face mask. Part of this may be secondary to the patient's significant mask leak secondary to his large beard. Perhaps if he were to shave this somewhat, or consider a nasal mask, we could have more adequate control of his complex apnea with more appropriate bilevel pressures. Clinical correlation is suggested.  2) occasional PVC noted.   Barbaraann Share Diplomate,  American Board of Sleep Medicine  ELECTRONICALLY SIGNED ON:  01/31/2015, 4:56 PM [facility] PH: [phone]   FX: [phone] ACCREDITED BY THE AMERICAN ACADEMY OF SLEEP MEDICINE

## 2015-02-03 NOTE — Telephone Encounter (Signed)
lmomtcb x1 

## 2015-02-04 NOTE — Telephone Encounter (Signed)
lmomtcb x 2  

## 2015-02-06 NOTE — Telephone Encounter (Signed)
lmtcb x1 

## 2015-02-06 NOTE — Telephone Encounter (Signed)
Return call from gerald Katrinka Blazing [phone].Duffy Rhody A Dalton\

## 2015-02-07 NOTE — Telephone Encounter (Signed)
Made appt for pt to get sleep study results per Mindy. That is all that is needed.

## 2015-02-12 ENCOUNTER — Encounter: Payer: Self-pay | Admitting: Vascular Surgery

## 2015-02-13 ENCOUNTER — Ambulatory Visit (HOSPITAL_COMMUNITY)
Admission: RE | Admit: 2015-02-13 | Discharge: 2015-02-13 | Disposition: A | Discharge status: Home / Self Care | Payer: Medicare Other | Source: Ambulatory Visit | Attending: Vascular Surgery | Admitting: Vascular Surgery

## 2015-02-13 ENCOUNTER — Encounter: Payer: Self-pay | Admitting: Vascular Surgery

## 2015-02-13 ENCOUNTER — Ambulatory Visit (INDEPENDENT_AMBULATORY_CARE_PROVIDER_SITE_OTHER): Payer: Medicare Other | Admitting: Vascular Surgery

## 2015-02-13 VITALS — BP 110/59 | HR 76 | Resp 16 | Ht 64.0 in | Wt 147.8 lb

## 2015-02-13 DIAGNOSIS — I6523 Occlusion and stenosis of bilateral carotid arteries: Secondary | ICD-10-CM | POA: Diagnosis not present

## 2015-02-13 DIAGNOSIS — Z01810 Encounter for preprocedural cardiovascular examination: Secondary | ICD-10-CM

## 2015-02-13 DIAGNOSIS — I6521 Occlusion and stenosis of right carotid artery: Secondary | ICD-10-CM | POA: Diagnosis not present

## 2015-02-13 NOTE — Progress Notes (Signed)
New Carotid Patient  Referred by:  Lonie Peak, PA-C [facility] [address] [place], [place] [address]  Reason for referral: right carotid stenosis  History of Present Illness  Adam Gillespie is a 59 y.o. (06-Dec-1955) male who presents with chief complaint: right carotid stenosis.  Previous carotid studies demonstrated: RICA 80-99% stenosis, LICA 60-79% stenosis.  Patient has no history of TIA or stroke symptom.  The patient has not had amaurosis fugax or monocular blindness.  The patient has not had facial drooping or hemiplegia.  The patient has not had receptive or expressive aphasia.  The patient does have a chronic speech impediment at baseline and hearing difficulty. His niece assists him during questions during this history.    The patient has a history of CAD s/p CABG x 5 in 2001. He has an ICD which was last changed in October 2015. He has LV dysfunction with EF of 25% per ECHO in 2013. His cardiologists are Drs. Hochrin and Graciela Husbands. He had a low risk stress nuclear study back in October 2015. He has hyperlipidemia managed on a statin. He takes a daily aspirin. His hypertension is well managed.   The patient denies any chest pain or shortness of breath. Per his family however, the patient has dyspnea with minimal exertion. He denies any orthopnea or PND. He was recently diagnosed with sleep apnea and in the process of getting a CPAP. He is followed by Dr. Shelle Iron.   Past Medical History  Diagnosis Date  . Anoxic encephalopathy     POSTOP FROM 2001  . LV dysfunction     EF is 25% per echo in July 2013  . Carotid stenosis, right     60-79% right, 40 - 59% left - needs dopplers in June 2014  . Hyperlipidemia   . Gout   . Hypothyroidism   . Ischemic cardiomyopathy     Remote MI with CABG x 5 in 2001; Myocardial perfusion imaging EF 26% old infarct anterior wall. 04/2010  . 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 Hearing deficit     Bilateral ears  . 6045 lead  05/01/2014  . Automatic implantable cardiac defibrillator -Medtronic 04/23/2011    a. 07/2014 gen change -  Medtronic EVERA single chamber ICD, serial number  [id number] H.  . ACE inhibitor intolerance     Renal insufficiency/hyperkalemia  . Coronary artery disease   . Heart murmur   . CHF (congestive heart failure)   . Sleep apnea 11-2014    Past Surgical History  Procedure Laterality Date  . Cardiac catheterization  08/05/2000    THERE IS ANTERIOR AND APICAL AKINESIS. EF IS ESTIMATED 20%, WITH INFEROBASILAR PORTIONS CONTRACTING REASONABLY WELL.  Marland Kitchen Coronary artery bypass graft      x5. lima to the lad, saphenous vein graft to the intermediate and obtuse mariginal, and a saphenous vein graft to the acute mariginal and distal right coronary artery  . Cardiac defibrillator placement    . Colonoscopy with propofol N/A 01/09/2013    Procedure: COLONOSCOPY WITH PROPOFOL;  Surgeon: Shirley Friar, MD;  Location: [facility] ENDOSCOPY;  Service: Endoscopy;  Laterality: N/A;  . Esophagogastroduodenoscopy N/A 07/11/2013    Procedure: ESOPHAGOGASTRODUODENOSCOPY (EGD);  Surgeon: Shirley Friar, MD;  Location: [facility] ENDOSCOPY;  Service: Endoscopy;  Laterality: N/A;  . Savory dilation N/A 07/11/2013    Procedure: SAVORY DILATION;  Surgeon: Shirley Friar, MD;  Location: [facility] ENDOSCOPY;  Service: Endoscopy;  Laterality: N/A;  . Balloon dilation N/A  07/11/2013    Procedure: BALLOON DILATION;  Surgeon: Shirley Friar, MD;  Location: [facility] ENDOSCOPY;  Service: Endoscopy;  Laterality: N/A;  . Esophageal dilation  2015  . Implantable cardioverter defibrillator (icd) generator change N/A 07/31/2014    Procedure: ICD GENERATOR CHANGE;  Surgeon: Duke Salvia, MD;  Location: [facility] CATH LAB;  Service: Cardiovascular;  Laterality: N/A;  . Lead revision N/A 07/31/2014    Procedure: LEAD REVISION;  Surgeon: Duke Salvia, MD;  Location: [facility] CATH LAB;  Service: Cardiovascular;  Laterality: N/A;  . Tonsillectomy    .  Adenoidectomy      History   Social History  . Marital Status: Single    Spouse Name: N/A  . Number of Children: 0  . Years of Education: N/A   Occupational History  . retired    Social History Main Topics  . Smoking status: Never Smoker   . Smokeless tobacco: Current User    Types: Chew  . Alcohol Use: No  . Drug Use: No  . Sexual Activity: No   Other Topics Concern  . Not on file   Social History Narrative    Family History  Problem Relation Age of Onset  . Heart attack Mother   . Cancer Father   . Cancer Brother   . Asthma Sister   . Emphysema Father   . Emphysema Sister   . Heart attack Maternal Grandfather   . Stroke Maternal Grandmother     Current Outpatient Prescriptions on File Prior to Visit  Medication Sig Dispense Refill  . allopurinol (ZYLOPRIM) 100 MG tablet Take 1 tablet (100 mg total) by mouth daily. 30 tablet 11  . aspirin 81 MG tablet Take 81 mg by mouth daily.      . furosemide (LASIX) 20 MG tablet Take 20 mg by mouth 3 (three) times a week. Tuesday, Thursday, Saturday    . hydrALAZINE (APRESOLINE) 25 MG tablet Take 0.5 tablets (12.5 mg total) by mouth 2 (two) times daily. 90 tablet 1  . isosorbide mononitrate (IMDUR) 30 MG 24 hr tablet Take 1 tablet (30 mg total) by mouth daily. 90 tablet 1  . levothyroxine (SYNTHROID, LEVOTHROID) 125 MCG tablet Take 1 tablet (125 mcg total) by mouth daily. 30 tablet 11  . metoprolol succinate (TOPROL-XL) 50 MG 24 hr tablet Take 1 tablet (50 mg total) by mouth daily. 30 tablet 5  . omeprazole (PRILOSEC) 40 MG capsule Take 40 mg by mouth daily.    Marland Kitchen oxybutynin (DITROPAN-XL) 10 MG 24 hr tablet Take 10 mg by mouth daily.    . simvastatin (ZOCOR) 40 MG tablet Take 1 tablet (40 mg total) by mouth at bedtime. 90 tablet 1   No current facility-administered medications on file prior to visit.    Allergies  Allergen Reactions  . Ace Inhibitors     Contributed to hyperkalemia and renal insufficiency in the past  (see office note 10/03/2012)    REVIEW OF SYSTEMS:  (Positives checked otherwise negative)  CARDIOVASCULAR:  []  chest pain, []  chest pressure, []  palpitations, []  shortness of breath when laying flat, []  shortness of breath with exertion,  []  pain in feet when walking, []  pain in feet when laying flat, []  history of blood clot in veins (DVT), []  history of phlebitis, []  swelling in legs, []  varicose veins  PULMONARY:  []  productive cough, []  asthma, []  wheezing  NEUROLOGIC:  []  weakness in arms or legs, []  numbness in arms or legs, []  difficulty speaking or slurred  speech,  temporary loss of vision in one eye,  dizziness  HEMATOLOGIC:   bleeding problems,  problems with blood clotting too easily  MUSCULOSKEL:   joint pain,  joint swelling  GASTROINTEST:   vomiting blood,  blood in stool     GENITOURINARY:   burning with urination,  blood in urine  PSYCHIATRIC:   history of major depression  INTEGUMENTARY:   rashes,  ulcers  CONSTITUTIONAL:   fever,  chills  For VQI Use Only  PRE-ADM LIVING: Home  AMB STATUS: Ambulatory  CAD Sx: History of MI, but no symptoms No MI within 6 months  PRIOR CHF: Moderate  STRESS TEST:  No,  Normal,  + ischemia,  + MI,  Both   Physical Examination  Filed Vitals:   02/13/15 1316  BP: 110/59  Pulse: 76  Resp: 16  Height:  (1.626 m)  Weight: 147 lb 12.8 oz (67.042 kg)   Body mass index is 25.36 kg/(m^2).  General: A&O x 3, WDWN male in NAD  Head: [place]/AT  Neck: Supple  Pulmonary: Sym exp, good air movt, CTAB, no rales, rhonchi, & wheezing  Cardiac: RRR, Nl S1, S2, no Murmurs, rubs or gallops  Vascular: 2+ radial pulses bilaterally, 2+ femoral pulses bilaterally  Gastrointestinal: soft, NTND, no masses, no distension  Musculoskeletal: M/S 5/5 throughout  Neurologic: CN 2-12 grossly intact, speech is difficult to understand  Psychiatric: Judgment intact, Mood & affect  appropriate for pt's clinical situation  Dermatologic: See M/S exam for extremity exam, no rashes otherwise noted  Non-Invasive Vascular Imaging  CAROTID DUPLEX (Date: 02/13/2015):   R ICA stenosis: 80-99%  R VA:  patent and antegrade  Medical Decision Making  Adam Gillespie is a 59 y.o. male who presents with: asymptomatic right ICA stenosis 80-99%.  Given the patient's cardiac co-morbidities, the patient will need to have a CTA of the neck to evaluate for carotid stenting versus carotid endarterectomy. The patient's last stress test was in October 2015 and was low risk. The patient understands that there is a 5% risk per year of stroke. He is currently on an aspirin and statin. He will follow up after his CTA for continued discussion.    Maris Berger, PA-C [facility] Office: [phone] Pager: [phone]  02/13/2015, 1:54 PM  This patient was seen in conjunction with Dr. Darrick Penna.    History and exam findings as above. The patient has a greater than 80% right internal carotid artery stenosis. He has minimal cardiac reserve due to prior severe myocardial infarction. He did have a negative stress test proximally 6 months ago. However he becomes short of breath after walking about 25 yards. We will obtain a CT scan of the neck to see if he has anatomy that may be favorable for carotid stenting as this would be less cardiac stress. If the lesion is unknown amenable to stenting we would need to consider proceeding with right carotid endarterectomy. The patient will continue his aspirin.  Fabienne Bruns, MD [facility] Office: [phone] Pager: [phone]

## 2015-02-17 ENCOUNTER — Other Ambulatory Visit: Payer: Medicare Other

## 2015-02-17 ENCOUNTER — Telehealth: Payer: Self-pay | Admitting: Cardiology

## 2015-02-17 ENCOUNTER — Encounter: Payer: Medicare Other | Admitting: *Deleted

## 2015-02-17 NOTE — Telephone Encounter (Signed)
Confirmed remote transmission w/ pt niece.   

## 2015-02-18 ENCOUNTER — Encounter: Payer: Self-pay | Admitting: Vascular Surgery

## 2015-02-18 ENCOUNTER — Encounter: Payer: Self-pay | Admitting: Cardiology

## 2015-02-19 ENCOUNTER — Ambulatory Visit: Payer: Medicare Other | Admitting: Vascular Surgery

## 2015-02-19 ENCOUNTER — Inpatient Hospital Stay: Admission: RE | Admit: 2015-02-19 | Payer: Medicare Other | Source: Ambulatory Visit

## 2015-02-20 ENCOUNTER — Ambulatory Visit: Payer: Medicare Other | Admitting: Vascular Surgery

## 2015-02-26 ENCOUNTER — Ambulatory Visit (INDEPENDENT_AMBULATORY_CARE_PROVIDER_SITE_OTHER): Payer: Medicare Other | Admitting: Pulmonary Disease

## 2015-02-26 ENCOUNTER — Encounter: Payer: Self-pay | Admitting: Pulmonary Disease

## 2015-02-26 VITALS — BP 124/72 | HR 86 | Temp 98.3°F | Ht 64.0 in | Wt 146.6 lb

## 2015-02-26 DIAGNOSIS — G4733 Obstructive sleep apnea (adult) (pediatric): Secondary | ICD-10-CM | POA: Diagnosis not present

## 2015-02-26 DIAGNOSIS — G4737 Central sleep apnea in conditions classified elsewhere: Secondary | ICD-10-CM

## 2015-02-26 DIAGNOSIS — G4731 Primary central sleep apnea: Secondary | ICD-10-CM

## 2015-02-26 NOTE — Assessment & Plan Note (Addendum)
The patient's recent titration study shows that he could not tolerate C Pap, and was changed to bilevel with better success. There was issues with mask leak from his large beard, and he was titrated as high as 18/14 with persistent mixed and obstructive apneas. He appeared to have adequate control of his central events with bilevel, but is not a candidate for ASV because of his underlying cardiomyopathy. The patient is unsure if he will ever be able to tolerate a bilevel device, and his only other option is to get him on nocturnal oxygen to prevent his desaturations. However, he understands this will not treat his sleep apnea. I have asked the patient to think about his options, and he will discuss with his family and get back with me.  If he does decide to try bilevel, we will need to try a nasal mask or nasal pillows in light of his large beard.

## 2015-02-26 NOTE — Patient Instructions (Signed)
You need to decide to treat your sleep apnea with a bipap device with a nasal mask versus just wearing oxygen at night. Oxygen will keep your level up during sleep, but will not treat your sleep apnea Please call us to let us know what you decide.

## 2015-02-26 NOTE — Progress Notes (Signed)
   Subjective:    Patient ID: Adam Gillespie, male    DOB: June 27, 1956, 59 y.o.   MRN: [id number]  HPI The patient comes in today for follow-up of his recent bilevel titration study. He has a known cardiomyopathy with severe complex apnea noted on his recent sleep study. He underwent a titration study last month with a full face mask, and could not tolerate C Pap. He was changed to bilevel, and titrated as high as 18/14 with persistent mixed and obstructive events.  However, he had significant mask leaks because of his large beard.  I have reviewed the study with him and his family in detail, and answered all of their questions.   Review of Systems  Constitutional: Negative for fever and unexpected weight change.  HENT: Negative for congestion, dental problem, ear pain, nosebleeds, postnasal drip, rhinorrhea, sinus pressure, sneezing, sore throat and trouble swallowing.   Eyes: Negative for redness and itching.  Respiratory: Negative for cough, chest tightness, shortness of breath and wheezing.   Cardiovascular: Negative for palpitations and leg swelling.  Gastrointestinal: Negative for nausea and vomiting.  Genitourinary: Negative for dysuria.  Musculoskeletal: Negative for joint swelling.  Skin: Negative for rash.  Neurological: Negative for headaches.  Hematological: Does not bruise/bleed easily.  Psychiatric/Behavioral: Negative for dysphoric mood. The patient is not nervous/anxious.        Objective:   Physical Exam Thin male in no acute distress Nose without purulence or discharge noted Neck without lymphadenopathy or thyromegaly Lower extremities without edema, no cyanosis Alert and oriented, moves all 4 extremities.       Assessment & Plan:

## 2015-03-05 ENCOUNTER — Telehealth: Payer: Self-pay | Admitting: Pulmonary Disease

## 2015-03-05 ENCOUNTER — Other Ambulatory Visit: Payer: Self-pay | Admitting: Pulmonary Disease

## 2015-03-05 DIAGNOSIS — G4731 Primary central sleep apnea: Secondary | ICD-10-CM

## 2015-03-05 NOTE — Telephone Encounter (Signed)
Order sent to pcc  Pcc, make sure he knows to f/u with Dr. Craige Cotta about 8 weeks after starting on his new machine.

## 2015-03-05 NOTE — Telephone Encounter (Signed)
Spoke with Elnita Maxwell (pt's niece), states that pt is wanting to proceed with BiPap therapy.   Pt is not associated with any homecare company at this time.  KC please advise on recs for bipap order.  Thanks!

## 2015-03-10 NOTE — Telephone Encounter (Signed)
Called and spoke to pt's EC, [place]. Informed her of the recs per [facility]. Pt has yet to hear from DME regarding BiPAP. Elnita Maxwell also stated she would wait to schedule appt with [facility] till pt has received BiPAP. Called [facility] and left VM for rep handling pt's case.

## 2015-03-11 NOTE — Telephone Encounter (Signed)
Spoke with Jasmine December at [employer].  She needs ov notes faxed from Dr clance and Dr Graciela Husbands.  Notes faxed.  Spoke with Elnita Maxwell (Pt's EC) and notified that info has been faxed and that per sharon they should have a decision from [facility] within 72 hours.

## 2015-03-18 ENCOUNTER — Other Ambulatory Visit: Payer: Self-pay | Admitting: Nurse Practitioner

## 2015-03-21 ENCOUNTER — Encounter: Payer: Self-pay | Admitting: *Deleted

## 2015-03-26 ENCOUNTER — Telehealth: Payer: Self-pay | Admitting: Pulmonary Disease

## 2015-03-26 NOTE — Telephone Encounter (Signed)
Spoke with Adam Gillespie and she said that she has attempted to send a Medical Necessity form twice but has not received it back yet.  Asked her to verify fax number and was given wrong fax number.  Gave her correct fax number and requested that she re-fax form.  Form will be faxed to front desk fax.  To Mindy for follow up

## 2015-03-28 NOTE — Telephone Encounter (Signed)
Synetta Fail, please advise if you have a CMN on this patient.  Thanks!

## 2015-03-28 NOTE — Telephone Encounter (Signed)
Nothing was ever received. Was this sent to anita since it sounds like it is a CMN?

## 2015-03-28 NOTE — Telephone Encounter (Signed)
Mindy, Medical Necessity form should have been faxed and placed in KC's folder at the front, did you receive this form?  Ok to close encounter?  Please advise.

## 2015-03-31 NOTE — Telephone Encounter (Signed)
Faxed all of Dr. Teddy Spike CMN's this am. I called and verified with Jasmine December from [facility] she stated she did get the signed CMN

## 2015-04-02 ENCOUNTER — Other Ambulatory Visit: Payer: Self-pay | Admitting: *Deleted

## 2015-04-02 ENCOUNTER — Ambulatory Visit (INDEPENDENT_AMBULATORY_CARE_PROVIDER_SITE_OTHER): Payer: Medicare Other | Admitting: *Deleted

## 2015-04-02 DIAGNOSIS — I5022 Chronic systolic (congestive) heart failure: Secondary | ICD-10-CM | POA: Diagnosis not present

## 2015-04-02 DIAGNOSIS — Z01810 Encounter for preprocedural cardiovascular examination: Secondary | ICD-10-CM

## 2015-04-02 DIAGNOSIS — I255 Ischemic cardiomyopathy: Secondary | ICD-10-CM | POA: Diagnosis not present

## 2015-04-02 NOTE — Progress Notes (Signed)
Remote ICD transmission.   

## 2015-04-03 ENCOUNTER — Ambulatory Visit
Admission: RE | Admit: 2015-04-03 | Discharge: 2015-04-03 | Disposition: A | Discharge status: Home / Self Care | Payer: Medicare Other | Source: Ambulatory Visit | Attending: Vascular Surgery | Admitting: Vascular Surgery

## 2015-04-03 ENCOUNTER — Other Ambulatory Visit: Payer: Self-pay | Admitting: Vascular Surgery

## 2015-04-03 DIAGNOSIS — I6523 Occlusion and stenosis of bilateral carotid arteries: Secondary | ICD-10-CM

## 2015-04-03 DIAGNOSIS — Z01810 Encounter for preprocedural cardiovascular examination: Secondary | ICD-10-CM

## 2015-04-03 MED ORDER — IOPAMIDOL (ISOVUE-370) INJECTION 76%
75.0000 mL | Freq: Once | INTRAVENOUS | Status: AC | PRN
  Administered 2015-04-03: 75 mL via INTRAVENOUS

## 2015-04-04 LAB — CUP PACEART REMOTE DEVICE CHECK
Battery Voltage: 3.07 V
Date Time Interrogation Session: 20160615143612
HIGH POWER IMPEDANCE MEASURED VALUE: 66 Ohm
Lead Channel Impedance Value: 342 Ohm
Lead Channel Impedance Value: 437 Ohm
Lead Channel Pacing Threshold Pulse Width: 0.4 ms
Lead Channel Sensing Intrinsic Amplitude: 4.25 mV
Lead Channel Setting Pacing Pulse Width: 0.4 ms
MDC IDC MSMT BATTERY REMAINING LONGEVITY: 135 mo
MDC IDC MSMT LEADCHNL RV PACING THRESHOLD AMPLITUDE: 0.875 V
MDC IDC MSMT LEADCHNL RV SENSING INTR AMPL: 4.25 mV
MDC IDC SET LEADCHNL RV PACING AMPLITUDE: 2.5 V
MDC IDC SET LEADCHNL RV SENSING SENSITIVITY: 0.3 mV
MDC IDC SET ZONE DETECTION INTERVAL: 350 ms
MDC IDC SET ZONE DETECTION INTERVAL: 450 ms
MDC IDC STAT BRADY RV PERCENT PACED: 0.01 %
Zone Setting Detection Interval: 320 ms

## 2015-04-10 ENCOUNTER — Encounter: Payer: Self-pay | Admitting: *Deleted

## 2015-04-16 ENCOUNTER — Encounter: Payer: Self-pay | Admitting: Cardiology

## 2015-04-18 ENCOUNTER — Encounter: Payer: Self-pay | Admitting: Vascular Surgery

## 2015-04-24 ENCOUNTER — Encounter: Payer: Self-pay | Admitting: Vascular Surgery

## 2015-04-24 ENCOUNTER — Ambulatory Visit (INDEPENDENT_AMBULATORY_CARE_PROVIDER_SITE_OTHER): Payer: Medicare Other | Admitting: Vascular Surgery

## 2015-04-24 VITALS — BP 114/65 | HR 82 | Ht 64.0 in | Wt 145.1 lb

## 2015-04-24 DIAGNOSIS — I6521 Occlusion and stenosis of right carotid artery: Secondary | ICD-10-CM | POA: Diagnosis not present

## 2015-04-24 MED ORDER — TICAGRELOR 60 MG PO TABS: 60.0000 mg | ORAL_TABLET | Freq: Two times a day (BID) | ORAL | Status: DC

## 2015-04-24 NOTE — Progress Notes (Signed)
History and Physical  History of Present Illness  Adam Gillespie is a 59 y.o. male  who presented with chief complaint: right carotid stenosis last seen on 02/13/2015. Previous carotid studies demonstrated: RICA 80-99% stenosis, LICA 60-79% stenosis. Patient has no history of TIA or stroke symptom. The patient has not had amaurosis fugax or monocular blindness. The patient has not had facial drooping or hemiplegia. The patient has not had receptive or expressive aphasia. The patient does have a chronic speech impediment at baseline and hearing difficulty. His niece assists him during questions during this history.   The patient has a history of CAD s/p CABG x 5 in 2001. He has an ICD which was last changed in October 2015. He has LV dysfunction with EF of 25% per ECHO in 2013. His cardiologists are Drs. Hochrin and Graciela Husbands. He had a low risk stress nuclear study back in October 2015. He has hyperlipidemia managed on a statin. He takes a daily aspirin. His hypertension is well managed.   The patient denies any chest pain or shortness of breath. Per his family however, the patient has dyspnea with minimal exertion. He denies any orthopnea or PND. He was recently diagnosed with sleep apnea and in the process of getting a CPAP. He is followed by Dr. Shelle Iron.  Due to his cardiac history we sent him for a CTA of the neck to see if he is a good carotid stent candidate.    Past Medical History  Diagnosis Date  . Anoxic encephalopathy     POSTOP FROM 2001  . LV dysfunction     EF is 25% per echo in July 2013  . Carotid stenosis, right     60-79% right, 40 - 59% left - needs dopplers in June 2014  . Hyperlipidemia   . Gout   . Hypothyroidism   . Ischemic cardiomyopathy     Remote MI with CABG x 5 in 2001; Myocardial perfusion imaging EF 26% old infarct anterior wall. 04/2010  . 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 Hearing deficit     Bilateral ears  . 4098 lead 05/01/2014  .  Automatic implantable cardiac defibrillator -Medtronic 04/23/2011    a. 07/2014 gen change -  Medtronic EVERA single chamber ICD, serial number  [id number] H.  . ACE inhibitor intolerance     Renal insufficiency/hyperkalemia  . Coronary artery disease   . Heart murmur   . CHF (congestive heart failure)   . Sleep apnea 11-2014    Past Surgical History  Procedure Laterality Date  . Cardiac catheterization  08/05/2000    THERE IS ANTERIOR AND APICAL AKINESIS. EF IS ESTIMATED 20%, WITH INFEROBASILAR PORTIONS CONTRACTING REASONABLY WELL.  Marland Kitchen Coronary artery bypass graft      x5. lima to the lad, saphenous vein graft to the intermediate and obtuse mariginal, and a saphenous vein graft to the acute mariginal and distal right coronary artery  . Cardiac defibrillator placement    . Colonoscopy with propofol N/A 01/09/2013    Procedure: COLONOSCOPY WITH PROPOFOL;  Surgeon: Shirley Friar, MD;  Location: [facility] ENDOSCOPY;  Service: Endoscopy;  Laterality: N/A;  . Esophagogastroduodenoscopy N/A 07/11/2013    Procedure: ESOPHAGOGASTRODUODENOSCOPY (EGD);  Surgeon: Shirley Friar, MD;  Location: [facility] ENDOSCOPY;  Service: Endoscopy;  Laterality: N/A;  . Savory dilation N/A 07/11/2013    Procedure: SAVORY DILATION;  Surgeon: Shirley Friar, MD;  Location: [facility] ENDOSCOPY;  Service: Endoscopy;  Laterality: N/A;  . Balloon dilation  N/A 07/11/2013    Procedure: BALLOON DILATION;  Surgeon: Shirley Friar, MD;  Location: [facility] ENDOSCOPY;  Service: Endoscopy;  Laterality: N/A;  . Esophageal dilation  2015  . Implantable cardioverter defibrillator (icd) generator change N/A 07/31/2014    Procedure: ICD GENERATOR CHANGE;  Surgeon: Duke Salvia, MD;  Location: [facility] CATH LAB;  Service: Cardiovascular;  Laterality: N/A;  . Lead revision N/A 07/31/2014    Procedure: LEAD REVISION;  Surgeon: Duke Salvia, MD;  Location: [facility] CATH LAB;  Service: Cardiovascular;  Laterality: N/A;  . Tonsillectomy    . Adenoidectomy        History   Social History  . Marital Status: Single    Spouse Name: N/A  . Number of Children: 0  . Years of Education: N/A   Occupational History  . retired    Social History Main Topics  . Smoking status: Never Smoker   . Smokeless tobacco: Current User    Types: Chew  . Alcohol Use: No  . Drug Use: No  . Sexual Activity: No   Other Topics Concern  . Not on file   Social History Narrative    Family History  Problem Relation Age of Onset  . Heart attack Mother   . Cancer Father   . Cancer Brother   . Asthma Sister   . Emphysema Father   . Emphysema Sister   . Heart attack Maternal Grandfather   . Stroke Maternal Grandmother     Current Outpatient Prescriptions on File Prior to Visit  Medication Sig Dispense Refill  . allopurinol (ZYLOPRIM) 100 MG tablet TAKE ONE TABLET BY MOUTH ONCE DAILY 30 tablet 6  . aspirin 81 MG tablet Take 81 mg by mouth daily.      . furosemide (LASIX) 20 MG tablet Take 20 mg by mouth 3 (three) times a week. Tuesday, Thursday, Saturday    . hydrALAZINE (APRESOLINE) 25 MG tablet Take 0.5 tablets (12.5 mg total) by mouth 2 (two) times daily. 90 tablet 1  . isosorbide mononitrate (IMDUR) 30 MG 24 hr tablet Take 1 tablet (30 mg total) by mouth daily. 90 tablet 1  . levothyroxine (SYNTHROID, LEVOTHROID) 125 MCG tablet Take 1 tablet (125 mcg total) by mouth daily. 30 tablet 11  . metoprolol succinate (TOPROL-XL) 50 MG 24 hr tablet Take 1 tablet (50 mg total) by mouth daily. 30 tablet 5  . omeprazole (PRILOSEC) 40 MG capsule Take 40 mg by mouth daily.    Marland Kitchen oxybutynin (DITROPAN-XL) 10 MG 24 hr tablet Take 10 mg by mouth daily.    . simvastatin (ZOCOR) 40 MG tablet Take 1 tablet (40 mg total) by mouth at bedtime. 90 tablet 1   No current facility-administered medications on file prior to visit.    Allergies  Allergen Reactions  . Ace Inhibitors     Contributed to hyperkalemia and renal insufficiency in the past (see office note 10/03/2012)     Review of Systems: As listed above, otherwise negative.  Physical Examination  Filed Vitals:   04/24/15 1409 04/24/15 1411  BP: 102/66 114/65  Pulse: 82   Height: 5\' 4"  (1.626 m)   Weight: 145 lb 1.6 oz (65.817 kg)   SpO2: 96%     General: A&O x 3, WDWN  Pulmonary: Sym exp, good air movt, CTAB, no rales, rhonchi, & wheezing  Cardiac: RRR, Nl S1, S2, no Murmurs, rubs or gallops  Gastrointestinal: soft, NTND, -G/R, - HSM, - masses, - CVAT B*  Musculoskeletal: M/S  5/5 throughout , Extremities without ischemic changes   Vascular: Palpable femoral pulses bilateral   CTA : > 80% right proximal carotid stenosis with acceptable arch for possible carotid stent placement.  Medical Decision Making  Adam Gillespie is a 59 y.o. male who presents with: The patient has a greater than 80% right internal carotid artery stenosis. He has minimal cardiac reserve due to prior severe myocardial infarction. He did have a negative stress test proximally 6 months ago. However he becomes short of breath after walking about 25 yards.   The CTA was reviewed with Dr. Darrick Penna and he thinks due to the cardiac history as well as new C-pap for COPD we will plan a carotid stent placement in the near future.  Thomasena Edis, Fey [place] [facility] PA-C [facility]  The patient was seen in conjunction with Dr. Darrick Penna. 04/24/2015, 2:50 PM  History and exam details as above. I feel patient is high risk for open carotid endarterectomy due to his underlying cardiac and pulmonary dysfunction. I believe he would be better served with carotid stenting. Risks benefits possible complications and procedure details were discussed with the patient and his family today. These include but are not limited to bleeding infection stroke risk of 3-5%. I understand the advantages of carotid stenting from a cardiac perspective. If at the time of attempted carotid stenting we are unable to do the stent  procedure then we would reconsider whether or not to do carotid endarterectomy. The patient's arch does appear acceptable for carotid stenting. There is some calcification at the carotid bifurcation but this is not appear to be circumferential. The patient was started on Brilinta today in addition to his aspirin. He was not placed on Plavix since he is on omeprazole and interaction between these 2 medications. He will be scheduled for carotid stent in the near future.  Fabienne Bruns, MD [facility] [place] Office: [phone] Pager: [phone]

## 2015-04-25 ENCOUNTER — Other Ambulatory Visit: Payer: Self-pay

## 2015-04-28 ENCOUNTER — Encounter: Payer: Self-pay | Admitting: Internal Medicine

## 2015-05-21 ENCOUNTER — Encounter (HOSPITAL_COMMUNITY)
Admission: AD | Disposition: A | Discharge status: Home / Self Care | Payer: Self-pay | Source: Ambulatory Visit | Attending: Vascular Surgery

## 2015-05-21 ENCOUNTER — Encounter (HOSPITAL_COMMUNITY): Payer: Self-pay | Admitting: *Deleted

## 2015-05-21 ENCOUNTER — Inpatient Hospital Stay (HOSPITAL_COMMUNITY)
Admission: AD | Admit: 2015-05-21 | Discharge: 2015-05-23 | DRG: 036 | Disposition: A | Discharge status: Home / Self Care | Payer: Medicare Other | Source: Ambulatory Visit | Attending: Vascular Surgery | Admitting: Vascular Surgery

## 2015-05-21 DIAGNOSIS — M109 Gout, unspecified: Secondary | ICD-10-CM | POA: Diagnosis present

## 2015-05-21 DIAGNOSIS — R031 Nonspecific low blood-pressure reading: Secondary | ICD-10-CM | POA: Diagnosis not present

## 2015-05-21 DIAGNOSIS — E039 Hypothyroidism, unspecified: Secondary | ICD-10-CM | POA: Diagnosis present

## 2015-05-21 DIAGNOSIS — I509 Heart failure, unspecified: Secondary | ICD-10-CM | POA: Diagnosis present

## 2015-05-21 DIAGNOSIS — Z79899 Other long term (current) drug therapy: Secondary | ICD-10-CM | POA: Diagnosis not present

## 2015-05-21 DIAGNOSIS — Z7982 Long term (current) use of aspirin: Secondary | ICD-10-CM | POA: Diagnosis not present

## 2015-05-21 DIAGNOSIS — I10 Essential (primary) hypertension: Secondary | ICD-10-CM | POA: Diagnosis present

## 2015-05-21 DIAGNOSIS — I2510 Atherosclerotic heart disease of native coronary artery without angina pectoris: Secondary | ICD-10-CM | POA: Diagnosis present

## 2015-05-21 DIAGNOSIS — E785 Hyperlipidemia, unspecified: Secondary | ICD-10-CM | POA: Diagnosis present

## 2015-05-21 DIAGNOSIS — Z95810 Presence of automatic (implantable) cardiac defibrillator: Secondary | ICD-10-CM

## 2015-05-21 DIAGNOSIS — I252 Old myocardial infarction: Secondary | ICD-10-CM

## 2015-05-21 DIAGNOSIS — R001 Bradycardia, unspecified: Secondary | ICD-10-CM | POA: Diagnosis not present

## 2015-05-21 DIAGNOSIS — H9193 Unspecified hearing loss, bilateral: Secondary | ICD-10-CM | POA: Diagnosis present

## 2015-05-21 DIAGNOSIS — G4730 Sleep apnea, unspecified: Secondary | ICD-10-CM | POA: Diagnosis present

## 2015-05-21 DIAGNOSIS — I6523 Occlusion and stenosis of bilateral carotid arteries: Principal | ICD-10-CM | POA: Diagnosis present

## 2015-05-21 DIAGNOSIS — R0902 Hypoxemia: Secondary | ICD-10-CM | POA: Diagnosis not present

## 2015-05-21 DIAGNOSIS — Z951 Presence of aortocoronary bypass graft: Secondary | ICD-10-CM | POA: Diagnosis not present

## 2015-05-21 DIAGNOSIS — J449 Chronic obstructive pulmonary disease, unspecified: Secondary | ICD-10-CM | POA: Diagnosis present

## 2015-05-21 DIAGNOSIS — I6521 Occlusion and stenosis of right carotid artery: Secondary | ICD-10-CM | POA: Diagnosis not present

## 2015-05-21 DIAGNOSIS — I255 Ischemic cardiomyopathy: Secondary | ICD-10-CM | POA: Diagnosis present

## 2015-05-21 DIAGNOSIS — I6529 Occlusion and stenosis of unspecified carotid artery: Secondary | ICD-10-CM | POA: Diagnosis present

## 2015-05-21 HISTORY — PX: PERIPHERAL VASCULAR CATHETERIZATION: SHX172C

## 2015-05-21 LAB — BLOOD GAS, ARTERIAL
Acid-base deficit: 3.1 mmol/L — ABNORMAL HIGH (ref 0.0–2.0)
Bicarbonate: 20.8 mEq/L (ref 20.0–24.0)
DRAWN BY: 312971
FIO2: 100
O2 Saturation: 99.1 %
PCO2 ART: 34 mmHg — AB (ref 35.0–45.0)
Patient temperature: 98.6
TCO2: 21.9 mmol/L (ref 0–100)
pH, Arterial: 7.404 (ref 7.350–7.450)
pO2, Arterial: 361 mmHg — ABNORMAL HIGH (ref 80.0–100.0)

## 2015-05-21 LAB — CBC
HCT: 42.3 % (ref 39.0–52.0)
Hemoglobin: 14.7 g/dL (ref 13.0–17.0)
MCH: 33.6 pg (ref 26.0–34.0)
MCHC: 34.8 g/dL (ref 30.0–36.0)
MCV: 96.8 fL (ref 78.0–100.0)
PLATELETS: 321 10*3/uL (ref 150–400)
RBC: 4.37 MIL/uL (ref 4.22–5.81)
RDW: 14.6 % (ref 11.5–15.5)
WBC: 14.4 10*3/uL — AB (ref 4.0–10.5)

## 2015-05-21 LAB — MRSA PCR SCREENING: MRSA BY PCR: NEGATIVE

## 2015-05-21 LAB — POCT I-STAT, CHEM 8
BUN: 26 mg/dL — ABNORMAL HIGH (ref 6–20)
CALCIUM ION: 1.17 mmol/L (ref 1.12–1.23)
CHLORIDE: 104 mmol/L (ref 101–111)
CREATININE: 1.5 mg/dL — AB (ref 0.61–1.24)
Glucose, Bld: 90 mg/dL (ref 65–99)
HEMATOCRIT: 50 % (ref 39.0–52.0)
Hemoglobin: 17 g/dL (ref 13.0–17.0)
Potassium: 4.2 mmol/L (ref 3.5–5.1)
Sodium: 141 mmol/L (ref 135–145)
TCO2: 24 mmol/L (ref 0–100)

## 2015-05-21 LAB — BASIC METABOLIC PANEL
ANION GAP: 13 (ref 5–15)
BUN: 27 mg/dL — AB (ref 6–20)
CO2: 17 mmol/L — AB (ref 22–32)
CREATININE: 1.89 mg/dL — AB (ref 0.61–1.24)
Calcium: 8.8 mg/dL — ABNORMAL LOW (ref 8.9–10.3)
Chloride: 107 mmol/L (ref 101–111)
GFR calc Af Amer: 44 mL/min — ABNORMAL LOW (ref >60)
GFR calc non Af Amer: 38 mL/min — ABNORMAL LOW (ref >60)
Glucose, Bld: 85 mg/dL (ref 65–99)
Potassium: 5.2 mmol/L — ABNORMAL HIGH (ref 3.5–5.1)
Sodium: 137 mmol/L (ref 135–145)

## 2015-05-21 LAB — TROPONIN I

## 2015-05-21 LAB — MAGNESIUM: Magnesium: 2.2 mg/dL (ref 1.7–2.4)

## 2015-05-21 LAB — POCT ACTIVATED CLOTTING TIME: Activated Clotting Time: 472 seconds

## 2015-05-21 SURGERY — CAROTID PTA/STENT INTERVENTION
Anesthesia: LOCAL

## 2015-05-21 MED ORDER — ACETAMINOPHEN 650 MG RE SUPP: 325.0000 mg | RECTAL | Status: DC | PRN

## 2015-05-21 MED ORDER — SIMVASTATIN 40 MG PO TABS
40.0000 mg | ORAL_TABLET | Freq: Every day | ORAL | Status: DC
  Administered 2015-05-21 – 2015-05-22 (×2): 40 mg via ORAL
  Filled 2015-05-21 (×3): qty 1

## 2015-05-21 MED ORDER — METOPROLOL SUCCINATE ER 50 MG PO TB24
50.0000 mg | ORAL_TABLET | Freq: Every day | ORAL | Status: DC
  Filled 2015-05-21 (×2): qty 1

## 2015-05-21 MED ORDER — LABETALOL HCL 5 MG/ML IV SOLN: 10.0000 mg | INTRAVENOUS | Status: DC | PRN

## 2015-05-21 MED ORDER — BIVALIRUDIN 250 MG IV SOLR
INTRAVENOUS | Status: AC
  Filled 2015-05-21: qty 250

## 2015-05-21 MED ORDER — POTASSIUM CHLORIDE CRYS ER 20 MEQ PO TBCR: 20.0000 meq | EXTENDED_RELEASE_TABLET | Freq: Every day | ORAL | Status: DC | PRN

## 2015-05-21 MED ORDER — ASPIRIN 81 MG PO TABS: 81.0000 mg | ORAL_TABLET | Freq: Every day | ORAL | Status: DC

## 2015-05-21 MED ORDER — BIVALIRUDIN BOLUS VIA INFUSION - CUPID
INTRAVENOUS | Status: DC | PRN
  Administered 2015-05-21: 51 mg via INTRAVENOUS

## 2015-05-21 MED ORDER — ISOSORBIDE MONONITRATE ER 30 MG PO TB24
30.0000 mg | ORAL_TABLET | Freq: Every day | ORAL | Status: DC
  Filled 2015-05-21 (×2): qty 1

## 2015-05-21 MED ORDER — MORPHINE SULFATE 2 MG/ML IJ SOLN: 2.0000 mg | INTRAMUSCULAR | Status: DC | PRN

## 2015-05-21 MED ORDER — SODIUM CHLORIDE 0.45 % IV SOLN
INTRAVENOUS | Status: DC
  Administered 2015-05-21 – 2015-05-22 (×2): via INTRAVENOUS

## 2015-05-21 MED ORDER — LIDOCAINE HCL (PF) 1 % IJ SOLN
INTRAMUSCULAR | Status: AC
  Filled 2015-05-21: qty 30

## 2015-05-21 MED ORDER — SODIUM CHLORIDE 0.9 % IV SOLN
INTRAVENOUS | Status: DC
  Administered 2015-05-21: 08:00:00 via INTRAVENOUS

## 2015-05-21 MED ORDER — TICAGRELOR 60 MG PO TABS
60.0000 mg | ORAL_TABLET | Freq: Two times a day (BID) | ORAL | Status: DC
  Administered 2015-05-21 – 2015-05-23 (×4): 60 mg via ORAL
  Filled 2015-05-21 (×5): qty 1

## 2015-05-21 MED ORDER — SODIUM CHLORIDE 0.9 % IV SOLN
250.0000 mg | INTRAVENOUS | Status: DC | PRN
  Administered 2015-05-21: 1.75 mg/kg/h via INTRAVENOUS

## 2015-05-21 MED ORDER — ONDANSETRON HCL 4 MG/2ML IJ SOLN: 4.0000 mg | Freq: Four times a day (QID) | INTRAMUSCULAR | Status: DC | PRN

## 2015-05-21 MED ORDER — PANTOPRAZOLE SODIUM 40 MG PO TBEC
40.0000 mg | DELAYED_RELEASE_TABLET | Freq: Every day | ORAL | Status: DC
  Administered 2015-05-21 – 2015-05-23 (×3): 40 mg via ORAL
  Filled 2015-05-21 (×3): qty 1

## 2015-05-21 MED ORDER — ASPIRIN 81 MG PO CHEW
CHEWABLE_TABLET | ORAL | Status: AC
  Administered 2015-05-21: 81 mg via ORAL
  Filled 2015-05-21: qty 1

## 2015-05-21 MED ORDER — IODIXANOL 320 MG/ML IV SOLN
INTRAVENOUS | Status: DC | PRN
  Administered 2015-05-21: 130 mL via INTRA_ARTERIAL

## 2015-05-21 MED ORDER — HYDRALAZINE HCL 25 MG PO TABS
12.5000 mg | ORAL_TABLET | Freq: Two times a day (BID) | ORAL | Status: DC
  Filled 2015-05-21 (×5): qty 0.5

## 2015-05-21 MED ORDER — ALUM & MAG HYDROXIDE-SIMETH 200-200-20 MG/5ML PO SUSP: 15.0000 mL | ORAL | Status: DC | PRN

## 2015-05-21 MED ORDER — LEVOTHYROXINE SODIUM 125 MCG PO TABS
125.0000 ug | ORAL_TABLET | Freq: Every day | ORAL | Status: DC
  Administered 2015-05-22 – 2015-05-23 (×2): 125 ug via ORAL
  Filled 2015-05-21 (×3): qty 1

## 2015-05-21 MED ORDER — FUROSEMIDE 20 MG PO TABS
20.0000 mg | ORAL_TABLET | ORAL | Status: DC
  Administered 2015-05-22: 20 mg via ORAL
  Filled 2015-05-21: qty 1

## 2015-05-21 MED ORDER — ASPIRIN 81 MG PO CHEW
81.0000 mg | CHEWABLE_TABLET | Freq: Once | ORAL | Status: AC
  Administered 2015-05-21: 81 mg via ORAL

## 2015-05-21 MED ORDER — NITROGLYCERIN 1 MG/10 ML FOR IR/CATH LAB
INTRA_ARTERIAL | Status: DC | PRN
  Administered 2015-05-21: 10:00:00

## 2015-05-21 MED ORDER — NOREPINEPHRINE BITARTRATE 1 MG/ML IV SOLN
INTRAVENOUS | Status: AC
  Filled 2015-05-21: qty 4

## 2015-05-21 MED ORDER — MAGNESIUM SULFATE 2 GM/50ML IV SOLN: 2.0000 g | Freq: Every day | INTRAVENOUS | Status: DC | PRN

## 2015-05-21 MED ORDER — METOPROLOL TARTRATE 1 MG/ML IV SOLN: 2.0000 mg | INTRAVENOUS | Status: DC | PRN

## 2015-05-21 MED ORDER — HEPARIN (PORCINE) IN NACL 2-0.9 UNIT/ML-% IJ SOLN
INTRAMUSCULAR | Status: AC
  Filled 2015-05-21: qty 1000

## 2015-05-21 MED ORDER — ATROPINE SULFATE 0.1 MG/ML IJ SOLN
INTRAMUSCULAR | Status: DC | PRN
  Administered 2015-05-21: 0.5 mg via INTRAVENOUS

## 2015-05-21 MED ORDER — PANTOPRAZOLE SODIUM 40 MG PO TBEC: 40.0000 mg | DELAYED_RELEASE_TABLET | Freq: Every day | ORAL | Status: DC

## 2015-05-21 MED ORDER — DOPAMINE-DEXTROSE 3.2-5 MG/ML-% IV SOLN
INTRAVENOUS | Status: DC | PRN
  Administered 2015-05-21: 5 ug/kg/min via INTRAVENOUS

## 2015-05-21 MED ORDER — LIDOCAINE HCL (PF) 1 % IJ SOLN
INTRAMUSCULAR | Status: DC | PRN
  Administered 2015-05-21: 15 mL

## 2015-05-21 MED ORDER — OXYBUTYNIN CHLORIDE ER 10 MG PO TB24
10.0000 mg | ORAL_TABLET | Freq: Every day | ORAL | Status: DC
  Administered 2015-05-21 – 2015-05-23 (×3): 10 mg via ORAL
  Filled 2015-05-21 (×3): qty 1

## 2015-05-21 MED ORDER — ALLOPURINOL 100 MG PO TABS
100.0000 mg | ORAL_TABLET | Freq: Every day | ORAL | Status: DC
  Administered 2015-05-21 – 2015-05-23 (×3): 100 mg via ORAL
  Filled 2015-05-21 (×3): qty 1

## 2015-05-21 MED ORDER — ACETAMINOPHEN 325 MG PO TABS
325.0000 mg | ORAL_TABLET | ORAL | Status: DC | PRN
  Administered 2015-05-22: 650 mg via ORAL
  Filled 2015-05-21: qty 2

## 2015-05-21 MED ORDER — DOCUSATE SODIUM 100 MG PO CAPS
100.0000 mg | ORAL_CAPSULE | Freq: Every day | ORAL | Status: DC
  Administered 2015-05-22 – 2015-05-23 (×2): 100 mg via ORAL
  Filled 2015-05-21 (×2): qty 1

## 2015-05-21 MED ORDER — ASPIRIN 81 MG PO CHEW
81.0000 mg | CHEWABLE_TABLET | Freq: Every day | ORAL | Status: DC
  Administered 2015-05-21 – 2015-05-23 (×3): 81 mg via ORAL
  Filled 2015-05-21 (×3): qty 1

## 2015-05-21 MED ORDER — HYDRALAZINE HCL 20 MG/ML IJ SOLN: 5.0000 mg | INTRAMUSCULAR | Status: DC | PRN

## 2015-05-21 MED ORDER — DOPAMINE-DEXTROSE 3.2-5 MG/ML-% IV SOLN
10.0000 ug/kg/min | INTRAVENOUS | Status: DC
  Administered 2015-05-21 – 2015-05-22 (×2): 10 ug/kg/min via INTRAVENOUS
  Filled 2015-05-21: qty 250

## 2015-05-21 MED ORDER — ATROPINE SULFATE 0.1 MG/ML IJ SOLN
INTRAMUSCULAR | Status: AC
  Filled 2015-05-21: qty 10

## 2015-05-21 MED ORDER — CETYLPYRIDINIUM CHLORIDE 0.05 % MT LIQD
7.0000 mL | Freq: Two times a day (BID) | OROMUCOSAL | Status: DC
  Administered 2015-05-21 – 2015-05-23 (×4): 7 mL via OROMUCOSAL

## 2015-05-21 SURGICAL SUPPLY — 19 items
BALLN EMERGE MR 3.0X20 (BALLOONS) ×2
BALLN VIATRAC 5X20X135 (BALLOONS) ×2
BALLOON EMERGE MR 3.0X20 (BALLOONS) ×1 IMPLANT
BALLOON VIATRAC 5X20X135 (BALLOONS) IMPLANT
CATH ANGIO 5F PIGTAIL 100CM (CATHETERS) ×1 IMPLANT
CATH HEADHUNTER H1 5F 100CM (CATHETERS) ×1 IMPLANT
COVER PRB 48X5XTLSCP FOLD TPE (BAG) ×1 IMPLANT
COVER PROBE 5X48 (BAG) ×2
DEVICE EMBOSHIELD NAV6 4.0-7.0 (WIRE) ×1 IMPLANT
KIT ENCORE 26 ADVANTAGE (KITS) ×1 IMPLANT
KIT PV (KITS) ×2 IMPLANT
SHEATH PINNACLE 6F 10CM (SHEATH) ×1 IMPLANT
SHEATH SHUTTLE SELECT 6F (SHEATH) ×1 IMPLANT
STENT XACT CAR 9-7X30X136 (Permanent Stent) ×1 IMPLANT
SYR MEDRAD MARK V 150ML (SYRINGE) ×2 IMPLANT
TRANSDUCER W/STOPCOCK (MISCELLANEOUS) ×2 IMPLANT
TRAY PV CATH (CUSTOM PROCEDURE TRAY) ×2 IMPLANT
WIRE AMPLATZ SSTIFF .035X260CM (WIRE) ×2 IMPLANT
WIRE HITORQ VERSACORE ST 145CM (WIRE) ×1 IMPLANT

## 2015-05-21 NOTE — H&P (View-Only) (Signed)
History and Physical  History of Present Illness  Adam Gillespie is a 59 y.o. male  who presented with chief complaint: right carotid stenosis last seen on 02/13/2015. Previous carotid studies demonstrated: RICA 80-99% stenosis, LICA 60-79% stenosis. Patient has no history of TIA or stroke symptom. The patient has not had amaurosis fugax or monocular blindness. The patient has not had facial drooping or hemiplegia. The patient has not had receptive or expressive aphasia. The patient does have a chronic speech impediment at baseline and hearing difficulty. His niece assists him during questions during this history.   The patient has a history of CAD s/p CABG x 5 in 2001. He has an ICD which was last changed in October 2015. He has LV dysfunction with EF of 25% per ECHO in 2013. His cardiologists are Drs. Hochrin and Graciela Husbands. He had a low risk stress nuclear study back in October 2015. He has hyperlipidemia managed on a statin. He takes a daily aspirin. His hypertension is well managed.   The patient denies any chest pain or shortness of breath. Per his family however, the patient has dyspnea with minimal exertion. He denies any orthopnea or PND. He was recently diagnosed with sleep apnea and in the process of getting a CPAP. He is followed by Dr. Shelle Iron.  Due to his cardiac history we sent him for a CTA of the neck to see if he is a good carotid stent candidate.    Past Medical History  Diagnosis Date  . Anoxic encephalopathy     POSTOP FROM 2001  . LV dysfunction     EF is 25% per echo in July 2013  . Carotid stenosis, right     60-79% right, 40 - 59% left - needs dopplers in June 2014  . Hyperlipidemia   . Gout   . Hypothyroidism   . Ischemic cardiomyopathy     Remote MI with CABG x 5 in 2001; Myocardial perfusion imaging EF 26% old infarct anterior wall. 04/2010  . 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 Hearing deficit     Bilateral ears  . 4098 lead 05/01/2014  .  Automatic implantable cardiac defibrillator -Medtronic 04/23/2011    a. 07/2014 gen change -  Medtronic EVERA single chamber ICD, serial number  [id number] H.  . ACE inhibitor intolerance     Renal insufficiency/hyperkalemia  . Coronary artery disease   . Heart murmur   . CHF (congestive heart failure)   . Sleep apnea 11-2014    Past Surgical History  Procedure Laterality Date  . Cardiac catheterization  08/05/2000    THERE IS ANTERIOR AND APICAL AKINESIS. EF IS ESTIMATED 20%, WITH INFEROBASILAR PORTIONS CONTRACTING REASONABLY WELL.  Marland Kitchen Coronary artery bypass graft      x5. lima to the lad, saphenous vein graft to the intermediate and obtuse mariginal, and a saphenous vein graft to the acute mariginal and distal right coronary artery  . Cardiac defibrillator placement    . Colonoscopy with propofol N/A 01/09/2013    Procedure: COLONOSCOPY WITH PROPOFOL;  Surgeon: Shirley Friar, MD;  Location: [facility] ENDOSCOPY;  Service: Endoscopy;  Laterality: N/A;  . Esophagogastroduodenoscopy N/A 07/11/2013    Procedure: ESOPHAGOGASTRODUODENOSCOPY (EGD);  Surgeon: Shirley Friar, MD;  Location: [facility] ENDOSCOPY;  Service: Endoscopy;  Laterality: N/A;  . Savory dilation N/A 07/11/2013    Procedure: SAVORY DILATION;  Surgeon: Shirley Friar, MD;  Location: [facility] ENDOSCOPY;  Service: Endoscopy;  Laterality: N/A;  . Balloon dilation  N/A 07/11/2013    Procedure: BALLOON DILATION;  Surgeon: Shirley Friar, MD;  Location: [facility] ENDOSCOPY;  Service: Endoscopy;  Laterality: N/A;  . Esophageal dilation  2015  . Implantable cardioverter defibrillator (icd) generator change N/A 07/31/2014    Procedure: ICD GENERATOR CHANGE;  Surgeon: Duke Salvia, MD;  Location: [facility] CATH LAB;  Service: Cardiovascular;  Laterality: N/A;  . Lead revision N/A 07/31/2014    Procedure: LEAD REVISION;  Surgeon: Duke Salvia, MD;  Location: [facility] CATH LAB;  Service: Cardiovascular;  Laterality: N/A;  . Tonsillectomy    . Adenoidectomy        History   Social History  . Marital Status: Single    Spouse Name: N/A  . Number of Children: 0  . Years of Education: N/A   Occupational History  . retired    Social History Main Topics  . Smoking status: Never Smoker   . Smokeless tobacco: Current User    Types: Chew  . Alcohol Use: No  . Drug Use: No  . Sexual Activity: No   Other Topics Concern  . Not on file   Social History Narrative    Family History  Problem Relation Age of Onset  . Heart attack Mother   . Cancer Father   . Cancer Brother   . Asthma Sister   . Emphysema Father   . Emphysema Sister   . Heart attack Maternal Grandfather   . Stroke Maternal Grandmother     Current Outpatient Prescriptions on File Prior to Visit  Medication Sig Dispense Refill  . allopurinol (ZYLOPRIM) 100 MG tablet TAKE ONE TABLET BY MOUTH ONCE DAILY 30 tablet 6  . aspirin 81 MG tablet Take 81 mg by mouth daily.      . furosemide (LASIX) 20 MG tablet Take 20 mg by mouth 3 (three) times a week. Tuesday, Thursday, Saturday    . hydrALAZINE (APRESOLINE) 25 MG tablet Take 0.5 tablets (12.5 mg total) by mouth 2 (two) times daily. 90 tablet 1  . isosorbide mononitrate (IMDUR) 30 MG 24 hr tablet Take 1 tablet (30 mg total) by mouth daily. 90 tablet 1  . levothyroxine (SYNTHROID, LEVOTHROID) 125 MCG tablet Take 1 tablet (125 mcg total) by mouth daily. 30 tablet 11  . metoprolol succinate (TOPROL-XL) 50 MG 24 hr tablet Take 1 tablet (50 mg total) by mouth daily. 30 tablet 5  . omeprazole (PRILOSEC) 40 MG capsule Take 40 mg by mouth daily.    Marland Kitchen oxybutynin (DITROPAN-XL) 10 MG 24 hr tablet Take 10 mg by mouth daily.    . simvastatin (ZOCOR) 40 MG tablet Take 1 tablet (40 mg total) by mouth at bedtime. 90 tablet 1   No current facility-administered medications on file prior to visit.    Allergies  Allergen Reactions  . Ace Inhibitors     Contributed to hyperkalemia and renal insufficiency in the past (see office note 10/03/2012)     Review of Systems: As listed above, otherwise negative.  Physical Examination  Filed Vitals:   04/24/15 1409 04/24/15 1411  BP: 102/66 114/65  Pulse: 82   Height: 5\' 4"  (1.626 m)   Weight: 145 lb 1.6 oz (65.817 kg)   SpO2: 96%     General: A&O x 3, WDWN  Pulmonary: Sym exp, good air movt, CTAB, no rales, rhonchi, & wheezing  Cardiac: RRR, Nl S1, S2, no Murmurs, rubs or gallops  Gastrointestinal: soft, NTND, -G/R, - HSM, - masses, - CVAT B*  Musculoskeletal: M/S  5/5 throughout , Extremities without ischemic changes   Vascular: Palpable femoral pulses bilateral   CTA : > 80% right proximal carotid stenosis with acceptable arch for possible carotid stent placement.  Medical Decision Making  DIOVANNI HOOFNAGLE is a 59 y.o. male who presents with: The patient has a greater than 80% right internal carotid artery stenosis. He has minimal cardiac reserve due to prior severe myocardial infarction. He did have a negative stress test proximally 6 months ago. However he becomes short of breath after walking about 25 yards.   The CTA was reviewed with Dr. Darrick Penna and he thinks due to the cardiac history as well as new C-pap for COPD we will plan a carotid stent placement in the near future.  Thomasena Edis, EMMA [facility] PA-C [facility]  The patient was seen in conjunction with Dr. Darrick Penna. 04/24/2015, 2:50 PM  History and exam details as above. I feel patient is high risk for open carotid endarterectomy due to his underlying cardiac and pulmonary dysfunction. I believe he would be better served with carotid stenting. Risks benefits possible complications and procedure details were discussed with the patient and his family today. These include but are not limited to bleeding infection stroke risk of 3-5%. I understand the advantages of carotid stenting from a cardiac perspective. If at the time of attempted carotid stenting we are unable to do the stent  procedure then we would reconsider whether or not to do carotid endarterectomy. The patient's arch does appear acceptable for carotid stenting. There is some calcification at the carotid bifurcation but this is not appear to be circumferential. The patient was started on Brilinta today in addition to his aspirin. He was not placed on Plavix since he is on omeprazole and interaction between these 2 medications. He will be scheduled for carotid stent in the near future.  Fabienne Bruns, MD [facility] [place] Office: [phone] Pager: [phone]

## 2015-05-21 NOTE — Progress Notes (Signed)
UR COMPLETED  

## 2015-05-21 NOTE — Progress Notes (Signed)
Site area: RFA Site Prior to Removal:  Level 0 Pressure Applied For: Manual:   yes Patient Status During Pull: stable   Post Pull Site:  Level 0 Post Pull Instructions Given: yes  Post Pull Pulses Present: yes rt dp palpable Dressing Applied:  yes Bedrest begins @ 12:25 Comments: small bruising about 2-73mm of bruising at sheath entry site. Patient tolerated sheath pull well. Sandi Eddins held manual pressure for 

## 2015-05-21 NOTE — Progress Notes (Signed)
[facility] [place]  Subjective  - Called to see pt for transient hypotension.     Objective 120/68 67 98.7 F (37.1 C) (Oral) 15 98% No intake or output data in the 24 hours ending 05/21/15 1651  Right groin no hematoma, no back pain, no abdominal pain Pt currently on 7.5 mcg dopamine BP 130s  Pt was apparently transiently with BP in 40s systolic with some mental status changes.  This rapidly improved with transient O2 and 15 mcg dopamine for a few minutes.  Assessment/Planning: Transient hypotension no obvious symptoms of hematoma or RP bleed.  Most likely still baroreceptor irritation.  Will continue dopamine if requires  More titration of drip will transfer to 2S.    Fabienne Bruns 05/21/2015 4:51 PM --  Laboratory Lab Results:  Recent Labs  05/21/15 0745  HGB 17.0  HCT 50.0   BMET  Recent Labs  05/21/15 0745  NA 141  K 4.2  CL 104  GLUCOSE 90  BUN 26*  CREATININE 1.50*    COAG Lab Results  Component Value Date   INR 1.05 02/14/2013   No results found for: PTT

## 2015-05-21 NOTE — Progress Notes (Signed)
Was called into patient room by fellow nurse. Patient had SBP of 30s. Sats dropped to the 60s while on the 2L.Patient was unresponsive, pale, clammy. E-link notified. Dr. Darrick Penna paged. Orthoptist at bedside. Manual BP taken and was 70/44. Episode lasted about 3 minutes. Per E link- increase dopamine. Dr. Kendrick Fries gave new orders. Patient placed on NRB. Patient subsequently became alert. Orientation was x 4. Orpha Bur, NP with CCM saw patient. Dr. Darrick Penna saw patient. ABG collected by RT. Labs collected.

## 2015-05-21 NOTE — Interval H&P Note (Signed)
History and Physical Interval Note:  05/21/2015 7:43 AM  Adam Gillespie  has presented today for surgery, with the diagnosis of right carotid steonosis  The various methods of treatment have been discussed with the patient and family. After consideration of risks, benefits and other options for treatment, the patient has consented to  Procedure(s): Carotid PTA/Stent Intervention (N/A) as a surgical intervention .  The patient's history has been reviewed, patient examined, no change in status, stable for surgery.  I have reviewed the patient's chart and labs.  Questions were answered to the patient's satisfaction.     Fabienne Bruns

## 2015-05-21 NOTE — Progress Notes (Addendum)
SBP 60's and 70's.  Head of bed lowered,feet elevated with resulting BP 99/47. Asymptomatic and neuro parameters intact. 118/38 next BP. Will keep pt in slight T-berg position for now.

## 2015-05-21 NOTE — Progress Notes (Signed)
eLink Physician-Brief Progress Note Patient Name: Adam Gillespie DOB: [birth year] MRN: [id number]   Date of Service  05/21/2015  HPI/Events of Note  New arrival from OR from elective CEA today; BP was labile in OR; on arrival to 3300 on dopamine he is hypotensive, bradycardic to 40's, hypoxemic.  eICU Interventions  Increase dopamine Manual BP Cbc, bmet, 12 lead, troponin Bedside NP to see     Intervention Category Major Interventions: Arrhythmia - evaluation and management  MCQUAID, DOUGLAS 05/21/2015, 4:37 PM

## 2015-05-21 NOTE — Progress Notes (Signed)
Small heme spot noted on R femoral dressing less than half the diameter of a dime.pressre to site x 5 min. Site remains soft to palpation and non raised. Pt seems more alert and responds quicker than earlier.

## 2015-05-22 ENCOUNTER — Encounter (HOSPITAL_COMMUNITY): Payer: Self-pay | Admitting: Vascular Surgery

## 2015-05-22 LAB — TROPONIN I
Troponin I: <0.03 ng/mL (ref <0.031)
Troponin I: 0.03 ng/mL (ref <0.031)

## 2015-05-22 NOTE — Progress Notes (Signed)
  [facility] Progress Note  Subjective  - POD #1  Doing ok. Denies any pain. His family will be here later today.   Objective Filed Vitals:   05/22/15 0700  BP: 106/69  Pulse: 50  Temp:   Resp: 12  Tmax: 98.7 BP sys 30s-180s 02 100% 2L   Intake/Output Summary (Last 24 hours) at 05/22/15 0733 Last data filed at 05/22/15 0443  Gross per 24 hour  Intake 750.05 ml  Output   1150 ml  Net -399.95 ml   Neuro exam intact. Speech and hearing difficulties at baseline.  Right groin no hematoma; Small amount of sanguinous drainage on dressing.  5/5 upper, 4/5 right lower extremity, 5/5 left lower  Assessment/Planning: 59 y.o. male is s/p: right carotid stenting 1 Day Post-Op   Placed on dopamine yesterday for transient hypotension. SBP 100s this am on 10 mcg dopamine. Wean as tolerated. Troponins negative. Required NRB yesterday. Now 100% on 2L [place]. He does have significant pulmonary dysfunction at baseline.  Scr elevated at 1.89 today. Continue gentle hydration. Encourage fluid intake.  ?restart Brilinta Neuro intact. Ambulate in halls.  Keep in 3S today.  Likely d/c tomorrow if able to wean off dopamine.   Raymond Gurney 05/22/2015 7:33 AM --   Neuro intact.  No hematoma Slowly weaning dopamine Agree with VERY Gentle hydration in light of underlying cardiac dysfunction.  Encourage PO hydration Ambulate Brilinta and ASA restarted yesterday  Agree with above  Fabienne Bruns, MD [facility] [place] Office: [phone] Pager: [phone]  Laboratory CBC    Component Value Date/Time   WBC 14.4* 05/21/2015 1736   HGB 14.7 05/21/2015 1736   HCT 42.3 05/21/2015 1736   PLT 321 05/21/2015 1736    BMET    Component Value Date/Time   NA 137 05/21/2015 1736   K 5.2* 05/21/2015 1736   CL 107 05/21/2015 1736   CO2 17* 05/21/2015 1736   GLUCOSE 85 05/21/2015 1736   BUN 27* 05/21/2015 1736   CREATININE 1.89* 05/21/2015  1736   CALCIUM 8.8* 05/21/2015 1736   GFRNONAA 38* 05/21/2015 1736   GFRAA 44* 05/21/2015 1736    COAG Lab Results  Component Value Date   INR 1.05 02/14/2013   No results found for: PTT  Antibiotics Anti-infectives    None       Maris Berger, PA-C [facility] Office: [phone] Pager: [phone] 05/22/2015 7:33 AM

## 2015-05-23 ENCOUNTER — Telehealth: Payer: Self-pay | Admitting: Nurse Practitioner

## 2015-05-23 ENCOUNTER — Telehealth: Payer: Self-pay | Admitting: Vascular Surgery

## 2015-05-23 ENCOUNTER — Other Ambulatory Visit: Payer: Self-pay

## 2015-05-23 DIAGNOSIS — Z48812 Encounter for surgical aftercare following surgery on the circulatory system: Secondary | ICD-10-CM

## 2015-05-23 DIAGNOSIS — I6521 Occlusion and stenosis of right carotid artery: Secondary | ICD-10-CM

## 2015-05-23 LAB — BASIC METABOLIC PANEL
Anion gap: 8 (ref 5–15)
BUN: 29 mg/dL — AB (ref 6–20)
CHLORIDE: 110 mmol/L (ref 101–111)
CO2: 20 mmol/L — ABNORMAL LOW (ref 22–32)
Calcium: 8.4 mg/dL — ABNORMAL LOW (ref 8.9–10.3)
Creatinine, Ser: 1.63 mg/dL — ABNORMAL HIGH (ref 0.61–1.24)
GFR calc Af Amer: 52 mL/min — ABNORMAL LOW (ref >60)
GFR calc non Af Amer: 45 mL/min — ABNORMAL LOW (ref >60)
Glucose, Bld: 91 mg/dL (ref 65–99)
POTASSIUM: 4.6 mmol/L (ref 3.5–5.1)
Sodium: 138 mmol/L (ref 135–145)

## 2015-05-23 NOTE — Progress Notes (Signed)
Pt d/c home per MD order, pt VSS, pt family at Bs, all questions answered, Pt and family verbalized understanding of d/c all questions answered

## 2015-05-23 NOTE — Telephone Encounter (Signed)
-----   Message from Phillips Odor, RN sent at 05/23/2015  9:37 AM EDT ----- Regarding: Joyce Gross log; also needs 4 wk. f/u with Carotid Duplex & OV w/ CEF   ----- Message -----    From: Raymond Gurney, PA-C    Sent: 05/23/2015   7:30 AM      To: Vvs Charge Pool  S/p right carotid stenting 05/21/15  F/u in 4 weeks with Dr. Darrick Penna with carotid duplex  Thanks Selena Batten

## 2015-05-23 NOTE — Progress Notes (Signed)
  [facility] Progress Note  Subjective  - POD #2  No complaints.   Objective Filed Vitals:   05/23/15 0600  BP: 111/54  Pulse: 56  Temp:   Resp: 23  02 94% RA   Intake/Output Summary (Last 24 hours) at 05/23/15 0723 Last data filed at 05/23/15 0400  Gross per 24 hour  Intake 1374.75 ml  Output   1950 ml  Net -575.25 ml   Right groin soft with no hematoma. Moving all extremities equally, tongue midline, no facial droop.  Feet warm bilaterally  Assessment/Planning: 59 y.o. male is s/p: right carotid stenting 2 Days Post-Op   Neuro intact. Groin without hematoma. BP stable. Close to being off dopamine. Will turn off this am.  Elevated Scr at baseline: Creatinine trending down.  Needs to ambulate. Rankin score: 0 VQI: On ASA, statin and brilinta. Plan d/c home later today.   Raymond Gurney 05/23/2015 7:23 AM --  Laboratory CBC    Component Value Date/Time   WBC 14.4* 05/21/2015 1736   HGB 14.7 05/21/2015 1736   HCT 42.3 05/21/2015 1736   PLT 321 05/21/2015 1736    BMET    Component Value Date/Time   NA 138 05/23/2015 0254   K 4.6 05/23/2015 0254   CL 110 05/23/2015 0254   CO2 20* 05/23/2015 0254   GLUCOSE 91 05/23/2015 0254   BUN 29* 05/23/2015 0254   CREATININE 1.63* 05/23/2015 0254   CALCIUM 8.4* 05/23/2015 0254   GFRNONAA 45* 05/23/2015 0254   GFRAA 52* 05/23/2015 0254    COAG Lab Results  Component Value Date   INR 1.05 02/14/2013   No results found for: PTT  Antibiotics Anti-infectives    None       Maris Berger, PA-C [facility] Office: [phone] Pager: [phone] 05/23/2015 7:23 AM

## 2015-05-23 NOTE — Telephone Encounter (Signed)
Talked to [place] his POA. Has had his carotid stent.  Looks like BP was low during this admission - required dopamine.  Currently not taking Imdur, Metoprolol or Hydralazine.  BP this afternoon was 100/60.  She says they were instructed to contact me for further instructions.  Will hold these medicines over the weekend. She will continue to check his BP. We will call to get an update on Monday afternoon.  [name], RN, ANP-C [facility] [address] [place], [place]  [address] [phone]

## 2015-05-23 NOTE — Care Management Note (Signed)
Case Management Note  Patient Details  Name: Adam Gillespie MRN: [id number] Date of Birth: September 19, 1956  Subjective/Objective:                  Assessment/Planning:  s/p right carotid stenting  Action/Plan: Return to home when medically stable.   Expected Discharge Date:  05/22/15               Expected Discharge Plan:  Home/Self Care  In-House Referral:     Discharge planning Services  CM Consult  Post Acute Care Choice:    Choice offered to:     DME Arranged:    DME Agency:     HH Arranged:    HH Agency:     Status of Service:  Completed, signed off  Medicare Important Message Given:    Date Medicare IM Given:    Medicare IM give by:    Date Additional Medicare IM Given:    Additional Medicare Important Message give by:     If discussed at Long Length of Stay Meetings, dates discussed:    Additional Comments:  Epifanio Lesches, RN 05/23/2015, 5:03 PM

## 2015-05-23 NOTE — Telephone Encounter (Signed)
New Message    Cheryl pt POA is wanting to tell you that he has been in   And she has questions on medications that Hospital RN, are asking to be held.  Pt is doing well but she wants to speak to Rn about what medications to give.

## 2015-05-23 NOTE — Telephone Encounter (Signed)
LM for pt re appt, dpm °

## 2015-05-26 NOTE — Telephone Encounter (Signed)
S/W POA is agreeable to plan, pt feels fine, no dizziness noted.

## 2015-05-26 NOTE — Telephone Encounter (Signed)
S/w POA to get bp readings from this weekend stated one day was 90/50 and another day was 106/62, POA could not remember anymore bp readings. Will Adam Gillespie.

## 2015-05-26 NOTE — Telephone Encounter (Signed)
Let's monitor for this week Call on Friday with update For now, do not restart medicines.

## 2015-05-27 NOTE — Discharge Summary (Signed)
[facility] Discharge Summary  Adam Gillespie 08-22-1956 59 y.o. male  [id number]  Admission Date: 05/21/2015  Discharge Date: 05/23/2015  Physician: Fabienne Bruns, MD  Admission Diagnosis: right carotid steonosis  HPI:   This is a 59 y.o. male who presented with chief complaint: right carotid stenosis last seen on 02/13/2015. Previous carotid studies demonstrated: RICA 80-99% stenosis, LICA 60-79% stenosis. Patient has no history of TIA or stroke symptom. The patient has not had amaurosis fugax or monocular blindness. The patient has not had facial drooping or hemiplegia. The patient has not had receptive or expressive aphasia. The patient does have a chronic speech impediment at baseline and hearing difficulty. His niece assists him during questions during this history.   The patient has a history of CAD s/p CABG x 5 in 2001. He has an ICD which was last changed in October 2015. He has LV dysfunction with EF of 25% per ECHO in 2013. His cardiologists are Drs. Hochrin and Graciela Husbands. He had a low risk stress nuclear study back in October 2015. He has hyperlipidemia managed on a statin. He takes a daily aspirin. His hypertension is well managed.   The patient denies any chest pain or shortness of breath. Per his family however, the patient has dyspnea with minimal exertion. He denies any orthopnea or PND. He was recently diagnosed with sleep apnea and in the process of getting a CPAP. He is followed by Dr. Shelle Iron. Due to his cardiac history we sent him for a CTA of the neck to see if he is a good carotid stent candidate.   Hospital Course:  The patient was admitted to the hospital and taken to the [facility] lab on 05/21/2015 and underwent arch and right carotid angiogram with right carotid angioplasty and stenting.  The patient tolerated the procedure well and was transported to the PACU in stable condition.  That afternoon, he had transient hypotension with blood pressure in  the 40s systolic with mental status changes. This was rapidly improved with transient 02 and 15 mcg for a few minutes. He was then transferred to the stepdown unit. His oxygen saturation rates were in the 60s on 2L and he was placed on a non-rebreather mask. He continued to be hypotensive requiring dopamine. His brilinta and ASA were restarted  By POD 1, the patient's neuro status was intact. His right groin was without hematoma. He was still requiring dopamine for blood pressure support. He was off the NRB and on 2L [place]. His creatinine was slightly elevated above his baseline at 1.89. He was gently hydrated.  POD 1: He was weaned off dopamine. His neuro exam continued to be intact and he was ambulating well in the halls. His creatinine had trended down. He was discharged home on POD 2 in good condition.   Discharge Instructions:   The patient is discharged to home with extensive instructions on wound care and progressive ambulation.  They are instructed not to drive or perform any heavy lifting until returning to see the physician in his office.  Discharge Instructions    CAROTID [place]: Call MD for difficulty swallowing or speaking; weakness in arms or legs that is a new symtom; severe headache.  If you have increased swelling in the neck and/or  are having difficulty breathing, CALL 911    Complete by:  As directed      Call MD for:  redness, tenderness, or signs of infection (pain, swelling, bleeding, redness, odor or green/yellow discharge around incision  site)    Complete by:  As directed      Call MD for:  severe or increased pain, loss or decreased feeling  in affected limb(s)    Complete by:  As directed      Call MD for:  temperature >100.5    Complete by:  As directed      Discharge wound care:    Complete by:  As directed   Wash right groin with soap and water daily. You do not need to apply a dressing there.     Driving Restrictions    Complete by:  As directed   No driving for 2  weeks     Increase activity slowly    Complete by:  As directed   Walk with assistance use walker or cane as needed     Lifting restrictions    Complete by:  As directed   No lifting for 2 weeks     Resume previous diet    Complete by:  As directed            Discharge Diagnosis:  right carotid steonosis  Secondary Diagnosis: Patient Active Problem List   Diagnosis Date Noted  . Carotid stenosis, asymptomatic 05/21/2015  . 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 Implantable cardioverter-defibrillator lead failure 08/01/2014  . Automatic implantable cardioverter-defibrillator in situ 08/01/2014  . Complete heart block-intermittent 08/01/2014  . IVCD (intraventricular conduction defect) 08/01/2014  . ACE inhibitor intolerance   . 7829 Lead 05/01/2014  . Dysphagia 07/11/2013  . CAP (community acquired pneumonia) 02/14/2013  . Chest pain 02/14/2013  . Special screening for malignant neoplasms, colon 01/09/2013  . CAD (coronary artery disease) 10/01/2011  . Chronic systolic congestive heart failure 04/23/2011  . Automatic implantable cardiac defibrillator -Medtronic 04/23/2011  . Ischemic cardiomyopathy 03/30/2011  . Carotid artery bruit 03/30/2011  . Hyperlipemia 03/30/2011  . Hypothyroidism 03/30/2011  . Gout 03/30/2011   Past Medical History  Diagnosis Date  . Anoxic encephalopathy     POSTOP FROM 2001  . LV dysfunction     EF is 25% per echo in July 2013  . Carotid stenosis, right     60-79% right, 40 - 59% left - needs dopplers in June 2014  . Hyperlipidemia   . Gout   . Hypothyroidism   . Ischemic cardiomyopathy     Remote MI with CABG x 5 in 2001; Myocardial perfusion imaging EF 26% old infarct anterior wall. 04/2010  . 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 Hearing deficit     Bilateral ears  . 5621 lead 05/01/2014  . Automatic implantable cardiac defibrillator -Medtronic 04/23/2011    a. 07/2014 gen change -  Medtronic EVERA single chamber ICD,  serial number  [id number].  . ACE inhibitor intolerance     Renal insufficiency/hyperkalemia  . Coronary artery disease   . Heart murmur   . CHF (congestive heart failure)   . Sleep apnea 11-2014      Medication List    TAKE these medications        allopurinol 100 MG tablet  Commonly known as:  ZYLOPRIM  TAKE ONE TABLET BY MOUTH ONCE DAILY     aspirin 81 MG tablet  Take 81 mg by mouth daily.     furosemide 20 MG tablet  Commonly known as:  LASIX  Take 20 mg by mouth 3 (three) times a week. Tuesday, Thursday, Saturday     hydrALAZINE 25 MG tablet  Commonly known as:  APRESOLINE  Take 0.5 tablets (12.5 mg total) by mouth 2 (two) times daily.     isosorbide mononitrate 30 MG 24 hr tablet  Commonly known as:  IMDUR  Take 1 tablet (30 mg total) by mouth daily.     levothyroxine 125 MCG tablet  Commonly known as:  SYNTHROID, LEVOTHROID  Take 1 tablet (125 mcg total) by mouth daily.     metoprolol succinate 50 MG 24 hr tablet  Commonly known as:  TOPROL-XL  Take 1 tablet (50 mg total) by mouth daily.     omeprazole 40 MG capsule  Commonly known as:  PRILOSEC  Take 40 mg by mouth daily.     oxybutynin 10 MG 24 hr tablet  Commonly known as:  DITROPAN-XL  Take 10 mg by mouth daily.     simvastatin 40 MG tablet  Commonly known as:  ZOCOR  Take 1 tablet (40 mg total) by mouth at bedtime.     ticagrelor 60 MG Tabs tablet  Commonly known as:  BRILINTA  Take 1 tablet (60 mg total) by mouth 2 (two) times daily.        Disposition: home  Patient's condition: is Good  Follow up: 1. Dr.  Darrick Penna in 4 weeks with carotid duplex.   Maris Berger, PA-C [facility] [phone]  --- For [facility] use --- Instructions: Press F2 to tab through selections.  Delete question if not applicable.   Modified Rankin score at D/C (0-6): 0  IV medication needed for:  1. Hypertension: No 2. Hypotension: Yes  Post-op Complications: No  1. Post-op CVA  or TIA: No  2. CN injury: No  3. Myocardial infarction: No  4.  CHF: No  5.  Dysrhythmia (new): No  6. Wound infection: No  7. Reperfusion symptoms: No  8. Return to OR: No  Discharge medications: Statin use:  Yes If No: [ ]  For Medical reasons, [ ]  Non-compliant, [ ]  Not-indicated ASA use:  Yes  If No: [ ]  For Medical reasons, [ ]  Non-compliant, [ ]  Not-indicated Beta blocker use:  Yes If No: [ ]  For Medical reasons, [ ]  Non-compliant, [ ]  Not-indicated ACE-Inhibitor use:  No If No: [ ]  For Medical reasons, [ ]  Non-compliant, [x ] Not-indicated P2Y12 Antagonist use: Yes, [ ]  Plavix, [ ]  Plasugrel, [ ]  Ticlopinine, [x ] Ticagrelor, [ ]  Other, [ ]  No for medical reason, [ ]  Non-compliant, [ ]  Not-indicated Anti-coagulant use:  No, [ ]  Warfarin, [ ]  Rivaroxaban, [ ]  Dabigatran, [ ]  Other, [ ]  No for medical reason, [ ]  Non-compliant, [x]  Not-indicated

## 2015-05-30 NOTE — Telephone Encounter (Signed)
Was following up on bp readings, will wait for return phone call.

## 2015-06-04 ENCOUNTER — Other Ambulatory Visit: Payer: Self-pay | Admitting: Internal Medicine

## 2015-06-18 ENCOUNTER — Encounter: Payer: Self-pay | Admitting: Vascular Surgery

## 2015-06-19 ENCOUNTER — Encounter: Payer: Medicare Other | Admitting: Vascular Surgery

## 2015-06-19 ENCOUNTER — Encounter (HOSPITAL_COMMUNITY): Payer: Medicare Other

## 2015-06-24 ENCOUNTER — Encounter: Payer: Self-pay | Admitting: Vascular Surgery

## 2015-06-24 NOTE — Consult Note (Signed)
NAMEOLLIVANDER, LADUKE               ACCOUNT NO.:  [id number]  MEDICAL RECORD NO.:  [id number]  LOCATION:  3S14C                        FACILITY:  [facility]  PHYSICIAN:  Nysia Dell K. Tylena Prisk, M.D.DATE OF BIRTH:  1955-12-27  DATE OF CONSULTATION:  06/20/2015 DATE OF DISCHARGE:  05/23/2015                                CONSULTATION   EXAMINATION:  Intracranial interpretation of right common carotid arteriogram before and after placement of a stent proximally in the right internal carotid artery.  FINDINGS:  The pre-stent placement of right common carotid arteriogram demonstrates the right internal carotid artery in its distal cervical segment to be normal.  There is approximately 50% narrowing of the horizontal petrous segment.  Vessel distal to this in the petrous cavernous and supraclinoid segments is widely patent.  The right middle and the right anterior cerebral arteries are opacified normally into the capillary and venous phases.  The post stent placement common carotid arteriogram demonstrates no change in the right internal carotid artery petrous segment stenosis.  Intracranially, no evidence of occlusion, stenosis, or intraluminal filling defects were seen.  IMPRESSION:  Pre and post stent placement common carotid arteriogram demonstrating wide patency of the anterior circulation as described.          ______________________________ Grandville Silos Corliss Skains, M.D.     SKD/MEDQ  D:  06/20/2015  T:  06/20/2015  Job:  [id number]

## 2015-06-25 ENCOUNTER — Other Ambulatory Visit: Payer: Self-pay | Admitting: *Deleted

## 2015-06-25 ENCOUNTER — Ambulatory Visit (HOSPITAL_COMMUNITY)
Admission: RE | Admit: 2015-06-25 | Discharge: 2015-06-25 | Disposition: A | Discharge status: Home / Self Care | Payer: Medicare Other | Source: Ambulatory Visit | Attending: Vascular Surgery | Admitting: Vascular Surgery

## 2015-06-25 ENCOUNTER — Telehealth: Payer: Self-pay | Admitting: *Deleted

## 2015-06-25 ENCOUNTER — Ambulatory Visit (INDEPENDENT_AMBULATORY_CARE_PROVIDER_SITE_OTHER): Payer: Self-pay | Admitting: Vascular Surgery

## 2015-06-25 ENCOUNTER — Encounter: Payer: Self-pay | Admitting: Vascular Surgery

## 2015-06-25 VITALS — BP 123/78 | HR 90 | Temp 98.9°F | Resp 16 | Ht 64.0 in | Wt 142.0 lb

## 2015-06-25 DIAGNOSIS — I6521 Occlusion and stenosis of right carotid artery: Secondary | ICD-10-CM | POA: Insufficient documentation

## 2015-06-25 DIAGNOSIS — E785 Hyperlipidemia, unspecified: Secondary | ICD-10-CM | POA: Insufficient documentation

## 2015-06-25 DIAGNOSIS — Z48812 Encounter for surgical aftercare following surgery on the circulatory system: Secondary | ICD-10-CM | POA: Diagnosis not present

## 2015-06-25 MED ORDER — METOPROLOL SUCCINATE ER 25 MG PO TB24: 25.0000 mg | ORAL_TABLET | Freq: Every day | ORAL | Status: DC

## 2015-06-25 NOTE — Telephone Encounter (Signed)
S/w pt's niece per DPR and will call back today with bp readings

## 2015-06-25 NOTE — Telephone Encounter (Signed)
I would like to see a current list of his medicines.

## 2015-06-25 NOTE — Telephone Encounter (Signed)
Pt medication list is up to date.  toprol xl ( 25 mg ) daily restarted today till pt is seen back this month for ov per [name], NP.

## 2015-06-25 NOTE — Telephone Encounter (Signed)
S/w niece Ms.  Smith bp readings: 127/67  9/7 120/60  9/6 123/60  9/5 134/79  9/4 pt just got finished working in the yard 119/68  9/3 108/58  9/2 Will FYI [name], NP, and will leave a message on Ms. Smith voicemail per pt's DPR of any medication changes.

## 2015-06-25 NOTE — Telephone Encounter (Signed)
lvm need to know what medications patient is taking

## 2015-06-25 NOTE — Progress Notes (Signed)
[facility] [place] HISTORY AND PHYSICAL    History of Present Illness:  Patient is a 59 y.o. year old male who presents for post-operative follow-up after right carotid stent.  Denies headaches, numbness, tingling or other neuro deficits.  He denies any stroke-type symptoms. He is on blunt and aspirin.  Physical Examination  Filed Vitals:   06/25/15 1327  BP: 123/78  Pulse: 90  Temp: 98.9 F (37.2 C)  Resp: 16    Body mass index is 24.36 kg/(m^2).  General:  Alert and oriented, no acute distress Neck: No bruit or JVD Skin: No rash Extremities:  2+ femoral pulses no mass right groin Neurologic: Upper and lower extremity motor 5/5 and symmetric  DATA: Patient had a carotid duplex scan today which showed external carotid artery stenosis on the left but no significant internal carotid artery stenosis, patent right internal carotid artery stent with no residual stenosis   ASSESSMENT: Doing well status post right carotid stent. Blood pressure medications still being adjusted by [name] FNP   PLAN:  Continue Brilinta and aspirin. Follow-up carotid duplex 6 months   Fabienne Bruns, MD [facility] [place] Office: [phone] Pager: [phone]

## 2015-06-25 NOTE — Addendum Note (Signed)
Addended by: Adria Dill L on: 06/25/2015 04:25 PM   Modules accepted: Orders

## 2015-07-04 ENCOUNTER — Ambulatory Visit: Payer: Medicare Other | Admitting: Nurse Practitioner

## 2015-07-15 ENCOUNTER — Other Ambulatory Visit: Payer: Self-pay | Admitting: Nurse Practitioner

## 2015-07-18 ENCOUNTER — Ambulatory Visit (INDEPENDENT_AMBULATORY_CARE_PROVIDER_SITE_OTHER): Payer: Medicare Other | Admitting: Nurse Practitioner

## 2015-07-18 ENCOUNTER — Encounter: Payer: Self-pay | Admitting: Nurse Practitioner

## 2015-07-18 VITALS — BP 120/74 | HR 80 | Ht 65.0 in | Wt 138.8 lb

## 2015-07-18 DIAGNOSIS — E785 Hyperlipidemia, unspecified: Secondary | ICD-10-CM

## 2015-07-18 DIAGNOSIS — Z95810 Presence of automatic (implantable) cardiac defibrillator: Secondary | ICD-10-CM

## 2015-07-18 DIAGNOSIS — I255 Ischemic cardiomyopathy: Secondary | ICD-10-CM

## 2015-07-18 MED ORDER — HYDRALAZINE HCL 25 MG PO TABS: 12.5000 mg | ORAL_TABLET | Freq: Two times a day (BID) | ORAL | Status: DC

## 2015-07-18 MED ORDER — FUROSEMIDE 20 MG PO TABS: 20.0000 mg | ORAL_TABLET | ORAL | Status: DC

## 2015-07-18 MED ORDER — ISOSORBIDE MONONITRATE ER 30 MG PO TB24: 15.0000 mg | ORAL_TABLET | Freq: Every day | ORAL | Status: DC

## 2015-07-18 NOTE — Progress Notes (Signed)
CARDIOLOGY OFFICE NOTE  Date:  07/18/2015    [name] Date of Birth: 01/31/1956 Medical Record #[id number]  PCP:  [name]  Cardiologist:  Hochrein/Klein    Chief Complaint  Patient presents with  . Cardiomyopathy    Follow up visit/post hospital visit - seen for [name] & Dr. Antoine Poche    History of Present Illness: Adam Gillespie is a 59 y.o. male who presents today for a follow up visit. Seen for Dr. Antoine Poche and [name]. Amaar has a hx of CAD, status post CABG in 2001, ischemic cardiomyopathy with an EF of 25%, status post AICD, hypothyroidism, HL, GERD, CKD, gout, hypothyroidism. 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 He has a tendency towards hyperkalemia and is not on an ACE inhibitor. Myoview 07/2014 low risk.   Last seen by [name], PA - had progressive carotid disease on the right and was referred to [facility]. Now s/p stenting of the right carotid. He is on aspirin and Brilinta. Complicated by hypotension. Most of his cardiac medicines were stopped. BP has remained relatively soft.   Comes back today. He is here with his niece who is his power of attorney and his mother. Remains off of Hydralazine and Imdur. Only on half dose of Toprol. Taking Lasix three times a week. He is doing well. He enjoys working in the garden with his flowers. He weed eats but does not use the mower. No chest pain. Breathing is ok. Not dizzy or lightheaded. BP running around 120 - sometimes lower and sometimes higher.   Past Medical History  Diagnosis Date  . Anoxic encephalopathy     POSTOP FROM 2001  . LV dysfunction     EF is 25% per echo in July 2013  . Carotid stenosis, right     60-79% right, 40 - 59% left - needs dopplers in June 2014  . Hyperlipidemia   . Gout   . Hypothyroidism   . Ischemic cardiomyopathy     Remote MI with CABG x 5 in 2001; Myocardial perfusion imaging EF 26% old infarct anterior wall. 04/2010  . Non Q wave  myocardial infarction 07/2000  . Hearing deficit     Bilateral ears  . 4098 lead 05/01/2014  . Automatic implantable cardiac defibrillator -Medtronic 04/23/2011    a. 07/2014 gen change -  Medtronic EVERA single chamber ICD, serial number  [id number] H.  . ACE inhibitor intolerance     Renal insufficiency/hyperkalemia  . Coronary artery disease   . Heart murmur   . CHF (congestive heart failure)   . Sleep apnea 11-2014    Past Surgical History  Procedure Laterality Date  . Cardiac catheterization  08/05/2000    THERE IS ANTERIOR AND APICAL AKINESIS. EF IS ESTIMATED 20%, WITH INFEROBASILAR PORTIONS CONTRACTING REASONABLY WELL.  Marland Kitchen Coronary artery bypass graft      x5. lima to the lad, saphenous vein graft to the intermediate and obtuse mariginal, and a saphenous vein graft to the acute mariginal and distal right coronary artery  . Cardiac defibrillator placement    . Colonoscopy with propofol N/A 01/09/2013    Procedure: COLONOSCOPY WITH PROPOFOL;  Surgeon: Shirley Friar, MD;  Location: [facility] ENDOSCOPY;  Service: Endoscopy;  Laterality: N/A;  . Esophagogastroduodenoscopy N/A 07/11/2013    Procedure: ESOPHAGOGASTRODUODENOSCOPY (EGD);  Surgeon: Shirley Friar, MD;  Location: [facility] ENDOSCOPY;  Service: Endoscopy;  Laterality: N/A;  . Savory dilation N/A 07/11/2013    Procedure: SAVORY DILATION;  Surgeon: Shirley Friar, MD;  Location: [facility] ENDOSCOPY;  Service: Endoscopy;  Laterality: N/A;  . Balloon dilation N/A 07/11/2013    Procedure: BALLOON DILATION;  Surgeon: Shirley Friar, MD;  Location: [facility] ENDOSCOPY;  Service: Endoscopy;  Laterality: N/A;  . Esophageal dilation  2015  . Implantable cardioverter defibrillator (icd) generator change N/A 07/31/2014    Procedure: ICD GENERATOR CHANGE;  Surgeon: Duke Salvia, MD;  Location: [facility] CATH LAB;  Service: Cardiovascular;  Laterality: N/A;  . Lead revision N/A 07/31/2014    Procedure: LEAD REVISION;  Surgeon: Duke Salvia, MD;  Location:  [facility] CATH LAB;  Service: Cardiovascular;  Laterality: N/A;  . Tonsillectomy    . Adenoidectomy    . Peripheral vascular catheterization N/A 05/21/2015    Procedure: Carotid PTA/Stent Intervention;  Surgeon: Sherren Kerns, MD;  Location: [facility] INVASIVE CV LAB;  Service: Cardiovascular;  Laterality: N/A;     Medications: Current Outpatient Prescriptions  Medication Sig Dispense Refill  . allopurinol (ZYLOPRIM) 100 MG tablet TAKE ONE TABLET BY MOUTH ONCE DAILY 30 tablet 6  . aspirin 81 MG tablet Take 81 mg by mouth daily.      . furosemide (LASIX) 20 MG tablet Take 1 tablet (20 mg total) by mouth 3 (three) times a week. Tuesday, Thursday, Saturday 30 tablet 11  . levothyroxine (SYNTHROID, LEVOTHROID) 125 MCG tablet Take 1 tablet (125 mcg total) by mouth daily. 30 tablet 11  . metoprolol succinate (TOPROL-XL) 25 MG 24 hr tablet Take 12.5 mg by mouth daily.    Marland Kitchen omeprazole (PRILOSEC) 40 MG capsule Take 40 mg by mouth daily.    Marland Kitchen oxybutynin (DITROPAN-XL) 10 MG 24 hr tablet Take 10 mg by mouth daily.    . simvastatin (ZOCOR) 40 MG tablet TAKE ONE TABLET BY MOUTH AT BEDTIME 90 tablet 0  . ticagrelor (BRILINTA) 60 MG TABS tablet Take 1 tablet (60 mg total) by mouth 2 (two) times daily. 60 tablet 3  . hydrALAZINE (APRESOLINE) 25 MG tablet Take 0.5 tablets (12.5 mg total) by mouth 2 (two) times daily. 90 tablet 3  . isosorbide mononitrate (IMDUR) 30 MG 24 hr tablet Take 0.5 tablets (15 mg total) by mouth daily. 90 tablet 3   No current facility-administered medications for this visit.    Allergies: Allergies  Allergen Reactions  . Ace Inhibitors     Contributed to hyperkalemia and renal insufficiency in the past (see office note 10/03/2012)    Social History: The patient  reports that he has never smoked. His smokeless tobacco use includes Chew. He reports that he does not drink alcohol or use illicit drugs.   Family History: The patient's family history includes Asthma in his sister; Cancer in  his brother and father; Emphysema in his father and sister; Heart attack in his maternal grandfather and mother; Stroke in his maternal grandmother.   Review of Systems: Please see the history of present illness.   Otherwise, the review of systems is positive for none.   All other systems are reviewed and negative.   Physical Exam: VS:  BP 120/74 mmHg  Pulse 80  Ht 5\' 5"  (1.651 m)  Wt 138 lb 12.8 oz (62.959 kg)  BMI 23.10 kg/m2  SpO2 91% .  BMI Body mass index is 23.1 kg/(m^2).  Wt Readings from Last 3 Encounters:  07/18/15 138 lb 12.8 oz (62.959 kg)  06/25/15 142 lb (64.411 kg)  05/21/15 138 lb 14.2 oz (63 kg)    General: Pleasant. Well developed,  well nourished and in no acute distress.  HEENT: Normal. He is a little hard of hearing.  Neck: Supple, no JVD, carotid bruits, or masses noted.  Cardiac: Regular rate and rhythm. Heart tones are distant. No murmurs, rubs, or gallops. No edema.  Respiratory:  Lungs are clear to auscultation bilaterally with normal work of breathing.  GI: Soft and nontender.  MS: No deformity or atrophy. Gait and ROM intact. Skin: Warm and dry. Color is normal.  Neuro:  Strength and sensation are intact and no gross focal deficits noted.  Psych: Alert, appropriate and with normal affect.   LABORATORY DATA:  EKG:  EKG is not ordered today.  Lab Results  Component Value Date   WBC 14.4* 05/21/2015   HGB 14.7 05/21/2015   HCT 42.3 05/21/2015   PLT 321 05/21/2015   GLUCOSE 91 05/23/2015   CHOL 142 06/03/2014   TRIG 210.0* 06/03/2014   HDL 32.50* 06/03/2014   LDLDIRECT 87.5 06/03/2014   LDLCALC 70 11/01/2011   ALT 17 06/03/2014   AST 20 06/03/2014   NA 138 05/23/2015   K 4.6 05/23/2015   CL 110 05/23/2015   CREATININE 1.63* 05/23/2015   BUN 29* 05/23/2015   CO2 20* 05/23/2015   TSH 1.00 12/03/2013   INR 1.05 02/14/2013   HGBA1C 5.8* 02/15/2013    BNP (last 3 results) No results for input(s): BNP in the last 8760 hours.  ProBNP (last  3 results) No results for input(s): PROBNP in the last 8760 hours.   Other Studies Reviewed Today: Myoview 07/2014 Low risk stress nuclear study .  There is evidence of a previous anteriorlateral MI that is old. No ischemia LV Ejection Fraction: 29%. LV Wall Motion: akinesis / severe hypokinesis of the anterior lateral wall.  Carotid US 8/15 Bilateral ICA 60-79% >50% LECA FU 6 mos  Echo 7/13 - EF 25% with akinesis of theinferior,inferoseptal, distal anterior and apical walls; hypookinesis elsewhere. . - Aortic valve: Mild regurgitation. - Mitral valve: Mild regurgitation.  Assessment/Plan:  Coronary artery disease involving native coronary artery of native heart without angina pectoris No angina. Would manage medically.  Ischemic cardiomyopathy He is not a candidate for ACE inhibitor given prior history of hyperkalemia. Restarting Hydralazine and Imdur today. Continue low dose Toprol. Hope to titrate up further on return. Family will monitor his BP. As long as he is not symptomatic will try to continue on this course and titrate up further on return  Chronic systolic congestive heart failure He is NYHA 2-2b.  Hyperlipemia Continue statin. Recent labs are stable.   Automatic implantable cardioverter-defibrillator in situ Follow up with EP as planned.  Carotid stenosis, bilateral  S/p arch and right carotid angiogram with right carotid angioplasty and stenting  CKD Recent lab from primary care are reviewed and look good.   Current medicines are reviewed with the patient today.  The patient does not have concerns regarding medicines other than what has been noted above.  The following changes have been made:  See above.  Labs/ tests ordered today include:   No orders of the defined types were placed in this encounter.     Disposition:   FU with me in 3  months.   Patient is agreeable to this plan and will call if any problems develop in the interim.    Signed: [name], RN, ANP-C 07/18/2015 10:14 AM  [facility] [address] [place], [place]  [address] Phone: [phone] Fax: [phone]         

## 2015-07-18 NOTE — Patient Instructions (Addendum)
We will be checking the following labs today - NONE   Medication Instructions:    Continue with your current medicines. I am  Restart Hydralazine 25 mg take just 1/2 pill twice a day  Restart Imdur (isosorbide) but just 1/2 a pill  I refilled the lasix today    Testing/Procedures To Be Arranged:  N/A  Follow-Up:   See me in 3 months    Other Special Instructions:   Continue to monitor the BP - call us if he is complaining of being dizzy.  Call the [facility] office at [phone] if you have any questions, problems or concerns.

## 2015-08-21 ENCOUNTER — Encounter: Payer: Self-pay | Admitting: Internal Medicine

## 2015-08-26 ENCOUNTER — Encounter: Payer: Self-pay | Admitting: Internal Medicine

## 2015-08-26 ENCOUNTER — Ambulatory Visit (INDEPENDENT_AMBULATORY_CARE_PROVIDER_SITE_OTHER): Payer: Medicare Other | Admitting: Internal Medicine

## 2015-08-26 VITALS — BP 110/60 | HR 88 | Ht 64.0 in | Wt 139.6 lb

## 2015-08-26 DIAGNOSIS — Z95810 Presence of automatic (implantable) cardiac defibrillator: Secondary | ICD-10-CM | POA: Diagnosis not present

## 2015-08-26 DIAGNOSIS — I255 Ischemic cardiomyopathy: Secondary | ICD-10-CM

## 2015-08-26 DIAGNOSIS — I5022 Chronic systolic (congestive) heart failure: Secondary | ICD-10-CM

## 2015-08-26 LAB — CUP PACEART INCLINIC DEVICE CHECK
Battery Remaining Longevity: 133 mo
Date Time Interrogation Session: 20161108143236
HIGH POWER IMPEDANCE MEASURED VALUE: 68 Ohm
Lead Channel Impedance Value: 342 Ohm
Lead Channel Impedance Value: 456 Ohm
Lead Channel Pacing Threshold Amplitude: 1 V
Lead Channel Pacing Threshold Pulse Width: 0.4 ms
Lead Channel Sensing Intrinsic Amplitude: 5.875 mV
Lead Channel Setting Sensing Sensitivity: 0.3 mV
MDC IDC LEAD IMPLANT DT: 20151014
MDC IDC LEAD LOCATION: 753860
MDC IDC MSMT BATTERY VOLTAGE: 3.04 V
MDC IDC SET LEADCHNL RV PACING AMPLITUDE: 2.5 V
MDC IDC SET LEADCHNL RV PACING PULSEWIDTH: 0.4 ms
MDC IDC STAT BRADY RV PERCENT PACED: 0.03 %

## 2015-08-26 NOTE — Patient Instructions (Addendum)
Medication Instructions: - no changes  Labwork: - none  Procedures/Testing: - none  Follow-Up: - Remote monitoring is used to monitor your Pacemaker of ICD from home. This monitoring reduces the number of office visits required to check your device to one time per year. It allows [place] to keep an eye on the functioning of your device to ensure it is working properly. You are scheduled for a device check from home on 11/25/15. You may send your transmission at any time that day. If you have a wireless device, the transmission will be sent automatically. After your physician reviews your transmission, you will receive a postcard with your next transmission date.  - Your physician wants you to follow-up in: 9 months with [name]. You will receive a reminder letter in the mail two months in advance. If you don't receive a letter, please call our office to schedule the follow-up appointment.  Any Additional Special Instructions Will Be Listed Below (If Applicable).

## 2015-08-26 NOTE — Progress Notes (Signed)
k[facility] Patient Care Team: Lonie Peak, PA-C as PCP - General (Physician Assistant) [name], NP as Nurse Practitioner (Nurse Practitioner)   HPI  Adam Gillespie is a 59 y.o. male Seen in followup for an ICD implanted 2006 the setting of ischemic heart disease and prior bypass surgery x5 in 2001  His device was changed out 10/15 with the insertion of a new ICD lead.  He has been seen intercurrently by  LG/SW. He has undergone carotid stenting. This was complicated by hypotension. He is now mostly back on his medications.   Records indicate nuclear study in  Demonstrated 10/15  Low risk stress nuclear study . There is evidence of a previous anteriorlateral MI that is old. .LV Ejection Fraction: 29%. LV Wall Motion: akinesis / severe hypokinesis of the anterior lateral wall.  Echocardiogram 04/2012: EF 25%, inferior, inferoseptal, distal anterior and apical HK, mild AI, mild MR.   The patient denies   chest pain, shortness of breath, nocturnal dyspnea, orthopnea or peripheral edema.  There have been no palpitations, lightheadedness or syncope            Past Medical History  Diagnosis Date  . Anoxic encephalopathy (HCC)     POSTOP FROM 2001  . LV dysfunction     EF is 25% per echo in July 2013  . Carotid stenosis, right     60-79% right, 40 - 59% left - needs dopplers in June 2014  . Hyperlipidemia   . Gout   . Hypothyroidism   . Ischemic cardiomyopathy     Remote MI with CABG x 5 in 2001; Myocardial perfusion imaging EF 26% old infarct anterior wall. 04/2010  . 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 Hearing deficit     Bilateral ears  . 6503 lead 05/01/2014  . Automatic implantable cardiac defibrillator -Medtronic 04/23/2011    a. 07/2014 gen change -  Medtronic EVERA single chamber ICD, serial number  [id number] H.  . ACE inhibitor intolerance     Renal insufficiency/hyperkalemia  . Coronary artery disease   . Heart murmur   . CHF (congestive heart  failure) (HCC)   . Sleep apnea 11-2014    Past Surgical History  Procedure Laterality Date  . Cardiac catheterization  08/05/2000    THERE IS ANTERIOR AND APICAL AKINESIS. EF IS ESTIMATED 20%, WITH INFEROBASILAR PORTIONS CONTRACTING REASONABLY WELL.  Marland Kitchen Coronary artery bypass graft      x5. lima to the lad, saphenous vein graft to the intermediate and obtuse mariginal, and a saphenous vein graft to the acute mariginal and distal right coronary artery  . Cardiac defibrillator placement    . Colonoscopy with propofol N/A 01/09/2013    Procedure: COLONOSCOPY WITH PROPOFOL;  Surgeon: Shirley Friar, MD;  Location: [facility] ENDOSCOPY;  Service: Endoscopy;  Laterality: N/A;  . Esophagogastroduodenoscopy N/A 07/11/2013    Procedure: ESOPHAGOGASTRODUODENOSCOPY (EGD);  Surgeon: Shirley Friar, MD;  Location: [facility] ENDOSCOPY;  Service: Endoscopy;  Laterality: N/A;  . Savory dilation N/A 07/11/2013    Procedure: SAVORY DILATION;  Surgeon: Shirley Friar, MD;  Location: [facility] ENDOSCOPY;  Service: Endoscopy;  Laterality: N/A;  . Balloon dilation N/A 07/11/2013    Procedure: BALLOON DILATION;  Surgeon: Shirley Friar, MD;  Location: [facility] ENDOSCOPY;  Service: Endoscopy;  Laterality: N/A;  . Esophageal dilation  2015  . Implantable cardioverter defibrillator (icd) generator change N/A 07/31/2014    Procedure: ICD GENERATOR CHANGE;  Surgeon: Duke Salvia, MD;  Location: [facility]  CATH LAB;  Service: Cardiovascular;  Laterality: N/A;  . Lead revision N/A 07/31/2014    Procedure: LEAD REVISION;  Surgeon: Duke Salvia, MD;  Location: [facility] CATH LAB;  Service: Cardiovascular;  Laterality: N/A;  . Tonsillectomy    . Adenoidectomy    . Peripheral vascular catheterization N/A 05/21/2015    Procedure: Carotid PTA/Stent Intervention;  Surgeon: Sherren Kerns, MD;  Location: [facility] INVASIVE CV LAB;  Service: Cardiovascular;  Laterality: N/A;    Current Outpatient Prescriptions  Medication Sig Dispense Refill  .  allopurinol (ZYLOPRIM) 100 MG tablet TAKE ONE TABLET BY MOUTH ONCE DAILY 30 tablet 6  . aspirin 81 MG tablet Take 81 mg by mouth daily.      . furosemide (LASIX) 20 MG tablet Take 1 tablet (20 mg total) by mouth 3 (three) times a week. Tuesday, Thursday, Saturday 30 tablet 11  . hydrALAZINE (APRESOLINE) 25 MG tablet Take 0.5 tablets (12.5 mg total) by mouth 2 (two) times daily. 90 tablet 3  . isosorbide mononitrate (IMDUR) 30 MG 24 hr tablet Take 0.5 tablets (15 mg total) by mouth daily. 90 tablet 3  . levothyroxine (SYNTHROID, LEVOTHROID) 125 MCG tablet Take 1 tablet (125 mcg total) by mouth daily. 30 tablet 11  . metoprolol succinate (TOPROL-XL) 25 MG 24 hr tablet Take 12.5 mg by mouth daily.    Marland Kitchen omeprazole (PRILOSEC) 40 MG capsule Take 40 mg by mouth daily.    Marland Kitchen oxybutynin (DITROPAN-XL) 10 MG 24 hr tablet Take 10 mg by mouth daily.    . simvastatin (ZOCOR) 40 MG tablet TAKE ONE TABLET BY MOUTH AT BEDTIME 90 tablet 0  . ticagrelor (BRILINTA) 60 MG TABS tablet Take 1 tablet (60 mg total) by mouth 2 (two) times daily. 60 tablet 3   No current facility-administered medications for this visit.    Allergies  Allergen Reactions  . Ace Inhibitors     Contributed to hyperkalemia and renal insufficiency in the past (see office note 10/03/2012)    Review of Systems negative except from HPI and PMH  Physical Exam BP 110/60 mmHg  Pulse 88  Ht  (1.626 m)  Wt 139 lb 9.6 oz (63.322 kg)  BMI 23.95 kg/m2  SpO2 97% Well developed and well nourished in no acute distress HENT normal E scleral and icterus clear Neck Supple Clear to ausculation anteriorly and posteriorly Device pocket well healed; without hematoma or erythema.  There is no tethering Regular rate and rhythm, no murmurs gallops or rub Soft with active bowel sounds No clubbing cyanosis none Edema Alert and oriented, grossly normal motor and sensory function Skin Warm and Dry     Assessment and  Plan  Ischemic  cardiomyopathy  VT NS  Ace Intolerance--  On hydralazine nitrates  implantable defibrillator-Medtronic    Stable. No symptoms of ischemia. He is euvolemic. There are no symptoms of heart failure. Blood pressure is well-controlled. He is to go for sleep mask fitting in a couple of months.

## 2015-09-10 ENCOUNTER — Other Ambulatory Visit: Payer: Self-pay | Admitting: *Deleted

## 2015-09-10 ENCOUNTER — Other Ambulatory Visit: Payer: Self-pay | Admitting: Physician Assistant

## 2015-09-10 MED ORDER — TICAGRELOR 60 MG PO TABS: 60.0000 mg | ORAL_TABLET | Freq: Two times a day (BID) | ORAL | Status: DC

## 2015-09-25 ENCOUNTER — Other Ambulatory Visit: Payer: Self-pay | Admitting: Nurse Practitioner

## 2015-09-26 NOTE — Telephone Encounter (Signed)
Ok to refill 

## 2015-10-01 ENCOUNTER — Ambulatory Visit (INDEPENDENT_AMBULATORY_CARE_PROVIDER_SITE_OTHER): Payer: Medicare Other | Admitting: Nurse Practitioner

## 2015-10-01 ENCOUNTER — Encounter: Payer: Self-pay | Admitting: Nurse Practitioner

## 2015-10-01 VITALS — BP 120/72 | HR 71 | Ht 64.0 in | Wt 138.4 lb

## 2015-10-01 DIAGNOSIS — Z95810 Presence of automatic (implantable) cardiac defibrillator: Secondary | ICD-10-CM | POA: Diagnosis not present

## 2015-10-01 DIAGNOSIS — I5022 Chronic systolic (congestive) heart failure: Secondary | ICD-10-CM | POA: Diagnosis not present

## 2015-10-01 DIAGNOSIS — I255 Ischemic cardiomyopathy: Secondary | ICD-10-CM | POA: Diagnosis not present

## 2015-10-01 DIAGNOSIS — I2510 Atherosclerotic heart disease of native coronary artery without angina pectoris: Secondary | ICD-10-CM | POA: Diagnosis not present

## 2015-10-01 MED ORDER — FUROSEMIDE 20 MG PO TABS: 20.0000 mg | ORAL_TABLET | ORAL | Status: DC

## 2015-10-01 MED ORDER — METOPROLOL SUCCINATE ER 25 MG PO TB24: 25.0000 mg | ORAL_TABLET | Freq: Every day | ORAL | Status: DC

## 2015-10-01 MED ORDER — ISOSORBIDE MONONITRATE ER 30 MG PO TB24
15.0000 mg | ORAL_TABLET | Freq: Every day | ORAL | Status: DC
Start: 2015-10-01 — End: 2016-04-28

## 2015-10-01 MED ORDER — OMEPRAZOLE 40 MG PO CPDR
40.0000 mg | DELAYED_RELEASE_CAPSULE | Freq: Every day | ORAL | Status: DC
Start: 2015-10-01 — End: 2015-10-31

## 2015-10-01 MED ORDER — HYDRALAZINE HCL 25 MG PO TABS: 12.5000 mg | ORAL_TABLET | Freq: Two times a day (BID) | ORAL | Status: DC

## 2015-10-01 MED ORDER — TICAGRELOR 60 MG PO TABS: 60.0000 mg | ORAL_TABLET | Freq: Two times a day (BID) | ORAL | Status: DC

## 2015-10-01 MED ORDER — SIMVASTATIN 40 MG PO TABS: 40.0000 mg | ORAL_TABLET | Freq: Every day | ORAL | Status: DC

## 2015-10-01 MED ORDER — LEVOTHYROXINE SODIUM 125 MCG PO TABS: 125.0000 ug | ORAL_TABLET | Freq: Every day | ORAL | Status: DC

## 2015-10-01 NOTE — Patient Instructions (Addendum)
We will be checking the following labs today - NONE   Medication Instructions:    Continue with your current medicines. BUT  I am wanting to increase the Toprol to a full tablet of 25 mg     Testing/Procedures To Be Arranged:  N/A  Follow-Up:   See me in 3 months with fasting labs.     Other Special Instructions:   N/A    If you need a refill on your cardiac medications before your next appointment, please call your pharmacy.   Call the [facility] office at [phone] if you have any questions, problems or concerns.

## 2015-10-01 NOTE — Progress Notes (Signed)
CARDIOLOGY OFFICE NOTE  Date:  10/01/2015    Adam Gillespie Date of Birth: May 02, 1956 Medical Record #[id number]  PCP:  [name]  Cardiologist:  Hochrein/Klein/Aws Shere    Chief Complaint  Patient presents with  . Congestive Heart Failure    6 month check - seen for [name]  . Cardiomyopathy  . Coronary Artery Disease    History of Present Illness: Adam Gillespie is a 59 y.o. male who presents today for a follow up visit. Seen for Dr. Antoine Poche and [name]. Emir has a hx of CAD, status post CABG in 2001, ischemic cardiomyopathy with an EF of 25%, status post AICD, hypothyroidism, HL, GERD, CKD, gout, hypothyroidism. 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 He has a tendency towards hyperkalemia and is not on an ACE inhibitor. Myoview 07/2014 low risk.   Seen back in March of 2016 by [name], PA - had progressive carotid disease on the right and was referred to [facility]. Now s/p stenting of the right carotid. He is on aspirin and Brilinta. Complicated by hypotension. Most of his cardiac medicines were stopped. BP remained relatively soft.   I last saw him in September - restarted some of his medicines. Seen by [name] last month and was doing ok.  Comes back today. He is here with his niece who is his power of attorney. Mom is at the hospital with Vernard's younger brother who has had a stroke.  Ananda is doing ok. Actually doing more chores around the house in light of what's going on with the family. No chest pain. Breathing is ok. Not dizzy. Tolerating his medicines. BP running about 130 to 140 now at home. Has probably had labs with PCP since last visit here. Needs meds refilled.   Past Medical History  Diagnosis Date  . Anoxic encephalopathy (HCC)     POSTOP FROM 2001  . LV dysfunction     EF is 25% per echo in July 2013  . Carotid stenosis, right     60-79% right, 40 - 59% left - needs dopplers in June 2014  . Hyperlipidemia   .  Gout   . Hypothyroidism   . Ischemic cardiomyopathy     Remote MI with CABG x 5 in 2001; Myocardial perfusion imaging EF 26% old infarct anterior wall. 04/2010  . 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 Hearing deficit     Bilateral ears  . 8295 lead 05/01/2014  . Automatic implantable cardiac defibrillator -Medtronic 04/23/2011    a. 07/2014 gen change -  Medtronic EVERA single chamber ICD, serial number  [id number].  . ACE inhibitor intolerance     Renal insufficiency/hyperkalemia  . Coronary artery disease   . Heart murmur   . CHF (congestive heart failure) (HCC)   . Sleep apnea 11-2014    Past Surgical History  Procedure Laterality Date  . Cardiac catheterization  08/05/2000    THERE IS ANTERIOR AND APICAL AKINESIS. EF IS ESTIMATED 20%, WITH INFEROBASILAR PORTIONS CONTRACTING REASONABLY WELL.  Marland Kitchen Coronary artery bypass graft      x5. lima to the lad, saphenous vein graft to the intermediate and obtuse mariginal, and a saphenous vein graft to the acute mariginal and distal right coronary artery  . Cardiac defibrillator placement    . Colonoscopy with propofol N/A 01/09/2013    Procedure: COLONOSCOPY WITH PROPOFOL;  Surgeon: Shirley Friar, MD;  Location: [facility] ENDOSCOPY;  Service: Endoscopy;  Laterality: N/A;  .  Esophagogastroduodenoscopy N/A 07/11/2013    Procedure: ESOPHAGOGASTRODUODENOSCOPY (EGD);  Surgeon: Shirley Friar, MD;  Location: [facility] ENDOSCOPY;  Service: Endoscopy;  Laterality: N/A;  . Savory dilation N/A 07/11/2013    Procedure: SAVORY DILATION;  Surgeon: Shirley Friar, MD;  Location: [facility] ENDOSCOPY;  Service: Endoscopy;  Laterality: N/A;  . Balloon dilation N/A 07/11/2013    Procedure: BALLOON DILATION;  Surgeon: Shirley Friar, MD;  Location: [facility] ENDOSCOPY;  Service: Endoscopy;  Laterality: N/A;  . Esophageal dilation  2015  . Implantable cardioverter defibrillator (icd) generator change N/A 07/31/2014    Procedure: ICD GENERATOR CHANGE;  Surgeon:  Duke Salvia, MD;  Location: [facility] CATH LAB;  Service: Cardiovascular;  Laterality: N/A;  . Lead revision N/A 07/31/2014    Procedure: LEAD REVISION;  Surgeon: Duke Salvia, MD;  Location: [facility] CATH LAB;  Service: Cardiovascular;  Laterality: N/A;  . Tonsillectomy    . Adenoidectomy    . Peripheral vascular catheterization N/A 05/21/2015    Procedure: Carotid PTA/Stent Intervention;  Surgeon: Sherren Kerns, MD;  Location: [facility] INVASIVE CV LAB;  Service: Cardiovascular;  Laterality: N/A;     Medications: Current Outpatient Prescriptions  Medication Sig Dispense Refill  . allopurinol (ZYLOPRIM) 100 MG tablet TAKE ONE TABLET BY MOUTH ONCE DAILY 30 tablet 8  . aspirin 81 MG tablet Take 81 mg by mouth daily.      . furosemide (LASIX) 20 MG tablet Take 1 tablet (20 mg total) by mouth 3 (three) times a week. Tuesday, Thursday, Saturday 30 tablet 3  . hydrALAZINE (APRESOLINE) 25 MG tablet Take 0.5 tablets (12.5 mg total) by mouth 2 (two) times daily. 90 tablet 3  . isosorbide mononitrate (IMDUR) 30 MG 24 hr tablet Take 0.5 tablets (15 mg total) by mouth daily. 90 tablet 3  . levothyroxine (SYNTHROID, LEVOTHROID) 125 MCG tablet Take 1 tablet (125 mcg total) by mouth daily. 90 tablet 3  . metoprolol succinate (TOPROL-XL) 25 MG 24 hr tablet Take 1 tablet (25 mg total) by mouth daily. 90 tablet 3  . omeprazole (PRILOSEC) 40 MG capsule Take 1 capsule (40 mg total) by mouth daily. 90 capsule 3  . oxybutynin (DITROPAN-XL) 10 MG 24 hr tablet Take 10 mg by mouth daily.    . simvastatin (ZOCOR) 40 MG tablet Take 1 tablet (40 mg total) by mouth at bedtime. 90 tablet 3  . tamsulosin (FLOMAX) 0.4 MG CAPS capsule Take 0.4 mg by mouth at bedtime.    . ticagrelor (BRILINTA) 60 MG TABS tablet Take 1 tablet (60 mg total) by mouth 2 (two) times daily. 180 tablet 3   No current facility-administered medications for this visit.    Allergies: Allergies  Allergen Reactions  . Ace Inhibitors     Contributed to  hyperkalemia and renal insufficiency in the past (see office note 10/03/2012)    Social History: The patient  reports that he has never smoked. His smokeless tobacco use includes Chew. He reports that he does not drink alcohol or use illicit drugs.   Family History: The patient's family history includes Asthma in his sister; Cancer in his brother and father; Emphysema in his father and sister; Heart attack in his maternal grandfather and mother; Stroke in his maternal grandmother.   Review of Systems: Please see the history of present illness.   Otherwise, the review of systems is positive for none.   All other systems are reviewed and negative.   Physical Exam: VS:  BP 120/72 mmHg  Pulse 71  Ht 5\' 4"  (1.626 m)  Wt 138 lb 6.4 oz (62.778 kg)  BMI 23.74 kg/m2  SpO2 91% .  BMI Body mass index is 23.74 kg/(m^2).  Wt Readings from Last 3 Encounters:  10/01/15 138 lb 6.4 oz (62.778 kg)  08/26/15 139 lb 9.6 oz (63.322 kg)  07/18/15 138 lb 12.8 oz (62.959 kg)    General: Pleasant. He is alert and in no acute distress. He is very hard of hearing.  HEENT: Normal. Neck: Supple, no JVD, carotid bruits, or masses noted.  Cardiac: Regular rate and rhythm. No murmurs, rubs, or gallops. No edema.  Respiratory:  Lungs are clear to auscultation bilaterally with normal work of breathing.  GI: Soft and nontender.  MS: No deformity or atrophy. Gait and ROM intact. Skin: Warm and dry. Color is normal.  Neuro:  Strength and sensation are intact and no gross focal deficits noted.  Psych: Alert, appropriate and with normal affect.   LABORATORY DATA:  EKG:  EKG is not ordered today.  Lab Results  Component Value Date   WBC 14.4* 05/21/2015   HGB 14.7 05/21/2015   HCT 42.3 05/21/2015   PLT 321 05/21/2015   GLUCOSE 91 05/23/2015   CHOL 142 06/03/2014   TRIG 210.0* 06/03/2014   HDL 32.50* 06/03/2014   LDLDIRECT 87.5 06/03/2014   LDLCALC 70 11/01/2011   ALT 17 06/03/2014   AST 20 06/03/2014     NA 138 05/23/2015   K 4.6 05/23/2015   CL 110 05/23/2015   CREATININE 1.63* 05/23/2015   BUN 29* 05/23/2015   CO2 20* 05/23/2015   TSH 1.00 12/03/2013   INR 1.05 02/14/2013   HGBA1C 5.8* 02/15/2013    BNP (last 3 results) No results for input(s): BNP in the last 8760 hours.  ProBNP (last 3 results) No results for input(s): PROBNP in the last 8760 hours.   Other Studies Reviewed Today:  Myoview August 22, 2014 Low risk stress nuclear study .  There is evidence of a previous anteriorlateral MI that is old. No ischemia LV Ejection Fraction: 29%. LV Wall Motion: akinesis / severe hypokinesis of the anterior lateral wall.  Echo 7/13 - EF 25% with akinesis of theinferior,inferoseptal, distal anterior and apical walls; hypookinesis elsewhere. . - Aortic valve: Mild regurgitation. - Mitral valve: Mild regurgitation.  Assessment/Plan:  Coronary artery disease involving native coronary artery of native heart without angina pectoris No angina. Would manage medically. Medicines refilled today.   Ischemic cardiomyopathy He is not a candidate for ACE inhibitor given prior history of hyperkalemia. Back on low doses of Imdur and Hydralazine. Increasing his beta blocker today. See back in 3 months and will continue to try and increase as tolerated. He is doing well clinically.   Chronic systolic congestive heart failure He is NYHA 2-2b.  Hyperlipemia Continue statin. Check labs on return  Automatic implantable cardioverter-defibrillator in situ Follow up with EP as planned.  Carotid stenosis, bilateral  S/p arch and right carotid angiogram with right carotid angioplasty and stenting - he remains on Brilinta and has follow up with Dr. Darrick Penna.   CKD Recent lab from primary care are reviewed and look good.   Current medicines are reviewed with the patient today.  The patient does not have concerns regarding medicines other than what has been noted above.  The following changes  have been made:  See above.  Labs/ tests ordered today include:   No orders of the defined types were placed in this encounter.     Disposition:  FU with me in 3 months.   Patient is agreeable to this plan and will call if any problems develop in the interim.   Signed: [name], RN, ANP-C 10/01/2015 10:35 AM  [facility] [address] [place], [place]  [address] Phone: [phone] Fax: [phone]

## 2015-10-22 ENCOUNTER — Other Ambulatory Visit: Payer: Self-pay | Admitting: *Deleted

## 2015-10-24 ENCOUNTER — Other Ambulatory Visit: Payer: Self-pay | Admitting: *Deleted

## 2015-10-24 NOTE — Telephone Encounter (Signed)
allopurinol (ZYLOPRIM) 100 MG tablet  Medication   Date: 09/26/2015  Department: [facility] [address] Office  Ordering/Authorizing: [name], NP      Order Providers    Prescribing Provider Encounter Provider   [name], NP [name], NP    Medication Detail      Disp Refills Start End     allopurinol (ZYLOPRIM) 100 MG tablet 30 tablet 8 09/26/2015     Sig: TAKE ONE TABLET BY MOUTH ONCE DAILY    E-Prescribing Status: Receipt confirmed by pharmacy (09/26/2015 5:01 PM EST)     Pharmacy    [id number] - [place], [place] - [address]

## 2015-10-29 ENCOUNTER — Telehealth: Payer: Self-pay | Admitting: Nurse Practitioner

## 2015-10-29 NOTE — Telephone Encounter (Signed)
Would like for Dr. Darrick Penna to give me the ok to change to Plavix. He is on Omeprazole but that can be changed.  He will most likely not be able to afford Brilinta long term.

## 2015-10-29 NOTE — Telephone Encounter (Signed)
New message      Dr Darrick Penna want Adam Gillespie to oversee patient's blood thinner.  He is currently on brilinta but it cost 217.00 a month with ins.   Please call

## 2015-10-29 NOTE — Telephone Encounter (Signed)
Here's a request.

## 2015-10-30 NOTE — Telephone Encounter (Signed)
Please call and let his POA know that we can switch to Plavix when his Brilinta runs out - should be cheaper option for him.   He will need to stop Omeprazole Ok to use Pepcid 20 mg daily in its place Plavix 75 mg a day.

## 2015-10-31 ENCOUNTER — Other Ambulatory Visit: Payer: Self-pay | Admitting: *Deleted

## 2015-10-31 MED ORDER — FAMOTIDINE 20 MG PO TABS: 20.0000 mg | ORAL_TABLET | Freq: Every day | ORAL | Status: DC

## 2015-10-31 MED ORDER — CLOPIDOGREL BISULFATE 75 MG PO TABS: 75.0000 mg | ORAL_TABLET | Freq: Every day | ORAL | Status: DC

## 2015-10-31 NOTE — Telephone Encounter (Signed)
Called POA and advised that we will be sending orders for Plavix 75mg , one daily and famotidine 20mg  , one daily to [facility].  Discontinue Brilinta when patient runs out and discontinue omeprazole at the same time and start the famotidine.

## 2015-11-18 ENCOUNTER — Other Ambulatory Visit: Payer: Self-pay | Admitting: Nurse Practitioner

## 2015-11-18 MED ORDER — ALLOPURINOL 100 MG PO TABS: 100.0000 mg | ORAL_TABLET | Freq: Every day | ORAL | Status: DC

## 2015-11-18 NOTE — Telephone Encounter (Signed)
Pt wanted a 90 day supply sent to [facility], Rx resent. Confirmation received.

## 2015-11-24 ENCOUNTER — Other Ambulatory Visit: Payer: Self-pay | Admitting: *Deleted

## 2015-11-24 MED ORDER — ALLOPURINOL 100 MG PO TABS: 100.0000 mg | ORAL_TABLET | Freq: Every day | ORAL | Status: DC

## 2015-11-25 ENCOUNTER — Ambulatory Visit (INDEPENDENT_AMBULATORY_CARE_PROVIDER_SITE_OTHER): Payer: Medicare Other | Admitting: *Deleted

## 2015-11-25 DIAGNOSIS — I255 Ischemic cardiomyopathy: Secondary | ICD-10-CM

## 2015-11-25 NOTE — Progress Notes (Signed)
Remote ICD transmission.   

## 2015-12-20 LAB — CUP PACEART REMOTE DEVICE CHECK
Battery Voltage: 3.03 V
HighPow Impedance: 67 Ohm
Implantable Lead Implant Date: 20151014
Implantable Lead Location: 753860
Lead Channel Impedance Value: 399 Ohm
Lead Channel Pacing Threshold Pulse Width: 0.4 ms
Lead Channel Sensing Intrinsic Amplitude: 6.125 mV
Lead Channel Setting Pacing Amplitude: 2.5 V
Lead Channel Setting Pacing Pulse Width: 0.4 ms
Lead Channel Setting Sensing Sensitivity: 0.3 mV
MDC IDC MSMT BATTERY REMAINING LONGEVITY: 131 mo
MDC IDC MSMT LEADCHNL RV IMPEDANCE VALUE: 323 Ohm
MDC IDC MSMT LEADCHNL RV PACING THRESHOLD AMPLITUDE: 0.875 V
MDC IDC MSMT LEADCHNL RV SENSING INTR AMPL: 6.125 mV
MDC IDC SESS DTM: 20170207051605
MDC IDC STAT BRADY RV PERCENT PACED: 0.01 %

## 2015-12-20 NOTE — Progress Notes (Signed)
Normal remote reviewed.  Next [facility] 02-24-16 

## 2015-12-21 ENCOUNTER — Encounter: Payer: Self-pay | Admitting: Cardiology

## 2015-12-22 ENCOUNTER — Encounter: Payer: Self-pay | Admitting: Vascular Surgery

## 2015-12-25 ENCOUNTER — Ambulatory Visit (HOSPITAL_COMMUNITY)
Admission: RE | Admit: 2015-12-25 | Discharge: 2015-12-25 | Disposition: A | Discharge status: Home / Self Care | Payer: Medicare Other | Source: Ambulatory Visit | Attending: Vascular Surgery | Admitting: Vascular Surgery

## 2015-12-25 ENCOUNTER — Encounter: Payer: Self-pay | Admitting: Vascular Surgery

## 2015-12-25 ENCOUNTER — Other Ambulatory Visit: Payer: Self-pay | Admitting: *Deleted

## 2015-12-25 ENCOUNTER — Ambulatory Visit (INDEPENDENT_AMBULATORY_CARE_PROVIDER_SITE_OTHER): Payer: Medicare Other | Admitting: Vascular Surgery

## 2015-12-25 VITALS — BP 103/71 | HR 77 | Temp 97.7°F | Resp 16 | Ht 64.0 in | Wt 139.0 lb

## 2015-12-25 DIAGNOSIS — I509 Heart failure, unspecified: Secondary | ICD-10-CM | POA: Diagnosis not present

## 2015-12-25 DIAGNOSIS — E785 Hyperlipidemia, unspecified: Secondary | ICD-10-CM | POA: Diagnosis not present

## 2015-12-25 DIAGNOSIS — I6521 Occlusion and stenosis of right carotid artery: Secondary | ICD-10-CM | POA: Diagnosis not present

## 2015-12-25 DIAGNOSIS — I6523 Occlusion and stenosis of bilateral carotid arteries: Secondary | ICD-10-CM

## 2015-12-25 NOTE — Progress Notes (Signed)
[facility] HISTORY AND PHYSICAL     History of Present Illness:  Patient is a 60 y.o. year old male who presents for follow-up after right carotid stent.  Denies headaches, numbness, tingling or other neuro deficits.  He denies any stroke-type symptoms. He is on plavix and aspirin.  Current Outpatient Prescriptions on File Prior to Visit  Medication Sig Dispense Refill  . allopurinol (ZYLOPRIM) 100 MG tablet Take 1 tablet (100 mg total) by mouth daily. 90 tablet 2  . aspirin 81 MG tablet Take 81 mg by mouth daily.      . clopidogrel (PLAVIX) 75 MG tablet Take 1 tablet (75 mg total) by mouth daily. 90 tablet 3  . famotidine (PEPCID) 20 MG tablet Take 1 tablet (20 mg total) by mouth daily. 90 tablet 3  . furosemide (LASIX) 20 MG tablet Take 1 tablet (20 mg total) by mouth 3 (three) times a week. Tuesday, Thursday, Saturday 30 tablet 3  . hydrALAZINE (APRESOLINE) 25 MG tablet Take 0.5 tablets (12.5 mg total) by mouth 2 (two) times daily. 90 tablet 3  . isosorbide mononitrate (IMDUR) 30 MG 24 hr tablet Take 0.5 tablets (15 mg total) by mouth daily. 90 tablet 3  . levothyroxine (SYNTHROID, LEVOTHROID) 125 MCG tablet Take 1 tablet (125 mcg total) by mouth daily. 90 tablet 3  . metoprolol succinate (TOPROL-XL) 25 MG 24 hr tablet Take 1 tablet (25 mg total) by mouth daily. 90 tablet 3  . oxybutynin (DITROPAN-XL) 10 MG 24 hr tablet Take 10 mg by mouth daily.    . simvastatin (ZOCOR) 40 MG tablet Take 1 tablet (40 mg total) by mouth at bedtime. 90 tablet 3  . tamsulosin (FLOMAX) 0.4 MG CAPS capsule Take 0.4 mg by mouth at bedtime.     No current facility-administered medications on file prior to visit.     Physical Examination     Filed Vitals:   12/25/15 1531 12/25/15 1533  BP: 122/71 103/71  Pulse: 73 77  Temp: 97.7 F (36.5 C)   Resp: 16   Height: 5\' 4"  (1.626 m)   Weight: 139 lb (63.05 kg)   SpO2: 100%     General:  Alert and oriented, no acute  distress Neck: No bruit or JVD Skin: No rash Extremities:  2+ femoral pulses no mass right groin Neurologic: Upper and lower extremity motor 5/5 and symmetric  DATA: Patient had a carotid duplex scan today which showed external carotid artery stenosis on the left but no significant internal carotid artery stenosis, patent right internal carotid artery stent with no residual stenosis   ASSESSMENT: Doing well status post right carotid stent.    PLAN:  Continue  Plavix and aspirin. Follow-up carotid duplex 6 months   Fabienne Bruns, MD [facility] Office: [phone] Pager: [phone]

## 2015-12-31 ENCOUNTER — Other Ambulatory Visit: Payer: Self-pay | Admitting: *Deleted

## 2015-12-31 ENCOUNTER — Ambulatory Visit (INDEPENDENT_AMBULATORY_CARE_PROVIDER_SITE_OTHER): Payer: Medicare Other | Admitting: Nurse Practitioner

## 2015-12-31 ENCOUNTER — Encounter: Payer: Self-pay | Admitting: Nurse Practitioner

## 2015-12-31 ENCOUNTER — Telehealth: Payer: Self-pay | Admitting: Internal Medicine

## 2015-12-31 VITALS — BP 100/70 | HR 62 | Ht 64.0 in | Wt 138.1 lb

## 2015-12-31 DIAGNOSIS — E785 Hyperlipidemia, unspecified: Secondary | ICD-10-CM

## 2015-12-31 DIAGNOSIS — I5022 Chronic systolic (congestive) heart failure: Secondary | ICD-10-CM

## 2015-12-31 DIAGNOSIS — I255 Ischemic cardiomyopathy: Secondary | ICD-10-CM | POA: Diagnosis not present

## 2015-12-31 LAB — LIPID PANEL
Cholesterol: 153 mg/dL (ref 125–200)
HDL: 42 mg/dL (ref >=40)
LDL Cholesterol: 82 mg/dL (ref <130)
Total CHOL/HDL Ratio: 3.6 Ratio (ref <=5.0)
Triglycerides: 146 mg/dL (ref <150)
VLDL: 29 mg/dL (ref <30)

## 2015-12-31 LAB — BASIC METABOLIC PANEL
BUN: 16 mg/dL (ref 7–25)
CO2: 25 mmol/L (ref 20–31)
Calcium: 9.5 mg/dL (ref 8.6–10.3)
Chloride: 109 mmol/L (ref 98–110)
Creat: 1.22 mg/dL (ref 0.70–1.33)
Glucose, Bld: 87 mg/dL (ref 65–99)
Potassium: 5.6 mmol/L — ABNORMAL HIGH (ref 3.5–5.3)
Sodium: 142 mmol/L (ref 135–146)

## 2015-12-31 LAB — HEPATIC FUNCTION PANEL
ALT: 9 U/L (ref 9–46)
AST: 12 U/L (ref 10–35)
Albumin: 3.8 g/dL (ref 3.6–5.1)
Alkaline Phosphatase: 58 U/L (ref 40–115)
Bilirubin, Direct: 0.1 mg/dL (ref <=0.2)
Indirect Bilirubin: 0.4 mg/dL (ref 0.2–1.2)
Total Bilirubin: 0.5 mg/dL (ref 0.2–1.2)
Total Protein: 6.7 g/dL (ref 6.1–8.1)

## 2015-12-31 NOTE — Addendum Note (Signed)
Addended by: [name] on: 12/31/2015 10:56 AM   Modules accepted: Orders

## 2015-12-31 NOTE — Telephone Encounter (Signed)
Walk in pt form-[place] Form [id number]-dropped off gave to [employer] Thursday 01/01/16/KM

## 2015-12-31 NOTE — Patient Instructions (Addendum)
We will be checking the following labs today - BMET, lipids, HPF and TSH   Medication Instructions:    Continue with your current medicines.     Testing/Procedures To Be Arranged:  N/A  Follow-Up:   See [place] in 4 months    Other Special Instructions:   N/A    If you need a refill on your cardiac medications before your next appointment, please call your pharmacy.   Call the [facility] office at [phone] if you have any questions, problems or concerns.

## 2015-12-31 NOTE — Progress Notes (Signed)
CARDIOLOGY OFFICE NOTE  Date:  12/31/2015    Adam Gillespie Date of Birth: 05-12-1956 Medical Record #[id number]  PCP:  ROSE, Katrinka Blazing, NP  Cardiologist:  Hochrein/Klein    Chief Complaint  Patient presents with  . Congestive Heart Failure  . Cardiomyopathy  . Coronary Artery Disease  . Hypertension  . Hyperlipidemia    3 month check - seen for [name]    History of Present Illness: Adam Gillespie is a 60 y.o. male who presents today for a 3 month check. Seen for Dr. Antoine Poche and [name]. Adam Gillespie has a hx of CAD, status post CABG in 2001, ischemic cardiomyopathy with an EF of 25%, status post AICD, hypothyroidism, HL, GERD, CKD, gout, hypothyroidism. 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 He has a tendency towards hyperkalemia and is not on an ACE inhibitor. Myoview 07/2014 low risk.   Seen back in March of 2016 by [name], PA - had progressive carotid disease on the right and was referred to [facility]. Now s/p stenting of the right carotid. He is on aspirin and Brilinta. Complicated by hypotension. Most of his cardiac medicines were stopped. BP remained relatively soft.   I last saw him back in September - restarted some of his medicines.   I last saw him in December - was doing well. BP higher. Medicines were titrated back on up.   Comes back today. He is here with his niece who is his power of attorney. She has now moved in with [place]. His brother passed. Mom is in the hospital and going to SNF. He seems to be at his baseline. No chest pain. Breathing is ok. Weight is stable. 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 He would like to do another garden this year - some of the family is a little resistant - but this is felt to be good for him.  Past Medical History  Diagnosis Date  . Anoxic encephalopathy (HCC)     POSTOP FROM 2001  . LV dysfunction     EF is 25% per echo in July 2013  . Carotid stenosis, right    60-79% right, 40 - 59% left - needs dopplers in June 2014  . Hyperlipidemia   . Gout   . Hypothyroidism   . Ischemic cardiomyopathy     Remote MI with CABG x 5 in 2001; Myocardial perfusion imaging EF 26% old infarct anterior wall. 04/2010  . 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 Hearing deficit     Bilateral ears  . 4098 lead 05/01/2014  . Automatic implantable cardiac defibrillator -Medtronic 04/23/2011    a. 07/2014 gen change -  Medtronic EVERA single chamber ICD, serial number  [id number] H.  . ACE inhibitor intolerance     Renal insufficiency/hyperkalemia  . Coronary artery disease   . Heart murmur   . CHF (congestive heart failure) (HCC)   . Sleep apnea 11-2014    Past Surgical History  Procedure Laterality Date  . Cardiac catheterization  08/05/2000    THERE IS ANTERIOR AND APICAL AKINESIS. EF IS ESTIMATED 20%, WITH INFEROBASILAR PORTIONS CONTRACTING REASONABLY WELL.  Marland Kitchen Coronary artery bypass graft      x5. lima to the lad, saphenous vein graft to the intermediate and obtuse mariginal, and a saphenous vein graft to the acute mariginal and distal right coronary artery  . Cardiac defibrillator placement    . Colonoscopy with propofol N/A 01/09/2013  Procedure: COLONOSCOPY WITH PROPOFOL;  Surgeon: Shirley Friar, MD;  Location: [facility] ENDOSCOPY;  Service: Endoscopy;  Laterality: N/A;  . Esophagogastroduodenoscopy N/A 07/11/2013    Procedure: ESOPHAGOGASTRODUODENOSCOPY (EGD);  Surgeon: Shirley Friar, MD;  Location[facility] ENDOSCOPY;  Service: Endoscopy;  Laterality: N/A;  . Savory dilation N/A 07/11/2013    Procedure: SAVORY DILATION;  Surgeon: Shirley Friar, MD;  Location: [facility] ENDOSCOPY;  Service: Endoscopy;  Laterality: N/A;  . Balloon dilation N/A 07/11/2013    Procedure: BALLOON DILATION;  Surgeon: Shirley Friar, MD;  Location: [facility] ENDOSCOPY;  Service: Endoscopy;  Laterality: N/A;  . Esophageal dilation  2015  . Implantable cardioverter defibrillator (icd)  generator change N/A 07/31/2014    Procedure: ICD GENERATOR CHANGE;  Surgeon: Duke Salvia, MD;  Location: [facility] CATH LAB;  Service: Cardiovascular;  Laterality: N/A;  . Lead revision N/A 07/31/2014    Procedure: LEAD REVISION;  Surgeon: Duke Salvia, MD;  Location: [facility] CATH LAB;  Service: Cardiovascular;  Laterality: N/A;  . Tonsillectomy    . Adenoidectomy    . Peripheral vascular catheterization N/A 05/21/2015    Procedure: Carotid PTA/Stent Intervention;  Surgeon: Sherren Kerns, MD;  Location: [facility] INVASIVE CV LAB;  Service: Cardiovascular;  Laterality: N/A;     Medications: Current Outpatient Prescriptions  Medication Sig Dispense Refill  . allopurinol (ZYLOPRIM) 100 MG tablet Take 1 tablet (100 mg total) by mouth daily. 90 tablet 2  . aspirin 81 MG tablet Take 81 mg by mouth daily.      . clopidogrel (PLAVIX) 75 MG tablet Take 1 tablet (75 mg total) by mouth daily. 90 tablet 3  . famotidine (PEPCID) 20 MG tablet Take 1 tablet (20 mg total) by mouth daily. 90 tablet 3  . furosemide (LASIX) 20 MG tablet Take 1 tablet (20 mg total) by mouth 3 (three) times a week. Tuesday, Thursday, Saturday 30 tablet 3  . hydrALAZINE (APRESOLINE) 25 MG tablet Take 0.5 tablets (12.5 mg total) by mouth 2 (two) times daily. 90 tablet 3  . isosorbide mononitrate (IMDUR) 30 MG 24 hr tablet Take 0.5 tablets (15 mg total) by mouth daily. 90 tablet 3  . levothyroxine (SYNTHROID, LEVOTHROID) 125 MCG tablet Take 1 tablet (125 mcg total) by mouth daily. 90 tablet 3  . metoprolol succinate (TOPROL-XL) 25 MG 24 hr tablet Take 1 tablet (25 mg total) by mouth daily. 90 tablet 3  . oxybutynin (DITROPAN-XL) 10 MG 24 hr tablet Take 10 mg by mouth daily.    . simvastatin (ZOCOR) 40 MG tablet Take 1 tablet (40 mg total) by mouth at bedtime. 90 tablet 3  . tamsulosin (FLOMAX) 0.4 MG CAPS capsule Take 0.4 mg by mouth at bedtime.     No current facility-administered medications for this visit.    Allergies: Allergies    Allergen Reactions  . Ace Inhibitors     Contributed to hyperkalemia and renal insufficiency in the past (see office note 10/03/2012)    Social History: The patient  reports that he has never smoked. His smokeless tobacco use includes Chew. He reports that he does not drink alcohol or use illicit drugs.   Family History: The patient's family history includes Asthma in his sister; Cancer in his brother and father; Emphysema in his father and sister; Heart attack in his maternal grandfather and mother; Stroke in his maternal grandmother.   Review of Systems: Please see the history of present illness.   Otherwise, the review of systems is positive for none.  All other systems are reviewed and negative.   Physical Exam: VS:  BP 100/70 mmHg  Pulse 62  Ht 5\' 4"  (1.626 m)  Wt 138 lb 1.9 oz (62.651 kg)  BMI 23.70 kg/m2 .  BMI Body mass index is 23.7 kg/(m^2).  Wt Readings from Last 3 Encounters:  12/31/15 138 lb 1.9 oz (62.651 kg)  12/25/15 139 lb (63.05 kg)  10/01/15 138 lb 6.4 oz (62.778 kg)    General: Pleasant. Little mentally slow but he is alert and in no acute distress.  HEENT: Normal. Neck: Supple, no JVD, carotid bruits, or masses noted.  Cardiac: Regular rate and rhythm. No murmurs, rubs, or gallops. No edema.  Respiratory:  Lungs are clear to auscultation bilaterally with normal work of breathing.  GI: Soft and nontender.  MS: No deformity or atrophy. Gait and ROM intact. Skin: Warm and dry. Color is normal.  Neuro:  Strength and sensation are intact and no gross focal deficits noted.  Psych: Alert, appropriate and with normal affect.   LABORATORY DATA:  EKG:  EKG is not ordered today.  Lab Results  Component Value Date   WBC 14.4* 05/21/2015   HGB 14.7 05/21/2015   HCT 42.3 05/21/2015   PLT 321 05/21/2015   GLUCOSE 91 05/23/2015   CHOL 142 06/03/2014   TRIG 210.0* 06/03/2014   HDL 32.50* 06/03/2014   LDLDIRECT 87.5 06/03/2014   LDLCALC 70 11/01/2011   ALT  17 06/03/2014   AST 20 06/03/2014   NA 138 05/23/2015   K 4.6 05/23/2015   CL 110 05/23/2015   CREATININE 1.63* 05/23/2015   BUN 29* 05/23/2015   CO2 20* 05/23/2015   TSH 1.00 12/03/2013   INR 1.05 02/14/2013   HGBA1C 5.8* 02/15/2013    BNP (last 3 results) No results for input(s): BNP in the last 8760 hours.  ProBNP (last 3 results) No results for input(s): PROBNP in the last 8760 hours.   Other Studies Reviewed Today:  Myoview 08-30-14 Low risk stress nuclear study .  There is evidence of a previous anteriorlateral MI that is old. No ischemia LV Ejection Fraction: 29%. LV Wall Motion: akinesis / severe hypokinesis of the anterior lateral wall.  Echo 7/13 - EF 25% with akinesis of theinferior,inferoseptal, distal anterior and apical walls; hypookinesis elsewhere. . - Aortic valve: Mild regurgitation. - Mitral valve: Mild regurgitation.  Assessment/Plan:  Coronary artery disease involving native coronary artery of native heart without angina pectoris No angina. Would manage medically. I have left him on his current regimen.   Ischemic cardiomyopathy He is not a candidate for ACE inhibitor given prior history of hyperkalemia. Back on low doses of Imdur and Hydralazine. Beta blocker increased at last visit.  BP lower - I have left him on his current regimen.   Chronic systolic congestive heart failure He is NYHA 2-2b.  Hyperlipemia Continue statin. Needs labs today  Automatic implantable cardioverter-defibrillator in situ Follow up with EP as planned.  Carotid stenosis, bilateral  S/p arch and right carotid angiogram with right carotid angioplasty and stenting - he is now on Plavix. Followed by Dr. Darrick Penna.   CKD - labs today  Current medicines are reviewed with the patient today.  The patient does not have concerns regarding medicines other than what has been noted above.  The following changes have been made:  See above.  Labs/ tests ordered today  include:   No orders of the defined types were placed in this encounter.     Disposition:  FU with me in 4 months.   Patient is agreeable to this plan and will call if any problems develop in the interim.   Signed: [name], RN, ANP-C 12/31/2015 10:32 AM  [facility] [address] [place], [place]  [address] Phone: [phone] Fax: [phone]

## 2016-01-01 LAB — TSH: TSH: 1 mIU/L (ref 0.40–4.50)

## 2016-01-02 NOTE — Telephone Encounter (Signed)
Spoke with pt's POA, Cheryl. Informed her that [name] cannot declare patient disabled from ICD standpoint. She is aware I will arrange for pt to re-establish care with Dr. Antoine Poche (per her request) so that this disability paperwork may be addressed and they are able to start seeing him again.

## 2016-01-16 NOTE — Telephone Encounter (Signed)
Patient recently seen by [name], NP in office. Discussed paperwork w/ her and Dr. Antoine Poche. Dr. Antoine Poche agreeable to completing forms without needing appt to be seen by him. Will send paperwork to [facility] office for him to sign. Spoke w/ Elnita Maxwell, [place], and informed her.  She is very grateful for the help on this.

## 2016-02-24 ENCOUNTER — Ambulatory Visit (INDEPENDENT_AMBULATORY_CARE_PROVIDER_SITE_OTHER): Payer: Medicare Other | Admitting: *Deleted

## 2016-02-24 DIAGNOSIS — I255 Ischemic cardiomyopathy: Secondary | ICD-10-CM | POA: Diagnosis not present

## 2016-02-25 NOTE — Progress Notes (Signed)
Remote ICD transmission.   

## 2016-03-31 ENCOUNTER — Encounter: Payer: Self-pay | Admitting: Cardiology

## 2016-03-31 LAB — CUP PACEART REMOTE DEVICE CHECK
Battery Voltage: 3.02 V
Brady Statistic RV Percent Paced: 0.01 %
HighPow Impedance: 70 Ohm
Implantable Lead Location: 753860
Lead Channel Impedance Value: 323 Ohm
Lead Channel Impedance Value: 399 Ohm
Lead Channel Setting Pacing Amplitude: 2.5 V
Lead Channel Setting Sensing Sensitivity: 0.3 mV
MDC IDC LEAD IMPLANT DT: 20151014
MDC IDC MSMT BATTERY REMAINING LONGEVITY: 129 mo
MDC IDC MSMT LEADCHNL RV PACING THRESHOLD AMPLITUDE: 1 V
MDC IDC MSMT LEADCHNL RV PACING THRESHOLD PULSEWIDTH: 0.4 ms
MDC IDC MSMT LEADCHNL RV SENSING INTR AMPL: 5.75 mV
MDC IDC MSMT LEADCHNL RV SENSING INTR AMPL: 5.75 mV
MDC IDC SESS DTM: 20170509083723
MDC IDC SET LEADCHNL RV PACING PULSEWIDTH: 0.4 ms

## 2016-04-19 ENCOUNTER — Other Ambulatory Visit: Payer: Self-pay | Admitting: Cardiology

## 2016-04-19 ENCOUNTER — Other Ambulatory Visit: Payer: Self-pay | Admitting: Nurse Practitioner

## 2016-04-28 ENCOUNTER — Encounter (INDEPENDENT_AMBULATORY_CARE_PROVIDER_SITE_OTHER): Payer: Self-pay

## 2016-04-28 ENCOUNTER — Ambulatory Visit (INDEPENDENT_AMBULATORY_CARE_PROVIDER_SITE_OTHER): Payer: Medicare Other | Admitting: Nurse Practitioner

## 2016-04-28 ENCOUNTER — Encounter: Payer: Self-pay | Admitting: Nurse Practitioner

## 2016-04-28 VITALS — BP 110/70 | HR 77 | Ht 64.0 in | Wt 135.1 lb

## 2016-04-28 DIAGNOSIS — I2510 Atherosclerotic heart disease of native coronary artery without angina pectoris: Secondary | ICD-10-CM | POA: Diagnosis not present

## 2016-04-28 DIAGNOSIS — I5022 Chronic systolic (congestive) heart failure: Secondary | ICD-10-CM

## 2016-04-28 DIAGNOSIS — I255 Ischemic cardiomyopathy: Secondary | ICD-10-CM | POA: Diagnosis not present

## 2016-04-28 DIAGNOSIS — Z95810 Presence of automatic (implantable) cardiac defibrillator: Secondary | ICD-10-CM | POA: Diagnosis not present

## 2016-04-28 LAB — BASIC METABOLIC PANEL
BUN: 24 mg/dL (ref 7–25)
CO2: 25 mmol/L (ref 20–31)
Calcium: 9.1 mg/dL (ref 8.6–10.3)
Chloride: 103 mmol/L (ref 98–110)
Creat: 1.31 mg/dL (ref 0.70–1.33)
Glucose, Bld: 86 mg/dL (ref 65–99)
Potassium: 5 mmol/L (ref 3.5–5.3)
Sodium: 140 mmol/L (ref 135–146)

## 2016-04-28 LAB — HEPATIC FUNCTION PANEL
ALT: 10 U/L (ref 9–46)
AST: 12 U/L (ref 10–35)
Albumin: 3.7 g/dL (ref 3.6–5.1)
Alkaline Phosphatase: 72 U/L (ref 40–115)
Bilirubin, Direct: 0.1 mg/dL (ref <=0.2)
Indirect Bilirubin: 0.4 mg/dL (ref 0.2–1.2)
Total Bilirubin: 0.5 mg/dL (ref 0.2–1.2)
Total Protein: 6 g/dL — ABNORMAL LOW (ref 6.1–8.1)

## 2016-04-28 LAB — LIPID PANEL
Cholesterol: 152 mg/dL (ref 125–200)
HDL: 40 mg/dL (ref >=40)
LDL Cholesterol: 81 mg/dL (ref <130)
Total CHOL/HDL Ratio: 3.8 Ratio (ref <=5.0)
Triglycerides: 157 mg/dL — ABNORMAL HIGH (ref <150)
VLDL: 31 mg/dL — ABNORMAL HIGH (ref <30)

## 2016-04-28 LAB — CBC
HCT: 44.2 % (ref 38.5–50.0)
Hemoglobin: 14.9 g/dL (ref 13.2–17.1)
MCH: 33 pg (ref 27.0–33.0)
MCHC: 33.7 g/dL (ref 32.0–36.0)
MCV: 98 fL (ref 80.0–100.0)
MPV: 9.7 fL (ref 7.5–12.5)
Platelets: 391 10*3/uL (ref 140–400)
RBC: 4.51 MIL/uL (ref 4.20–5.80)
RDW: 15.7 % — ABNORMAL HIGH (ref 11.0–15.0)
WBC: 8.4 10*3/uL (ref 3.8–10.8)

## 2016-04-28 MED ORDER — HYDRALAZINE HCL 25 MG PO TABS: 12.5000 mg | ORAL_TABLET | Freq: Two times a day (BID) | ORAL | Status: DC

## 2016-04-28 MED ORDER — METOPROLOL SUCCINATE ER 25 MG PO TB24: 25.0000 mg | ORAL_TABLET | Freq: Every day | ORAL | Status: DC

## 2016-04-28 MED ORDER — FUROSEMIDE 20 MG PO TABS: ORAL_TABLET | ORAL | Status: DC

## 2016-04-28 MED ORDER — LEVOTHYROXINE SODIUM 125 MCG PO TABS: 125.0000 ug | ORAL_TABLET | Freq: Every day | ORAL | Status: DC

## 2016-04-28 MED ORDER — CLOPIDOGREL BISULFATE 75 MG PO TABS: 75.0000 mg | ORAL_TABLET | Freq: Every day | ORAL | Status: DC

## 2016-04-28 MED ORDER — FAMOTIDINE 20 MG PO TABS: 20.0000 mg | ORAL_TABLET | Freq: Every day | ORAL | Status: DC

## 2016-04-28 MED ORDER — ALLOPURINOL 100 MG PO TABS: 100.0000 mg | ORAL_TABLET | Freq: Every day | ORAL | Status: DC

## 2016-04-28 MED ORDER — SIMVASTATIN 40 MG PO TABS: 40.0000 mg | ORAL_TABLET | Freq: Every day | ORAL | Status: DC

## 2016-04-28 MED ORDER — ISOSORBIDE MONONITRATE ER 30 MG PO TB24: 15.0000 mg | ORAL_TABLET | Freq: Every day | ORAL | Status: DC

## 2016-04-28 NOTE — Progress Notes (Signed)
CARDIOLOGY OFFICE NOTE  Date:  04/28/2016    Adam Gillespie Date of Birth: 25-Jun-1956 Medical Record #[id number]  PCP:  ROSE, Katrinka Blazing, NP  Cardiologist:  Margarito Courser    Chief Complaint  Patient presents with  . Cardiomyopathy  . Coronary Artery Disease  . Hyperlipidemia    4 month check - seen for [name]    History of Present Illness: Adam Gillespie is a 60 y.o. male who presents today for a 4 month check. Seen for [name]. Former patient of Dr. [name].   [name] has a hx of CAD, status post CABG in 2001, ischemic cardiomyopathy with an EF of 25%, status post AICD, hypothyroidism, HL, GERD, CKD, gout, hypothyroidism. 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 He has a tendency towards hyperkalemia and is not on an ACE inhibitor. Myoview 07/2014 low risk.   Seen back in March of 2016 by [name], PA - had progressive carotid disease on the right and was referred to [facility]. Now s/p stenting of the right carotid. He is on aspirin and Brilinta. Complicated by hypotension. Most of his cardiac medicines were stopped but we have been able to get him titrated back on up.   Last seen back in March and he was doing ok. Mom has been placed in SNF. Brother has passed. Niece is his POA and has moved in with him.  Comes back today. He is here with his niece who is his power of attorney. His mom has died since he was last here - that happened May 1st. Niece has just returned to her own home - she is married herself. [name] is doing less and less. He takes frequent rest periods. No garden this year. He tires more easily. He says he is not short of breath. She manages his diet/medicines. No shocks reported. Does not report being dizzy or having any chest pain.    Past Medical History  Diagnosis Date  . Anoxic encephalopathy (HCC)     POSTOP FROM 2001  . LV dysfunction     EF is 25% per echo in July 2013  . Carotid stenosis, right    60-79% right, 40 - 59% left - needs dopplers in June 2014  . Hyperlipidemia   . Gout   . Hypothyroidism   . Ischemic cardiomyopathy     Remote MI with CABG x 5 in 2001; Myocardial perfusion imaging EF 26% old infarct anterior wall. 04/2010  . 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 Hearing deficit     Bilateral ears  . 7366 lead 05/01/2014  . Automatic implantable cardiac defibrillator -Medtronic 04/23/2011    a. 07/2014 gen change -  Medtronic EVERA single chamber ICD, serial number  [id number] H.  . ACE inhibitor intolerance     Renal insufficiency/hyperkalemia  . Coronary artery disease   . Heart murmur   . CHF (congestive heart failure) (HCC)   . Sleep apnea 11-2014    Past Surgical History  Procedure Laterality Date  . Cardiac catheterization  08/05/2000    THERE IS ANTERIOR AND APICAL AKINESIS. EF IS ESTIMATED 20%, WITH INFEROBASILAR PORTIONS CONTRACTING REASONABLY WELL.  Marland Kitchen Coronary artery bypass graft      x5. lima to the lad, saphenous vein graft to the intermediate and obtuse mariginal, and a saphenous vein graft to the acute mariginal and distal right coronary artery  . Cardiac defibrillator placement    . Colonoscopy with propofol N/A 01/09/2013  Procedure: COLONOSCOPY WITH PROPOFOL;  Surgeon: Shirley Friar, MD;  Location: [facility] ENDOSCOPY;  Service: Endoscopy;  Laterality: N/A;  . Esophagogastroduodenoscopy N/A 07/11/2013    Procedure: ESOPHAGOGASTRODUODENOSCOPY (EGD);  Surgeon: Shirley Friar, MD;  Location: [facility] ENDOSCOPY;  Service: Endoscopy;  Laterality: N/A;  . Savory dilation N/A 07/11/2013    Procedure: SAVORY DILATION;  Surgeon: Shirley Friar, MD;  Location: [facility] ENDOSCOPY;  Service: Endoscopy;  Laterality: N/A;  . Balloon dilation N/A 07/11/2013    Procedure: BALLOON DILATION;  Surgeon: Shirley Friar, MD;  Location: [facility] ENDOSCOPY;  Service: Endoscopy;  Laterality: N/A;  . Esophageal dilation  2015  . Implantable cardioverter defibrillator (icd)  generator change N/A 07/31/2014    Procedure: ICD GENERATOR CHANGE;  Surgeon: Duke Salvia, MD;  Location: [facility] CATH LAB;  Service: Cardiovascular;  Laterality: N/A;  . Lead revision N/A 07/31/2014    Procedure: LEAD REVISION;  Surgeon: Duke Salvia, MD;  Location: [facility] CATH LAB;  Service: Cardiovascular;  Laterality: N/A;  . Tonsillectomy    . Adenoidectomy    . Peripheral vascular catheterization N/A 05/21/2015    Procedure: Carotid PTA/Stent Intervention;  Surgeon: Sherren Kerns, MD;  Location: [facility] INVASIVE CV LAB;  Service: Cardiovascular;  Laterality: N/A;     Medications: Current Outpatient Prescriptions  Medication Sig Dispense Refill  . allopurinol (ZYLOPRIM) 100 MG tablet Take 1 tablet (100 mg total) by mouth daily. 90 tablet 3  . aspirin 81 MG tablet Take 81 mg by mouth daily.      . clopidogrel (PLAVIX) 75 MG tablet Take 1 tablet (75 mg total) by mouth daily. 90 tablet 3  . famotidine (PEPCID) 20 MG tablet Take 1 tablet (20 mg total) by mouth daily. 90 tablet 3  . furosemide (LASIX) 20 MG tablet Take 1 tablet by mouth 3  times weekly Tuesday,  Thursday and Saturday 40 tablet 3  . hydrALAZINE (APRESOLINE) 25 MG tablet Take 0.5 tablets (12.5 mg total) by mouth 2 (two) times daily. 90 tablet 3  . isosorbide mononitrate (IMDUR) 30 MG 24 hr tablet Take 0.5 tablets (15 mg total) by mouth daily. 90 tablet 3  . levothyroxine (SYNTHROID, LEVOTHROID) 125 MCG tablet Take 1 tablet (125 mcg total) by mouth daily. 90 tablet 3  . metoprolol succinate (TOPROL-XL) 25 MG 24 hr tablet Take 1 tablet (25 mg total) by mouth daily. 90 tablet 3  . oxybutynin (DITROPAN-XL) 10 MG 24 hr tablet Take 10 mg by mouth daily.    . simvastatin (ZOCOR) 40 MG tablet Take 1 tablet (40 mg total) by mouth at bedtime. 90 tablet 3  . tamsulosin (FLOMAX) 0.4 MG CAPS capsule Take 0.4 mg by mouth at bedtime.     No current facility-administered medications for this visit.    Allergies: Allergies  Allergen Reactions    . Ace Inhibitors     Contributed to hyperkalemia and renal insufficiency in the past (see office note 10/03/2012)    Social History: The patient  reports that he has never smoked. His smokeless tobacco use includes Chew. He reports that he does not drink alcohol or use illicit drugs.   Family History: The patient's family history includes Asthma in his sister; Cancer in his brother and father; Emphysema in his father and sister; Heart attack in his maternal grandfather and mother; Stroke in his maternal grandmother.   Review of Systems: Please see the history of present illness.   Otherwise, the review of systems is positive for none.  All other systems are reviewed and negative.   Physical Exam: VS:  BP 110/70 mmHg  Pulse 77  Ht 5\' 4"  (1.626 m)  Wt 135 lb 1.9 oz (61.29 kg)  BMI 23.18 kg/m2  SpO2 98% .  BMI Body mass index is 23.18 kg/(m^2).  Wt Readings from Last 3 Encounters:  04/28/16 135 lb 1.9 oz (61.29 kg)  12/31/15 138 lb 1.9 oz (62.651 kg)  12/25/15 139 lb (63.05 kg)    General: Pleasant. He is a little slow but alert and in no acute distress. Weight is down a few pounds. HEENT: Normal.Hard of hearing. Neck: Supple, no JVD, carotid bruits, or masses noted.  Cardiac: Regular rate and rhythm. No murmurs, rubs, or gallops. No edema.  Respiratory:  Lungs are clear to auscultation bilaterally with normal work of breathing.  GI: Soft and nontender.  MS: No deformity or atrophy. Gait and ROM intact. Skin: Warm and dry. Color is normal.  Neuro:  Strength and sensation are intact and no gross focal deficits noted.  Psych: Alert, appropriate and with normal affect.   LABORATORY DATA:  EKG:  EKG is not ordered today.  Lab Results  Component Value Date   WBC 14.4* 05/21/2015   HGB 14.7 05/21/2015   HCT 42.3 05/21/2015   PLT 321 05/21/2015   GLUCOSE 87 12/31/2015   CHOL 153 12/31/2015   TRIG 146 12/31/2015   HDL 42 12/31/2015   LDLDIRECT 87.5 06/03/2014   LDLCALC  82 12/31/2015   ALT 9 12/31/2015   AST 12 12/31/2015   NA 142 12/31/2015   K 5.6* 12/31/2015   CL 109 12/31/2015   CREATININE 1.22 12/31/2015   BUN 16 12/31/2015   CO2 25 12/31/2015   TSH 1.00 12/31/2015   INR 1.05 02/14/2013   HGBA1C 5.8* 02/15/2013    BNP (last 3 results) No results for input(s): BNP in the last 8760 hours.  ProBNP (last 3 results) No results for input(s): PROBNP in the last 8760 hours.   Other Studies Reviewed Today:  Myoview 08/11/14 Low risk stress nuclear study .  There is evidence of a previous anteriorlateral MI that is old. No ischemia LV Ejection Fraction: 29%. LV Wall Motion: akinesis / severe hypokinesis of the anterior lateral wall.  Echo 7/13 - EF 25% with akinesis of theinferior,inferoseptal, distal anterior and apical walls; hypookinesis elsewhere. . - Aortic valve: Mild regurgitation. - Mitral valve: Mild regurgitation.  Assessment/Plan:  Coronary artery disease involving native coronary artery of native heart without angina pectoris No angina. Would manage medically. I have left him on his current regimen.   Ischemic cardiomyopathy He is not a candidate for ACE inhibitor given prior history of hyperkalemia. Back on low doses of Imdur and Hydralazine. I have left him on his current regimen. Meds refilled today.   Chronic systolic congestive heart failure He is NYHA 2/3 - would continue with supportive/conservative care.   Hyperlipemia Continue statin. Needs labs today  Automatic implantable cardioverter-defibrillator in situ Follow up with EP as planned.  Carotid stenosis, bilateral  S/p arch and right carotid angiogram with right carotid angioplasty and stenting - he is now on Plavix. Followed by Dr. Darrick Penna.   CKD - labs today  Current medicines are reviewed with the patient today.  The patient does not have concerns regarding medicines other than what has been noted above.  The following changes have been made:  See  above.  Labs/ tests ordered today include:    Orders Placed This Encounter  Procedures  . Basic metabolic panel  . CBC  . Hepatic function panel  . Lipid panel     Disposition:   Overall, he seems to be holding his own. FU with [name] in 4 months. I will see him back in March of 2018.   Patient is agreeable to this plan and will call if any problems develop in the interim.   Signed: [name], RN, ANP-C 04/28/2016 10:28 AM  [facility] [address] [place], [place]  [address] Phone: [phone] Fax: [phone]

## 2016-04-28 NOTE — Patient Instructions (Addendum)
We will be checking the following labs today - BMET, CBC, HPF and lipids Medication Instructions:    Continue with your current medicines.   I have sent in all your refills today.     Testing/Procedures To Be Arranged:  N/A  Follow-Up:   See [name] in November; see me in March of 2018    Other Special Instructions:   N/A    If you need a refill on your cardiac medications before your next appointment, please call your pharmacy.   Call the [facility] office at [phone] if you have any questions, problems or concerns.

## 2016-04-30 ENCOUNTER — Telehealth: Payer: Self-pay | Admitting: Nurse Practitioner

## 2016-04-30 NOTE — Telephone Encounter (Signed)
New message ° ° ° ° °Returning a call to the nurse °

## 2016-05-25 ENCOUNTER — Ambulatory Visit (INDEPENDENT_AMBULATORY_CARE_PROVIDER_SITE_OTHER): Payer: Medicare Other | Admitting: *Deleted

## 2016-05-25 DIAGNOSIS — Z95810 Presence of automatic (implantable) cardiac defibrillator: Secondary | ICD-10-CM

## 2016-05-25 DIAGNOSIS — I255 Ischemic cardiomyopathy: Secondary | ICD-10-CM | POA: Diagnosis not present

## 2016-05-25 NOTE — Progress Notes (Signed)
Remote ICD transmission.   

## 2016-05-26 ENCOUNTER — Encounter: Payer: Self-pay | Admitting: Cardiology

## 2016-05-31 LAB — CUP PACEART REMOTE DEVICE CHECK
HighPow Impedance: 68 Ohm
Implantable Lead Implant Date: 20151014
Implantable Lead Location: 753860
Lead Channel Impedance Value: 399 Ohm
Lead Channel Pacing Threshold Amplitude: 0.875 V
Lead Channel Pacing Threshold Pulse Width: 0.4 ms
Lead Channel Sensing Intrinsic Amplitude: 5.125 mV
Lead Channel Setting Sensing Sensitivity: 0.3 mV
MDC IDC MSMT BATTERY REMAINING LONGEVITY: 127 mo
MDC IDC MSMT BATTERY VOLTAGE: 3.02 V
MDC IDC MSMT LEADCHNL RV IMPEDANCE VALUE: 323 Ohm
MDC IDC MSMT LEADCHNL RV SENSING INTR AMPL: 5.125 mV
MDC IDC SESS DTM: 20170808052304
MDC IDC SET LEADCHNL RV PACING AMPLITUDE: 2.5 V
MDC IDC SET LEADCHNL RV PACING PULSEWIDTH: 0.4 ms
MDC IDC STAT BRADY RV PERCENT PACED: 0.05 %

## 2016-06-25 ENCOUNTER — Encounter: Payer: Self-pay | Admitting: Family

## 2016-07-01 ENCOUNTER — Ambulatory Visit (HOSPITAL_COMMUNITY)
Admission: RE | Admit: 2016-07-01 | Discharge: 2016-07-01 | Disposition: A | Discharge status: Home / Self Care | Payer: Medicare Other | Source: Ambulatory Visit | Attending: Family | Admitting: Family

## 2016-07-01 ENCOUNTER — Ambulatory Visit (INDEPENDENT_AMBULATORY_CARE_PROVIDER_SITE_OTHER): Payer: Medicare Other | Admitting: Family

## 2016-07-01 VITALS — BP 103/67 | HR 77 | Temp 98.5°F | Resp 16 | Ht 64.0 in | Wt 138.0 lb

## 2016-07-01 DIAGNOSIS — I6523 Occlusion and stenosis of bilateral carotid arteries: Secondary | ICD-10-CM | POA: Insufficient documentation

## 2016-07-01 DIAGNOSIS — Z959 Presence of cardiac and vascular implant and graft, unspecified: Secondary | ICD-10-CM | POA: Diagnosis not present

## 2016-07-01 NOTE — Patient Instructions (Signed)
Stroke Prevention Some medical conditions and behaviors are associated with an increased chance of having a stroke. You may prevent a stroke by making healthy choices and managing medical conditions. HOW CAN I REDUCE MY RISK OF HAVING A STROKE?   Stay physically active. Get at least 30 minutes of activity on most or all days.  Do not smoke. It may also be helpful to avoid exposure to secondhand smoke.  Limit alcohol use. Moderate alcohol use is considered to be:  No more than 2 drinks per day for men.  No more than 1 drink per day for nonpregnant women.  Eat healthy foods. This involves:  Eating 5 or more servings of fruits and vegetables a day.  Making dietary changes that address high blood pressure (hypertension), high cholesterol, diabetes, or obesity.  Manage your cholesterol levels.  Making food choices that are high in fiber and low in saturated fat, trans fat, and cholesterol may control cholesterol levels.  Take any prescribed medicines to control cholesterol as directed by your health care provider.  Manage your diabetes.  Controlling your carbohydrate and sugar intake is recommended to manage diabetes.  Take any prescribed medicines to control diabetes as directed by your health care provider.  Control your hypertension.  Making food choices that are low in salt (sodium), saturated fat, trans fat, and cholesterol is recommended to manage hypertension.  Ask your health care provider if you need treatment to lower your blood pressure. Take any prescribed medicines to control hypertension as directed by your health care provider.  If you are 18-39 years of age, have your blood pressure checked every 3-5 years. If you are 40 years of age or older, have your blood pressure checked every year.  Maintain a healthy weight.  Reducing calorie intake and making food choices that are low in sodium, saturated fat, trans fat, and cholesterol are recommended to manage  weight.  Stop drug abuse.  Avoid taking birth control pills.  Talk to your health care provider about the risks of taking birth control pills if you are over 35 years old, smoke, get migraines, or have ever had a blood clot.  Get evaluated for sleep disorders (sleep apnea).  Talk to your health care provider about getting a sleep evaluation if you snore a lot or have excessive sleepiness.  Take medicines only as directed by your health care provider.  For some people, aspirin or blood thinners (anticoagulants) are helpful in reducing the risk of forming abnormal blood clots that can lead to stroke. If you have the irregular heart rhythm of atrial fibrillation, you should be on a blood thinner unless there is a good reason you cannot take them.  Understand all your medicine instructions.  Make sure that other conditions (such as anemia or atherosclerosis) are addressed. SEEK IMMEDIATE MEDICAL CARE IF:   You have sudden weakness or numbness of the face, arm, or leg, especially on one side of the body.  Your face or eyelid droops to one side.  You have sudden confusion.  You have trouble speaking (aphasia) or understanding.  You have sudden trouble seeing in one or both eyes.  You have sudden trouble walking.  You have dizziness.  You have a loss of balance or coordination.  You have a sudden, severe headache with no known cause.  You have new chest pain or an irregular heartbeat. Any of these symptoms may represent a serious problem that is an emergency. Do not wait to see if the symptoms will   go away. Get medical help at once. Call your local emergency services (911 in [place] Do not drive yourself to the hospital.   This information is not intended to replace advice given to you by your health care provider. Make sure you discuss any questions you have with your health care provider.   Document Released: 11/11/2004 Document Revised: 10/25/2014 Document Reviewed:  04/06/2013 Elsevier Interactive Patient Education 2016 Elsevier Inc.  

## 2016-07-01 NOTE — Progress Notes (Signed)
Chief Complaint: Follow up Extracranial Carotid Artery Stenosis   History of Present Illness  Adam Gillespie is a 60 y.o. male patient of Dr. Darrick Penna who presents for follow-up after right carotid stent on 05/21/15.   Pt had a large MI at age 23 with 5 vessel CABG and pacer/defib.at that time.  The patient denies any history of TIA or stroke symptoms, specifically the patient denies a history of amaurosis fugax or monocular blindness, denies a history unilateral  of facial drooping, denies a history of hemiplegia, and denies a history of receptive or expressive aphasia.    The patient denies New Medical or Surgical History.  Niece states there is a strong family hx of stroke, MI, peripheral vascular disease, including at young ages.   Pt Diabetic: no Pt smoker: non-smoker  Pt meds include: Statin : yes ASA: yes Other anticoagulants/antiplatelets: Plavix   Past Medical History:  Diagnosis Date  . 1610 lead 05/01/2014  . ACE inhibitor intolerance    Renal insufficiency/hyperkalemia  . Anoxic encephalopathy (HCC)    POSTOP FROM 2001  . Automatic implantable cardiac defibrillator -Medtronic 04/23/2011   a. 07/2014 gen change -  Medtronic EVERA single chamber ICD, serial number  [id number].  . Carotid stenosis, right    60-79% right, 40 - 59% left - needs dopplers in June 2014  . CHF (congestive heart failure) (HCC)   . Coronary artery disease   . Gout   . Hearing deficit    Bilateral ears  . Heart murmur   . Hyperlipidemia   . Hypothyroidism   . Ischemic cardiomyopathy    Remote MI with CABG x 5 in 2001; Myocardial perfusion imaging EF 26% old infarct anterior wall. 04/2010  . LV dysfunction    EF is 25% per echo in July 2013  . 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 Sleep apnea 11-2014    Social History Social History  Substance Use Topics  . Smoking status: Never Smoker  . Smokeless tobacco: Current User    Types: Chew     Comment: snuff  . Alcohol  use No    Family History Family History  Problem Relation Age of Onset  . Heart attack Mother   . Cancer Father   . Cancer Brother   . Asthma Sister   . Emphysema Father   . Emphysema Sister   . Heart attack Maternal Grandfather   . Stroke Maternal Grandmother     Surgical History Past Surgical History:  Procedure Laterality Date  . ADENOIDECTOMY    . BALLOON DILATION N/A 07/11/2013   Procedure: BALLOON DILATION;  Surgeon: Shirley Friar, MD;  Location: [facility] ENDOSCOPY;  Service: Endoscopy;  Laterality: N/A;  . CARDIAC CATHETERIZATION  08/05/2000   THERE IS ANTERIOR AND APICAL AKINESIS. EF IS ESTIMATED 20%, WITH INFEROBASILAR PORTIONS CONTRACTING REASONABLY WELL.  Marland Kitchen CARDIAC DEFIBRILLATOR PLACEMENT    . COLONOSCOPY WITH PROPOFOL N/A 01/09/2013   Procedure: COLONOSCOPY WITH PROPOFOL;  Surgeon: Shirley Friar, MD;  Location: [facility] ENDOSCOPY;  Service: Endoscopy;  Laterality: N/A;  . CORONARY ARTERY BYPASS GRAFT     x5. lima to the lad, saphenous vein graft to the intermediate and obtuse mariginal, and a saphenous vein graft to the acute mariginal and distal right coronary artery  . ESOPHAGEAL DILATION  2015  . ESOPHAGOGASTRODUODENOSCOPY N/A 07/11/2013   Procedure: ESOPHAGOGASTRODUODENOSCOPY (EGD);  Surgeon: Shirley Friar, MD;  Location: [facility] ENDOSCOPY;  Service: Endoscopy;  Laterality: N/A;  . IMPLANTABLE CARDIOVERTER DEFIBRILLATOR (  ICD) GENERATOR CHANGE N/A 07/31/2014   Procedure: ICD GENERATOR CHANGE;  Surgeon: Duke Salvia, MD;  Location: [facility] CATH LAB;  Service: Cardiovascular;  Laterality: N/A;  . LEAD REVISION N/A 07/31/2014   Procedure: LEAD REVISION;  Surgeon: Duke Salvia, MD;  Location: [facility] CATH LAB;  Service: Cardiovascular;  Laterality: N/A;  . PERIPHERAL VASCULAR CATHETERIZATION N/A 05/21/2015   Procedure: Carotid PTA/Stent Intervention;  Surgeon: Sherren Kerns, MD;  Location: [facility] INVASIVE CV LAB;  Service: Cardiovascular;  Laterality: N/A;  . SAVORY DILATION N/A  07/11/2013   Procedure: SAVORY DILATION;  Surgeon: Shirley Friar, MD;  Location: [facility] ENDOSCOPY;  Service: Endoscopy;  Laterality: N/A;  . TONSILLECTOMY      Allergies  Allergen Reactions  . Ace Inhibitors     Contributed to hyperkalemia and renal insufficiency in the past (see office note 10/03/2012)    Current Outpatient Prescriptions  Medication Sig Dispense Refill  . allopurinol (ZYLOPRIM) 100 MG tablet Take 1 tablet (100 mg total) by mouth daily. 90 tablet 3  . aspirin 81 MG tablet Take 81 mg by mouth daily.      . clopidogrel (PLAVIX) 75 MG tablet Take 1 tablet (75 mg total) by mouth daily. 90 tablet 3  . famotidine (PEPCID) 20 MG tablet Take 1 tablet (20 mg total) by mouth daily. 90 tablet 3  . furosemide (LASIX) 20 MG tablet Take 1 tablet by mouth 3  times weekly Tuesday,  Thursday and Saturday 40 tablet 3  . hydrALAZINE (APRESOLINE) 25 MG tablet Take 0.5 tablets (12.5 mg total) by mouth 2 (two) times daily. 90 tablet 3  . isosorbide mononitrate (IMDUR) 30 MG 24 hr tablet Take 0.5 tablets (15 mg total) by mouth daily. 90 tablet 3  . levothyroxine (SYNTHROID, LEVOTHROID) 125 MCG tablet Take 1 tablet (125 mcg total) by mouth daily. 90 tablet 3  . metoprolol succinate (TOPROL-XL) 25 MG 24 hr tablet Take 1 tablet (25 mg total) by mouth daily. 90 tablet 3  . oxybutynin (DITROPAN-XL) 10 MG 24 hr tablet Take 10 mg by mouth daily.    . simvastatin (ZOCOR) 40 MG tablet Take 1 tablet (40 mg total) by mouth at bedtime. 90 tablet 3  . tamsulosin (FLOMAX) 0.4 MG CAPS capsule Take 0.4 mg by mouth at bedtime.     No current facility-administered medications for this visit.     Review of Systems : See HPI for pertinent positives and negatives.  Physical Examination  Vitals:   07/01/16 1000 07/01/16 1003  BP: 112/66 103/67  Pulse: 78 77  Resp: 16   Temp: 98.5 F (36.9 C)   SpO2: 98%   Weight: 138 lb (62.6 kg)   Height: 5\' 4"  (1.626 m)    Body mass index is 23.69  kg/m.  General: WDWN male in NAD GAIT: normal Eyes: PERRLA Pulmonary:  Respirations are non-labored, good air movement, CTAB  Cardiac: regular rhythm, no detected murmur. Pacemaker/defibrillator situated at left upper chest, subcutaneous.  VASCULAR EXAM Carotid Bruits Right Left   Negative Negative    Aorta is not palpable. Radial pulses are 2+ palpable and equal.  LE Pulses Right Left       POPLITEAL  not palpable   not palpable       POSTERIOR TIBIAL   palpable    palpable        DORSALIS PEDIS      ANTERIOR TIBIAL  palpable   palpable     Gastrointestinal: soft, nontender, BS WNL, no r/g, no palpable masses.  Musculoskeletal: no muscle atrophy/wasting. M/S 5/5 throughout, extremities without ischemic changes.  Neurologic: A&O X 3; Appropriate Affect, Speech is normal CN 2-12 intact except is hard of hearing, pain and light touch intact in extremities, Motor exam as listed above.    Assessment: Claiborne BillingsBruce R Dingledine is a 60 y.o. male who is s/p right carotid stent on 05/21/15. He has no history of stroke or TIA, but did have an MI at age 60 with 5 vessel CABG, has a Facilities managerpacemaker/defibrillator.  He does not have DM and has never used tobacco. He has a strong family hx of CAD, PAD, some at early ages.  DATA Today's carotid duplex suggests a patent right ICA stent with no restenosis within the stent.  Distal (post stent) right ICA stenosis of 40-59%. Distal left ICA stenosis of 40-59%. Distal left CCA stenosis of >50% which extends into the ECA.  Increased distal ICA stenosis bilaterally compared to previous study. Left distal CCA disease.     Plan: Follow-up in 6 months with Carotid Duplex scan.   I discussed in depth with the patient the nature of atherosclerosis, and emphasized the importance of maximal medical management including strict control  of blood pressure, blood glucose, and lipid levels, obtaining regular exercise, and continued cessation of smoking.  The patient is aware that without maximal medical management the underlying atherosclerotic disease process will progress, limiting the benefit of any interventions. The patient was given information about stroke prevention and what symptoms should prompt the patient to seek immediate medical care. Thank you for allowing us to participate in this patient's care.  Charisse MarchSuzanne Nickel, RN, MSN, FNP-C [facility] Office: [phone]  Clinic Physician: Darrick PennaFields  07/01/16 10:25 AM

## 2016-09-22 NOTE — Addendum Note (Signed)
Addended by: Burton Apley A on: 09/22/2016 12:59 PM   Modules accepted: Orders

## 2016-12-09 ENCOUNTER — Encounter: Payer: Self-pay | Admitting: Cardiology

## 2016-12-22 ENCOUNTER — Ambulatory Visit: Payer: Medicare Other | Admitting: Nurse Practitioner

## 2016-12-27 NOTE — Progress Notes (Deleted)
CARDIOLOGY OFFICE NOTE  Date:  12/28/2016    [name] Date of Birth: [birth year] Medical Record #[id number]  PCP:  ROSE, Katrinka Blazing, NP  Cardiologist:  [name] & ***    No chief complaint on file.   History of Present Illness: Adam Gillespie is a 61 y.o. male who presents today for a *** Seen for [name]. Former patient of Dr. [name].   Yochanan has a hx of CAD, status post CABG in 2001, ischemic cardiomyopathy with an EF of 25%, status post AICD, hypothyroidism, HL, GERD, CKD, gout, hypothyroidism. 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 He has a tendency towards hyperkalemia and is not on an ACE inhibitor. Myoview 07/2014 low risk.   Seen back in March of 2016 by [name], PA - had progressive carotid disease on the right and was referred to [facility]. Now s/p stenting of the right carotid. He is on aspirin and Brilinta. Complicated by hypotension. Most of his cardiac medicines were stopped but we have been able to get him titrated back on up.   Last seen back in March and he was doing ok. Mom has been placed in SNF. Brother has passed. Niece is his POA and has moved in with him.  Comes back today. He is here with his niece who is his power of attorney. His mom has died since he was last here - that happened May 1st. Niece has just returned to her own home - she is married herself. Maxx is doing less and less. He takes frequent rest periods. No garden this year. He tires more easily. He says he is not short of breath. She manages his diet/medicines. No shocks reported. Does not report being dizzy or having any chest pain.   Comes in today. Here with   Past Medical History:  Diagnosis Date  . 5621 lead 05/01/2014  . ACE inhibitor intolerance    Renal insufficiency/hyperkalemia  . Anoxic encephalopathy (HCC)    POSTOP FROM 2001  . Automatic implantable cardiac defibrillator -Medtronic 04/23/2011   a. 07/2014 gen change -  Medtronic  EVERA single chamber ICD, serial number  [id number].  . Carotid stenosis, right    60-79% right, 40 - 59% left - needs dopplers in June 2014  . CHF (congestive heart failure) (HCC)   . Coronary artery disease   . Gout   . Hearing deficit    Bilateral ears  . Heart murmur   . Hyperlipidemia   . Hypothyroidism   . Ischemic cardiomyopathy    Remote MI with CABG x 5 in 2001; Myocardial perfusion imaging EF 26% old infarct anterior wall. 04/2010  . LV dysfunction    EF is 25% per echo in July 2013  . 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 Sleep apnea 11-2014    Past Surgical History:  Procedure Laterality Date  . ADENOIDECTOMY    . BALLOON DILATION N/A 07/11/2013   Procedure: BALLOON DILATION;  Surgeon: Shirley Friar, MD;  Location: [facility] ENDOSCOPY;  Service: Endoscopy;  Laterality: N/A;  . CARDIAC CATHETERIZATION  08/05/2000   THERE IS ANTERIOR AND APICAL AKINESIS. EF IS ESTIMATED 20%, WITH INFEROBASILAR PORTIONS CONTRACTING REASONABLY WELL.  Marland Kitchen CARDIAC DEFIBRILLATOR PLACEMENT    . COLONOSCOPY WITH PROPOFOL N/A 01/09/2013   Procedure: COLONOSCOPY WITH PROPOFOL;  Surgeon: Shirley Friar, MD;  Location: [facility] ENDOSCOPY;  Service: Endoscopy;  Laterality: N/A;  . CORONARY ARTERY BYPASS GRAFT     x5.  lima to the lad, saphenous vein graft to the intermediate and obtuse mariginal, and a saphenous vein graft to the acute mariginal and distal right coronary artery  . ESOPHAGEAL DILATION  2015  . ESOPHAGOGASTRODUODENOSCOPY N/A 07/11/2013   Procedure: ESOPHAGOGASTRODUODENOSCOPY (EGD);  Surgeon: Shirley Friar, MD;  Location: [facility] ENDOSCOPY;  Service: Endoscopy;  Laterality: N/A;  . IMPLANTABLE CARDIOVERTER DEFIBRILLATOR (ICD) GENERATOR CHANGE N/A 07/31/2014   Procedure: ICD GENERATOR CHANGE;  Surgeon: Duke Salvia, MD;  Location: [facility] CATH LAB;  Service: Cardiovascular;  Laterality: N/A;  . LEAD REVISION N/A 07/31/2014   Procedure: LEAD REVISION;  Surgeon: Duke Salvia, MD;   Location: [facility] CATH LAB;  Service: Cardiovascular;  Laterality: N/A;  . PERIPHERAL VASCULAR CATHETERIZATION N/A 05/21/2015   Procedure: Carotid PTA/Stent Intervention;  Surgeon: Sherren Kerns, MD;  Location: [facility] INVASIVE CV LAB;  Service: Cardiovascular;  Laterality: N/A;  . SAVORY DILATION N/A 07/11/2013   Procedure: SAVORY DILATION;  Surgeon: Shirley Friar, MD;  Location: [facility] ENDOSCOPY;  Service: Endoscopy;  Laterality: N/A;  . TONSILLECTOMY       Medications: Current Outpatient Prescriptions  Medication Sig Dispense Refill  . allopurinol (ZYLOPRIM) 100 MG tablet Take 1 tablet (100 mg total) by mouth daily. 90 tablet 3  . aspirin 81 MG tablet Take 81 mg by mouth daily.      . clopidogrel (PLAVIX) 75 MG tablet Take 1 tablet (75 mg total) by mouth daily. 90 tablet 3  . famotidine (PEPCID) 20 MG tablet Take 1 tablet (20 mg total) by mouth daily. 90 tablet 3  . furosemide (LASIX) 20 MG tablet Take 1 tablet by mouth 3  times weekly Tuesday,  Thursday and Saturday 40 tablet 3  . hydrALAZINE (APRESOLINE) 25 MG tablet Take 0.5 tablets (12.5 mg total) by mouth 2 (two) times daily. 90 tablet 3  . isosorbide mononitrate (IMDUR) 30 MG 24 hr tablet Take 0.5 tablets (15 mg total) by mouth daily. 90 tablet 3  . levothyroxine (SYNTHROID, LEVOTHROID) 125 MCG tablet Take 1 tablet (125 mcg total) by mouth daily. 90 tablet 3  . metoprolol succinate (TOPROL-XL) 25 MG 24 hr tablet Take 1 tablet (25 mg total) by mouth daily. 90 tablet 3  . oxybutynin (DITROPAN-XL) 10 MG 24 hr tablet Take 10 mg by mouth daily.    . simvastatin (ZOCOR) 40 MG tablet Take 1 tablet (40 mg total) by mouth at bedtime. 90 tablet 3  . tamsulosin (FLOMAX) 0.4 MG CAPS capsule Take 0.4 mg by mouth at bedtime.     No current facility-administered medications for this visit.     Allergies: Allergies  Allergen Reactions  . Ace Inhibitors     Contributed to hyperkalemia and renal insufficiency in the past (see office note 10/03/2012)      Social History: The patient  reports that he has never smoked. His smokeless tobacco use includes Chew. He reports that he does not drink alcohol or use drugs.   Family History: The patient's ***family history includes Asthma in his sister; Cancer in his brother and father; Emphysema in his father and sister; Heart attack in his maternal grandfather and mother; Stroke in his maternal grandmother.   Review of Systems: Please see the history of present illness.   Otherwise, the review of systems is positive for {NONE DEFAULTED:18576::"none"}.   All other systems are reviewed and negative.   Physical Exam: VS:  There were no vitals taken for this visit. Marland Kitchen  BMI There is no height or weight  on file to calculate BMI.  Wt Readings from Last 3 Encounters:  07/01/16 138 lb (62.6 kg)  04/28/16 135 lb 1.9 oz (61.3 kg)  12/31/15 138 lb 1.9 oz (62.7 kg)    General: Pleasant. Well developed, well nourished and in no acute distress.   HEENT: Normal.  Neck: Supple, no JVD, carotid bruits, or masses noted.  Cardiac: ***Regular rate and rhythm. No murmurs, rubs, or gallops. No edema.  Respiratory:  Lungs are clear to auscultation bilaterally with normal work of breathing.  GI: Soft and nontender.  MS: No deformity or atrophy. Gait and ROM intact.  Skin: Warm and dry. Color is normal.  Neuro:  Strength and sensation are intact and no gross focal deficits noted.  Psych: Alert, appropriate and with normal affect.   LABORATORY DATA:  EKG:  EKG {ACTION; IS/IS UEA:[id number]} ordered today. This demonstrates ***.  Lab Results  Component Value Date   WBC 8.4 04/28/2016   HGB 14.9 04/28/2016   HCT 44.2 04/28/2016   PLT 391 04/28/2016   GLUCOSE 86 04/28/2016   CHOL 152 04/28/2016   TRIG 157 (H) 04/28/2016   HDL 40 04/28/2016   LDLDIRECT 87.5 06/03/2014   LDLCALC 81 04/28/2016   ALT 10 04/28/2016   AST 12 04/28/2016   NA 140 04/28/2016   K 5.0 04/28/2016   CL 103 04/28/2016   CREATININE  1.31 04/28/2016   BUN 24 04/28/2016   CO2 25 04/28/2016   TSH 1.00 12/31/2015   INR 1.05 02/14/2013   HGBA1C 5.8 (H) 02/15/2013    BNP (last 3 results) No results for input(s): BNP in the last 8760 hours.  ProBNP (last 3 results) No results for input(s): PROBNP in the last 8760 hours.   Other Studies Reviewed Today:  Myoview 08-18-2014 Low risk stress nuclear study .  There is evidence of a previous anteriorlateral MI that is old. No ischemia LV Ejection Fraction: 29%. LV Wall Motion: akinesis / severe hypokinesis of the anterior lateral wall.  Echo 7/13 - EF 25% with akinesis of theinferior,inferoseptal, distal anterior and apical walls; hypookinesis elsewhere. . - Aortic valve: Mild regurgitation. - Mitral valve: Mild regurgitation.  Assessment/Plan:  Coronary artery disease involving native coronary artery of native heart without angina pectoris No angina. Would manage medically. I have left him on his current regimen.   Ischemic cardiomyopathy He is not a candidate for ACE inhibitor given prior history of hyperkalemia. Back on low doses of Imdur and Hydralazine. I have left him on his current regimen. Meds refilled today.   Chronic systolic congestive heart failure He is NYHA 2/3 - would continue with supportive/conservative care.   Hyperlipemia Continue statin. Needs labs today  Automatic implantable cardioverter-defibrillator in situ Follow up with EP as planned.  Carotid stenosis, bilateral  S/p arch and right carotid angiogram with right carotid angioplasty and stenting - he is now on Plavix. Followed by Dr. Darrick Penna.   CKD - labs today  Current medicines are reviewed with the patient today.  The patient does not have concerns regarding medicines other than what has been noted above.  The following changes have been made:  See above.  Labs/ tests ordered today include:   No orders of the defined types were placed in this  encounter.    Disposition:   FU with *** in {gen number 1-47:829562} {Days to years:10300}.   Patient is agreeable to this plan and will call if any problems develop in the interim.   Signed: [name], NP  12/28/2016 12:47 PM  [facility] [address] [place], [place]  [address] Phone: [phone] Fax: [phone]

## 2016-12-28 ENCOUNTER — Emergency Department (HOSPITAL_COMMUNITY): Payer: Medicare Other

## 2016-12-28 ENCOUNTER — Encounter (HOSPITAL_COMMUNITY): Payer: Self-pay | Admitting: Emergency Medicine

## 2016-12-28 ENCOUNTER — Inpatient Hospital Stay (HOSPITAL_COMMUNITY)
Admission: EM | Admit: 2016-12-28 | Discharge: 2017-01-02 | DRG: 071 | Disposition: A | Discharge status: Home / Self Care | Payer: Medicare Other | Attending: Family Medicine | Admitting: Family Medicine

## 2016-12-28 ENCOUNTER — Ambulatory Visit: Payer: Medicare Other | Admitting: Nurse Practitioner

## 2016-12-28 DIAGNOSIS — G4739 Other sleep apnea: Secondary | ICD-10-CM

## 2016-12-28 DIAGNOSIS — D72829 Elevated white blood cell count, unspecified: Secondary | ICD-10-CM

## 2016-12-28 DIAGNOSIS — E785 Hyperlipidemia, unspecified: Secondary | ICD-10-CM | POA: Diagnosis present

## 2016-12-28 DIAGNOSIS — G9340 Encephalopathy, unspecified: Secondary | ICD-10-CM | POA: Diagnosis present

## 2016-12-28 DIAGNOSIS — G4730 Sleep apnea, unspecified: Secondary | ICD-10-CM | POA: Diagnosis present

## 2016-12-28 DIAGNOSIS — Z8249 Family history of ischemic heart disease and other diseases of the circulatory system: Secondary | ICD-10-CM

## 2016-12-28 DIAGNOSIS — Z825 Family history of asthma and other chronic lower respiratory diseases: Secondary | ICD-10-CM

## 2016-12-28 DIAGNOSIS — Z95810 Presence of automatic (implantable) cardiac defibrillator: Secondary | ICD-10-CM

## 2016-12-28 DIAGNOSIS — I255 Ischemic cardiomyopathy: Secondary | ICD-10-CM | POA: Diagnosis present

## 2016-12-28 DIAGNOSIS — T82110A Breakdown (mechanical) of cardiac electrode, initial encounter: Secondary | ICD-10-CM | POA: Diagnosis present

## 2016-12-28 DIAGNOSIS — G4731 Primary central sleep apnea: Secondary | ICD-10-CM

## 2016-12-28 DIAGNOSIS — R1310 Dysphagia, unspecified: Secondary | ICD-10-CM | POA: Diagnosis present

## 2016-12-28 DIAGNOSIS — I509 Heart failure, unspecified: Secondary | ICD-10-CM | POA: Diagnosis present

## 2016-12-28 DIAGNOSIS — K219 Gastro-esophageal reflux disease without esophagitis: Secondary | ICD-10-CM | POA: Diagnosis present

## 2016-12-28 DIAGNOSIS — E039 Hypothyroidism, unspecified: Secondary | ICD-10-CM | POA: Diagnosis present

## 2016-12-28 DIAGNOSIS — Z951 Presence of aortocoronary bypass graft: Secondary | ICD-10-CM

## 2016-12-28 DIAGNOSIS — I130 Hypertensive heart and chronic kidney disease with heart failure and stage 1 through stage 4 chronic kidney disease, or unspecified chronic kidney disease: Secondary | ICD-10-CM | POA: Diagnosis present

## 2016-12-28 DIAGNOSIS — G9341 Metabolic encephalopathy: Secondary | ICD-10-CM | POA: Diagnosis not present

## 2016-12-28 DIAGNOSIS — I2510 Atherosclerotic heart disease of native coronary artery without angina pectoris: Secondary | ICD-10-CM | POA: Diagnosis present

## 2016-12-28 DIAGNOSIS — R479 Unspecified speech disturbances: Secondary | ICD-10-CM | POA: Diagnosis present

## 2016-12-28 DIAGNOSIS — M109 Gout, unspecified: Secondary | ICD-10-CM | POA: Diagnosis present

## 2016-12-28 DIAGNOSIS — H9110 Presbycusis, unspecified ear: Secondary | ICD-10-CM | POA: Diagnosis present

## 2016-12-28 DIAGNOSIS — Z823 Family history of stroke: Secondary | ICD-10-CM

## 2016-12-28 DIAGNOSIS — I252 Old myocardial infarction: Secondary | ICD-10-CM

## 2016-12-28 DIAGNOSIS — R4189 Other symptoms and signs involving cognitive functions and awareness: Secondary | ICD-10-CM | POA: Diagnosis present

## 2016-12-28 DIAGNOSIS — N183 Chronic kidney disease, stage 3 (moderate): Secondary | ICD-10-CM | POA: Diagnosis present

## 2016-12-28 LAB — URINALYSIS, ROUTINE W REFLEX MICROSCOPIC
BACTERIA UA: NONE SEEN
BILIRUBIN URINE: NEGATIVE
GLUCOSE, UA: NEGATIVE mg/dL
Ketones, ur: NEGATIVE mg/dL
LEUKOCYTES UA: NEGATIVE
NITRITE: NEGATIVE
PROTEIN: NEGATIVE mg/dL
SPECIFIC GRAVITY, URINE: 1.017 (ref 1.005–1.030)
SQUAMOUS EPITHELIAL / LPF: NONE SEEN
pH: 5 (ref 5.0–8.0)

## 2016-12-28 LAB — RAPID URINE DRUG SCREEN, HOSP PERFORMED
Amphetamines: NOT DETECTED
Barbiturates: NOT DETECTED
Benzodiazepines: NOT DETECTED
COCAINE: NOT DETECTED
OPIATES: NOT DETECTED
TETRAHYDROCANNABINOL: NOT DETECTED

## 2016-12-28 LAB — BLOOD GAS, VENOUS
Acid-Base Excess: 1 mmol/L (ref 0.0–2.0)
BICARBONATE: 24.9 mmol/L (ref 20.0–28.0)
O2 SAT: 83.6 %
PCO2 VEN: 39.4 mmHg — AB (ref 44.0–60.0)
Patient temperature: 98.6
pH, Ven: 7.417 (ref 7.250–7.430)
pO2, Ven: 46.1 mmHg — ABNORMAL HIGH (ref 32.0–45.0)

## 2016-12-28 LAB — CSF CELL COUNT WITH DIFFERENTIAL
RBC Count, CSF: 19 /mm3 — ABNORMAL HIGH
RBC Count, CSF: 280 /mm3 — ABNORMAL HIGH
Tube #: 1
Tube #: 4
WBC CSF: 1 /mm3 (ref 0–5)
WBC, CSF: 4 /mm3 (ref 0–5)

## 2016-12-28 LAB — DIFFERENTIAL
BASOS ABS: 0 10*3/uL (ref 0.0–0.1)
BASOS PCT: 0 %
EOS PCT: 0 %
Eosinophils Absolute: 0 10*3/uL (ref 0.0–0.7)
LYMPHS ABS: 1.7 10*3/uL (ref 0.7–4.0)
Lymphocytes Relative: 6 %
MONO ABS: 1.9 10*3/uL — AB (ref 0.1–1.0)
MONOS PCT: 7 %
NEUTROS ABS: 23.9 10*3/uL — AB (ref 1.7–7.7)
Neutrophils Relative %: 87 %

## 2016-12-28 LAB — COMPREHENSIVE METABOLIC PANEL
ALK PHOS: 61 U/L (ref 38–126)
ALT: 13 U/L — ABNORMAL LOW (ref 17–63)
ANION GAP: 6 (ref 5–15)
AST: 17 U/L (ref 15–41)
Albumin: 3.5 g/dL (ref 3.5–5.0)
BILIRUBIN TOTAL: 0.8 mg/dL (ref 0.3–1.2)
BUN: 25 mg/dL — ABNORMAL HIGH (ref 6–20)
CALCIUM: 9.1 mg/dL (ref 8.9–10.3)
CO2: 25 mmol/L (ref 22–32)
Chloride: 110 mmol/L (ref 101–111)
Creatinine, Ser: 1.6 mg/dL — ABNORMAL HIGH (ref 0.61–1.24)
GFR, EST AFRICAN AMERICAN: 53 mL/min — AB (ref >60)
GFR, EST NON AFRICAN AMERICAN: 46 mL/min — AB (ref >60)
Glucose, Bld: 96 mg/dL (ref 65–99)
POTASSIUM: 4.7 mmol/L (ref 3.5–5.1)
Sodium: 141 mmol/L (ref 135–145)
TOTAL PROTEIN: 6.9 g/dL (ref 6.5–8.1)

## 2016-12-28 LAB — BLOOD GAS, ARTERIAL
ACID-BASE DEFICIT: 0.6 mmol/L (ref 0.0–2.0)
Bicarbonate: 23.4 mmol/L (ref 20.0–28.0)
DRAWN BY: 11249
O2 CONTENT: 2 L/min
O2 Saturation: 94.7 %
PATIENT TEMPERATURE: 98.3
PCO2 ART: 38 mmHg (ref 32.0–48.0)
pH, Arterial: 7.405 (ref 7.350–7.450)
pO2, Arterial: 76.9 mmHg — ABNORMAL LOW (ref 83.0–108.0)

## 2016-12-28 LAB — GLUCOSE, CSF: Glucose, CSF: 55 mg/dL (ref 40–70)

## 2016-12-28 LAB — I-STAT CHEM 8, ED
BUN: 26 mg/dL — ABNORMAL HIGH (ref 6–20)
Calcium, Ion: 1.14 mmol/L — ABNORMAL LOW (ref 1.15–1.40)
Chloride: 109 mmol/L (ref 101–111)
Creatinine, Ser: 1.4 mg/dL — ABNORMAL HIGH (ref 0.61–1.24)
GLUCOSE: 96 mg/dL (ref 65–99)
HEMATOCRIT: 44 % (ref 39.0–52.0)
HEMOGLOBIN: 15 g/dL (ref 13.0–17.0)
POTASSIUM: 4.6 mmol/L (ref 3.5–5.1)
Sodium: 142 mmol/L (ref 135–145)
TCO2: 24 mmol/L (ref 0–100)

## 2016-12-28 LAB — I-STAT TROPONIN, ED: TROPONIN I, POC: 0.04 ng/mL (ref 0.00–0.08)

## 2016-12-28 LAB — ETHANOL

## 2016-12-28 LAB — CBC
HCT: 40.5 % (ref 39.0–52.0)
Hemoglobin: 13.9 g/dL (ref 13.0–17.0)
MCH: 32.6 pg (ref 26.0–34.0)
MCHC: 34.3 g/dL (ref 30.0–36.0)
MCV: 95.1 fL (ref 78.0–100.0)
Platelets: 373 10*3/uL (ref 150–400)
RBC: 4.26 MIL/uL (ref 4.22–5.81)
RDW: 15.2 % (ref 11.5–15.5)
WBC: 27.5 10*3/uL — ABNORMAL HIGH (ref 4.0–10.5)

## 2016-12-28 LAB — PROTIME-INR
INR: 1.1
Prothrombin Time: 14.2 seconds (ref 11.4–15.2)

## 2016-12-28 LAB — PROTEIN, CSF: Total  Protein, CSF: 57 mg/dL — ABNORMAL HIGH (ref 15–45)

## 2016-12-28 LAB — TSH: TSH: 2.028 u[IU]/mL (ref 0.350–4.500)

## 2016-12-28 LAB — APTT: APTT: 35 s (ref 24–36)

## 2016-12-28 LAB — AMMONIA: Ammonia: 16 umol/L (ref 9–35)

## 2016-12-28 LAB — BRAIN NATRIURETIC PEPTIDE: B Natriuretic Peptide: 528.9 pg/mL — ABNORMAL HIGH (ref 0.0–100.0)

## 2016-12-28 MED ORDER — HYDRALAZINE HCL 20 MG/ML IJ SOLN: 10.0000 mg | INTRAMUSCULAR | Status: DC | PRN

## 2016-12-28 MED ORDER — ONDANSETRON HCL 4 MG PO TABS: 4.0000 mg | ORAL_TABLET | Freq: Four times a day (QID) | ORAL | Status: DC | PRN

## 2016-12-28 MED ORDER — ACETAMINOPHEN 650 MG RE SUPP: 650.0000 mg | Freq: Four times a day (QID) | RECTAL | Status: DC | PRN

## 2016-12-28 MED ORDER — VANCOMYCIN HCL IN DEXTROSE 1-5 GM/200ML-% IV SOLN
1000.0000 mg | INTRAVENOUS | Status: AC
  Administered 2016-12-28: 1000 mg via INTRAVENOUS
  Filled 2016-12-28: qty 200

## 2016-12-28 MED ORDER — LEVOTHYROXINE SODIUM 100 MCG IV SOLR
62.5000 ug | Freq: Every day | INTRAVENOUS | Status: DC
  Administered 2016-12-29: 62.5 ug via INTRAVENOUS
  Filled 2016-12-28: qty 5

## 2016-12-28 MED ORDER — SODIUM CHLORIDE 0.9 % IV SOLN
Freq: Once | INTRAVENOUS | Status: AC
  Administered 2016-12-28: 17:00:00 via INTRAVENOUS

## 2016-12-28 MED ORDER — SODIUM CHLORIDE 0.9 % IV SOLN
INTRAVENOUS | Status: AC
  Administered 2016-12-29: 01:00:00 via INTRAVENOUS

## 2016-12-28 MED ORDER — ONDANSETRON HCL 4 MG/2ML IJ SOLN: 4.0000 mg | Freq: Four times a day (QID) | INTRAMUSCULAR | Status: DC | PRN

## 2016-12-28 MED ORDER — DEXTROSE 5 % IV SOLN
2.0000 g | Freq: Once | INTRAVENOUS | Status: AC
  Administered 2016-12-28: 2 g via INTRAVENOUS
  Filled 2016-12-28: qty 2

## 2016-12-28 MED ORDER — VANCOMYCIN HCL 500 MG IV SOLR
500.0000 mg | Freq: Two times a day (BID) | INTRAVENOUS | Status: DC
  Filled 2016-12-28: qty 500

## 2016-12-28 MED ORDER — DEXTROSE 5 % IV SOLN
10.0000 mg/kg | Freq: Once | INTRAVENOUS | Status: AC
  Administered 2016-12-28: 625 mg via INTRAVENOUS
  Filled 2016-12-28: qty 12.5

## 2016-12-28 MED ORDER — ACETAMINOPHEN 325 MG PO TABS
650.0000 mg | ORAL_TABLET | Freq: Four times a day (QID) | ORAL | Status: DC | PRN
  Administered 2017-01-01 (×2): 650 mg via ORAL
  Filled 2016-12-28 (×2): qty 2

## 2016-12-28 MED ORDER — METOPROLOL TARTRATE 5 MG/5ML IV SOLN
2.5000 mg | Freq: Three times a day (TID) | INTRAVENOUS | Status: DC
  Administered 2016-12-29 (×3): 2.5 mg via INTRAVENOUS
  Filled 2016-12-28 (×3): qty 5

## 2016-12-28 MED ORDER — LIDOCAINE HCL (PF) 1 % IJ SOLN
10.0000 mL | Freq: Once | INTRAMUSCULAR | Status: AC
  Administered 2016-12-28: 10 mL
  Filled 2016-12-28: qty 30

## 2016-12-28 MED ORDER — DEXTROSE 5 % IV SOLN: 20.0000 mg/kg | Freq: Once | INTRAVENOUS | Status: DC

## 2016-12-28 NOTE — H&P (Signed)
History and Physical    Adam Gillespie:[id number] DOB: Jun 22, 1956 DOA: 12/28/2016  PCP: ROSE, Katrinka Blazing, NP  Patient coming from: Home.  History obtained from patient's family. Patient is lethargic.  Chief Complaint: Lethargic and confused.  HPI: Adam Gillespie is a 60 y.o. male with history of CAD, status post CABG in 2001, ischemic cardiomyopathy with an EF of 25%, status post AICD, hypothyroidism, HL, GERD, CKD, gout was brought into the ER after patient was found to be increasingly lethargic since yesterday evening. Patient has not awakened him from his bed since he went to sleep last evening around 6 PM. Patient did not have any new changes in his medications. Does not drink alcohol or smoke cigarettes. Patient has an electric heat in his house and has no gas.   ED Course: In the ER patient was found to be afebrile but with leukocytosis. Chest x-ray UA a VBG were not showing anything acute. CT head unremarkable. Lumbar puncture was done so far is also a 1 WBC with 280 RBC. Patient was started on empiric antibiotics and admitted for further management of acute encephalopathy. On my exam patient is arousable and is able to tell his name and place. Moves all extremities.  Review of Systems: As per HPI, rest all negative.   Past Medical History:  Diagnosis Date  . 9562 lead 05/01/2014  . ACE inhibitor intolerance    Renal insufficiency/hyperkalemia  . Anoxic encephalopathy (HCC)    POSTOP FROM 2001  . Automatic implantable cardiac defibrillator -Medtronic 04/23/2011   a. 07/2014 gen change -  Medtronic EVERA single chamber ICD, serial number  [id number] H.  . Carotid stenosis, right    60-79% right, 40 - 59% left - needs dopplers in June 2014  . CHF (congestive heart failure) (HCC)   . Coronary artery disease   . Gout   . Hearing deficit    Bilateral ears  . Heart murmur   . Hyperlipidemia   . Hypothyroidism   . Ischemic cardiomyopathy    Remote MI with CABG x 5 in 2001;  Myocardial perfusion imaging EF 26% old infarct anterior wall. 04/2010  . LV dysfunction    EF is 25% per echo in July 2013  . 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 Sleep apnea 11-2014    Past Surgical History:  Procedure Laterality Date  . ADENOIDECTOMY    . BALLOON DILATION N/A 07/11/2013   Procedure: BALLOON DILATION;  Surgeon: Shirley Friar, MD;  Location: [facility] ENDOSCOPY;  Service: Endoscopy;  Laterality: N/A;  . CARDIAC CATHETERIZATION  08/05/2000   THERE IS ANTERIOR AND APICAL AKINESIS. EF IS ESTIMATED 20%, WITH INFEROBASILAR PORTIONS CONTRACTING REASONABLY WELL.  Marland Kitchen CARDIAC DEFIBRILLATOR PLACEMENT    . COLONOSCOPY WITH PROPOFOL N/A 01/09/2013   Procedure: COLONOSCOPY WITH PROPOFOL;  Surgeon: Shirley Friar, MD;  Location: [facility] ENDOSCOPY;  Service: Endoscopy;  Laterality: N/A;  . CORONARY ARTERY BYPASS GRAFT     x5. lima to the lad, saphenous vein graft to the intermediate and obtuse mariginal, and a saphenous vein graft to the acute mariginal and distal right coronary artery  . ESOPHAGEAL DILATION  2015  . ESOPHAGOGASTRODUODENOSCOPY N/A 07/11/2013   Procedure: ESOPHAGOGASTRODUODENOSCOPY (EGD);  Surgeon: Shirley Friar, MD;  Location: [facility] ENDOSCOPY;  Service: Endoscopy;  Laterality: N/A;  . IMPLANTABLE CARDIOVERTER DEFIBRILLATOR (ICD) GENERATOR CHANGE N/A 07/31/2014   Procedure: ICD GENERATOR CHANGE;  Surgeon: Duke Salvia, MD;  Location: [facility] CATH LAB;  Service: Cardiovascular;  Laterality: N/A;  . LEAD REVISION N/A 07/31/2014   Procedure: LEAD REVISION;  Surgeon: Duke Salvia, MD;  Location: [facility] CATH LAB;  Service: Cardiovascular;  Laterality: N/A;  . PERIPHERAL VASCULAR CATHETERIZATION N/A 05/21/2015   Procedure: Carotid PTA/Stent Intervention;  Surgeon: Sherren Kerns, MD;  Location: [facility] INVASIVE CV LAB;  Service: Cardiovascular;  Laterality: N/A;  . SAVORY DILATION N/A 07/11/2013   Procedure: SAVORY DILATION;  Surgeon: Shirley Friar, MD;  Location: [facility]  ENDOSCOPY;  Service: Endoscopy;  Laterality: N/A;  . TONSILLECTOMY       reports that he has never smoked. His smokeless tobacco use includes Chew. He reports that he does not drink alcohol or use drugs.  Allergies  Allergen Reactions  . Ace Inhibitors Other (See Comments)    Contributed to hyperkalemia and renal insufficiency in the past (see office note 10/03/2012).      Family History  Problem Relation Age of Onset  . Heart attack Mother   . Cancer Father   . Emphysema Father   . Cancer Brother   . Asthma Sister   . Emphysema Sister   . Heart attack Maternal Grandfather   . Stroke Maternal Grandmother     Prior to Admission medications   Medication Sig Start Date End Date Taking? Authorizing Provider  allopurinol (ZYLOPRIM) 100 MG tablet Take 1 tablet (100 mg total) by mouth daily. 04/28/16  Yes [name], NP  aspirin EC 81 MG tablet Take 81 mg by mouth daily.   Yes Historical Provider, MD  clopidogrel (PLAVIX) 75 MG tablet Take 1 tablet (75 mg total) by mouth daily. 04/28/16  Yes [name], NP  famotidine (PEPCID) 20 MG tablet Take 1 tablet (20 mg total) by mouth daily. 04/28/16  Yes [name], NP  furosemide (LASIX) 20 MG tablet Take 1 tablet by mouth 3  times weekly Tuesday,  Thursday and Saturday 04/28/16  Yes [name], NP  hydrALAZINE (APRESOLINE) 25 MG tablet Take 0.5 tablets (12.5 mg total) by mouth 2 (two) times daily. 04/28/16  Yes [name], NP  isosorbide mononitrate (IMDUR) 30 MG 24 hr tablet Take 0.5 tablets (15 mg total) by mouth daily. 04/28/16 04/28/17 Yes [name], NP  levothyroxine (SYNTHROID, LEVOTHROID) 125 MCG tablet Take 1 tablet (125 mcg total) by mouth daily. 04/28/16  Yes [name], NP  metoprolol succinate (TOPROL-XL) 25 MG 24 hr tablet Take 1 tablet (25 mg total) by mouth daily. 04/28/16  Yes [name], NP  oxybutynin (DITROPAN-XL) 10 MG 24 hr tablet Take 10 mg by mouth daily.   Yes Historical Provider, MD    simvastatin (ZOCOR) 40 MG tablet Take 1 tablet (40 mg total) by mouth at bedtime. 04/28/16  Yes [name], NP  tamsulosin (FLOMAX) 0.4 MG CAPS capsule Take 0.4 mg by mouth at bedtime.   Yes Historical Provider, MD    Physical Exam: Vitals:   12/28/16 2030 12/28/16 2101 12/28/16 2200 12/28/16 2230  BP: 102/74 118/87 98/67 99/57   Pulse: 90 85 82 83  Resp: (!) 33 18 16 15   Temp:  98.5 F (36.9 C)    TempSrc:  Oral    SpO2: 93% 94% 98% 97%  Weight:      Height:          Constitutional: Moderately built and nourished. Vitals:   12/28/16 2030 12/28/16 2101 12/28/16 2200 12/28/16 2230  BP: 102/74 118/87 98/67 99/57   Pulse: 90 85 82 83  Resp: (!) 33 18 16 15   Temp:  98.5 F (36.9 C)    TempSrc:  Oral    SpO2: 93% 94% 98% 97%  Weight:      Height:       Eyes: Anicteric. No pallor. ENMT: No discharge from the ears eyes nose and mouth. Neck: No mass felt. No neck rigidity. Respiratory: No rhonchi or crepitations. Cardiovascular: S1 and S2 heard no murmurs appreciated. Abdomen: Soft nontender bowel sounds present. Musculoskeletal: No edema. No joint effusion. Skin: No rash. Skin appears warm. Neurologic: Lethargic oriented to his name and place. Moves all extremities. Psychiatric: Appears lethargic and confused.   Labs on Admission: I have personally reviewed following labs and imaging studies  CBC:  Recent Labs Lab 12/28/16 1617 12/28/16 1632  WBC 27.5*  --   NEUTROABS 23.9*  --   HGB 13.9 15.0  HCT 40.5 44.0  MCV 95.1  --   PLT 373  --    Basic Metabolic Panel:  Recent Labs Lab 12/28/16 1617 12/28/16 1632  NA 141 142  K 4.7 4.6  CL 110 109  CO2 25  --   GLUCOSE 96 96  BUN 25* 26*  CREATININE 1.60* 1.40*  CALCIUM 9.1  --    GFR: Estimated Creatinine Clearance (by C-G formula based on SCr of 1.4 mg/dL (H)) Male: 29.0 mL/min Male: 50.3 mL/min Liver Function Tests:  Recent Labs Lab 12/28/16 1617  AST 17  ALT 13*  ALKPHOS 61  BILITOT  0.8  PROT 6.9  ALBUMIN 3.5   No results for input(s): LIPASE, AMYLASE in the last 168 hours.  Recent Labs Lab 12/28/16 1617  AMMONIA 16   Coagulation Profile:  Recent Labs Lab 12/28/16 1617  INR 1.10   Cardiac Enzymes: No results for input(s): CKTOTAL, CKMB, CKMBINDEX, TROPONINI in the last 168 hours. BNP (last 3 results) No results for input(s): PROBNP in the last 8760 hours. HbA1C: No results for input(s): HGBA1C in the last 72 hours. CBG: No results for input(s): GLUCAP in the last 168 hours. Lipid Profile: No results for input(s): CHOL, HDL, LDLCALC, TRIG, CHOLHDL, LDLDIRECT in the last 72 hours. Thyroid Function Tests:  Recent Labs  12/28/16 1617  TSH 2.028   Anemia Panel: No results for input(s): VITAMINB12, FOLATE, FERRITIN, TIBC, IRON, RETICCTPCT in the last 72 hours. Urine analysis:    Component Value Date/Time   COLORURINE YELLOW 12/28/2016 1544   APPEARANCEUR CLEAR 12/28/2016 1544   LABSPEC 1.017 12/28/2016 1544   PHURINE 5.0 12/28/2016 1544   GLUCOSEU NEGATIVE 12/28/2016 1544   HGBUR SMALL (A) 12/28/2016 1544   BILIRUBINUR NEGATIVE 12/28/2016 1544   KETONESUR NEGATIVE 12/28/2016 1544   PROTEINUR NEGATIVE 12/28/2016 1544   UROBILINOGEN 0.2 02/14/2013 0956   NITRITE NEGATIVE 12/28/2016 1544   LEUKOCYTESUR NEGATIVE 12/28/2016 1544   Sepsis Labs: @LABRCNTIP (procalcitonin:4,lacticidven:4) ) Recent Results (from the past 240 hour(s))  CSF culture     Status: None (Preliminary result)   Collection Time: 12/28/16  8:33 PM  Result Value Ref Range Status   Specimen Description CSF  Final   Special Requests NONE  Final   Gram Stain   Final    WBC PRESENT, PREDOMINANTLY MONONUCLEAR NO ORGANISMS SEEN CYTOSPIN SMEAR Gram Stain Report Called to,Read Back By and Verified With: J.OXENDINE AT 2149 12/28/16 BY N.THOMPSON    Culture PENDING  Incomplete   Report Status PENDING  Incomplete     Radiological Exams on Admission: Dg Chest 2 View  Result  Date: 12/28/2016 CLINICAL  DATA:  Fatigue EXAM: CHEST  2 VIEW COMPARISON:  08/01/2014 FINDINGS: Cardiac shadow is stable but mildly enlarged. Postsurgical changes as well as a defibrillator are again seen and stable. Lungs are well aerated bilaterally. No pneumothorax or sizable effusion is seen. IMPRESSION: No active cardiopulmonary disease. Electronically Signed   By: Alcide Clever M.D.   On: 12/28/2016 16:56   Ct Head Wo Contrast  Result Date: 12/28/2016 CLINICAL DATA:  Altered mental status, fatigue, disorientation EXAM: CT HEAD WITHOUT CONTRAST TECHNIQUE: Contiguous axial images were obtained from the base of the skull through the vertex without intravenous contrast. COMPARISON:  None. FINDINGS: Brain: No evidence of acute infarction, hemorrhage, hydrocephalus, extra-axial collection or mass lesion/mass effect. Vascular: Mild intracranial atherosclerosis. Skull: Normal. Negative for fracture or focal lesion. Sinuses/Orbits: Partial opacification of the bilateral maxillary sinuses. Mastoid air cells are clear. Other: None. IMPRESSION: No evidence of acute intracranial abnormality. Electronically Signed   By: Charline Bills M.D.   On: 12/28/2016 16:39    EKG: Independently reviewed. Sinus rhythm with LBBB.  Assessment/Plan Principal Problem:   Acute encephalopathy Active Problems:   Ischemic cardiomyopathy   Hypothyroidism   Implantable cardioverter-defibrillator lead failure   Complex sleep apnea syndrome    1. Acute encephalopathy - cause not clear. ABG does not show any carbon dioxide retention. Patient is afebrile. Blood cultures have been sent. Until final results of the blood cultures and lumbar puncture results are available we will continue with empiric antibiotics for now. Gentle hydration. Check TSH levels. May repeat CT head 24 hours from the first to rule out any stroke symptoms MRI brain cannot be done due to AICD. CT angiogram of the head not done due to renal failure. Speech  therapist evaluation has been requested and until then patient will be nothing by mouth. 2. Hypothyroidism on Synthroid which I will doses IV. 3. Hypertension - since patient is nothing by mouth I have placed patient on IV metoprolol as scheduled and when necessary IV hydralazine. 4. History of ischemic cardiomyopathy - holding of Lasix and gently hydrating for now. 5. CAD - has not had any chest pain.   DVT prophylaxis: SCDs. Code Status: Full code.  Family Communication: Patient's family at the bedside.  Disposition Plan: To be determined.  Consults called: None.  Admission status: Observation.    Eduard Clos MD [facility] Pager [phone].  If 7PM-7AM, please contact night-coverage [email or web address] Password [facility]  12/28/2016, 11:36 PM

## 2016-12-28 NOTE — ED Notes (Signed)
Dr. Clydene Pugh is at bedside, explaining lumbar puncture procedure with patient and family member.

## 2016-12-28 NOTE — ED Notes (Signed)
Attempted in and out cath. Due to met resistance by RN and NT attempt, unable to insert catheter.

## 2016-12-28 NOTE — ED Notes (Signed)
Placed patient on a bed alarm.

## 2016-12-28 NOTE — ED Notes (Signed)
Patient transported to CT 

## 2016-12-28 NOTE — ED Triage Notes (Signed)
Per EMS, patient's family states patient has altered mental status. Patient is alert and oriented at his normal baseline. Patient has slurred speech at baseline. Patient complaining of fatigue but has no other complaints. Denies pain.

## 2016-12-28 NOTE — ED Notes (Signed)
Condom cath applied in order to obtain urine specimen

## 2016-12-28 NOTE — ED Notes (Signed)
RT reports they have already obtained ABG.

## 2016-12-28 NOTE — ED Notes (Signed)
2 sets of blood cultures collected prior to antibiotic administration. 

## 2016-12-28 NOTE — Progress Notes (Signed)
Pharmacy Antibiotic Note  Adam Gillespie is a 61 y.o. male admitted on 12/28/2016 with sepsis.  Pharmacy has been consulted for vancomycin dosing. No notes but lumbar puncture performed so suspect rule out meningitis  Today, 12/28/2016  Renal: SCr elevated (est CrCl = 62ml/min)  WBC elevated  No fever in ED  Plan:  Vancomycin 1gm x1 then 500mg  IV q12h for goal VT 15-20 mcg/ml  Follow renal function  Check trough as indicated  Follow cultures  Height: 5\' 7"  (170.2 cm) Weight: 138 lb (62.6 kg) IBW/kg (Calculated) : 66.1  Temp (24hrs), Avg:98.6 F (37 C), Min:98.6 F (37 C), Max:98.6 F (37 C)   Recent Labs Lab 12/28/16 1617 12/28/16 1632  WBC 27.5*  --   CREATININE 1.60* 1.40*    Estimated Creatinine Clearance (by C-G formula based on SCr of 1.4 mg/dL (H)) Male: 77.3 mL/min Male: 50.3 mL/min    Allergies  Allergen Reactions  . Ace Inhibitors Other (See Comments)    Contributed to hyperkalemia and renal insufficiency in the past (see office note 10/03/2012).      Antimicrobials this admission: 3/13 vancomycin >> 3/13 ceftriaxone x 1  Dose adjustments this admission:  Microbiology results: 3/13 BCx 3/13 UCx 3/13 CSF cx: 3/13 HSV PCR: 3/13 HSC cx and typing 3/13 RPR:  3/13 HIV   Thank you for allowing pharmacy to be a part of this patient's care.  Juliette Alcide, PharmD, BCPS.   Pager: [phone] 12/28/2016 5:40 PM

## 2016-12-28 NOTE — ED Notes (Addendum)
Unable to collect cultures at this time because the MD is in the room working with patient

## 2016-12-28 NOTE — ED Notes (Signed)
Patient transported to X-ray 

## 2016-12-28 NOTE — ED Notes (Signed)
Patient attempted urine sample. Unable to void at this time.

## 2016-12-28 NOTE — ED Notes (Signed)
IV team at bedside 

## 2016-12-28 NOTE — ED Notes (Signed)
Per niece, POA, patient has appeared more drowsy than normal. Patient normally walks at home. When she went to check on patient, patient was sleep sleeping at noon. Patient is alert and oriented now.

## 2016-12-28 NOTE — ED Notes (Signed)
Moved patient from stretcher to a hospital bed for better comfort.

## 2016-12-28 NOTE — ED Provider Notes (Signed)
[facility]-EMERGENCY DEPT Provider Note   CSN: [id number] Arrival date & time: 12/28/16  1344     History   Chief Complaint Chief Complaint  Patient presents with  . Fatigue    HPI Adam Gillespie is a 61 y.o. male.  The history is provided by a relative.  Altered Mental Status   This is a new problem. The current episode started 12 to 24 hours ago. The problem has not changed since onset.Associated symptoms include confusion, somnolence and weakness. Risk factors: strong family history of death from ischemic stroke identified late in course of disease. His past medical history is significant for hypertension. Past medical history comments: prior carotid stenting, prior MI and CHF.    Past Medical History:  Diagnosis Date  . 1518 lead 05/01/2014  . ACE inhibitor intolerance    Renal insufficiency/hyperkalemia  . Anoxic encephalopathy (HCC)    POSTOP FROM 2001  . Automatic implantable cardiac defibrillator -Medtronic 04/23/2011   a. 07/2014 gen change -  Medtronic EVERA single chamber ICD, serial number  [id number].  . Carotid stenosis, right    60-79% right, 40 - 59% left - needs dopplers in June 2014  . CHF (congestive heart failure) (HCC)   . Coronary artery disease   . Gout   . Hearing deficit    Bilateral ears  . Heart murmur   . Hyperlipidemia   . Hypothyroidism   . Ischemic cardiomyopathy    Remote MI with CABG x 5 in 2001; Myocardial perfusion imaging EF 26% old infarct anterior wall. 04/2010  . LV dysfunction    EF is 25% per echo in July 2013  . 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 Sleep apnea 11-2014    Patient Active Problem List   Diagnosis Date Noted  . Carotid stenosis, asymptomatic 05/21/2015  . 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 Implantable cardioverter-defibrillator lead failure 08/01/2014  . Automatic implantable cardioverter-defibrillator in situ 08/01/2014  . Complete heart block-intermittent 08/01/2014  . IVCD (intraventricular  conduction defect) 08/01/2014  . ACE inhibitor intolerance   . 7847 Lead 05/01/2014  . Dysphagia 07/11/2013  . CAP (community acquired pneumonia) 02/14/2013  . Chest pain 02/14/2013  . Special screening for malignant neoplasms, colon 01/09/2013  . CAD (coronary artery disease) 10/01/2011  . Chronic systolic congestive heart failure (HCC) 04/23/2011  . Automatic implantable cardiac defibrillator -Medtronic 04/23/2011  . Ischemic cardiomyopathy 03/30/2011  . Carotid artery bruit 03/30/2011  . Hyperlipemia 03/30/2011  . Hypothyroidism 03/30/2011  . Gout 03/30/2011    Past Surgical History:  Procedure Laterality Date  . ADENOIDECTOMY    . BALLOON DILATION N/A 07/11/2013   Procedure: BALLOON DILATION;  Surgeon: Shirley Friar, MD;  Location: [facility] ENDOSCOPY;  Service: Endoscopy;  Laterality: N/A;  . CARDIAC CATHETERIZATION  08/05/2000   THERE IS ANTERIOR AND APICAL AKINESIS. EF IS ESTIMATED 20%, WITH INFEROBASILAR PORTIONS CONTRACTING REASONABLY WELL.  Marland Kitchen CARDIAC DEFIBRILLATOR PLACEMENT    . COLONOSCOPY WITH PROPOFOL N/A 01/09/2013   Procedure: COLONOSCOPY WITH PROPOFOL;  Surgeon: Shirley Friar, MD;  Location: [facility] ENDOSCOPY;  Service: Endoscopy;  Laterality: N/A;  . CORONARY ARTERY BYPASS GRAFT     x5. lima to the lad, saphenous vein graft to the intermediate and obtuse mariginal, and a saphenous vein graft to the acute mariginal and distal right coronary artery  . ESOPHAGEAL DILATION  2015  . ESOPHAGOGASTRODUODENOSCOPY N/A 07/11/2013   Procedure: ESOPHAGOGASTRODUODENOSCOPY (EGD);  Surgeon: Shirley Friar, MD;  Location: [facility] ENDOSCOPY;  Service:  Endoscopy;  Laterality: N/A;  . IMPLANTABLE CARDIOVERTER DEFIBRILLATOR (ICD) GENERATOR CHANGE N/A 07/31/2014   Procedure: ICD GENERATOR CHANGE;  Surgeon: Duke Salvia, MD;  Location: [facility] CATH LAB;  Service: Cardiovascular;  Laterality: N/A;  . LEAD REVISION N/A 07/31/2014   Procedure: LEAD REVISION;  Surgeon: Duke Salvia, MD;   Location: [facility] CATH LAB;  Service: Cardiovascular;  Laterality: N/A;  . PERIPHERAL VASCULAR CATHETERIZATION N/A 05/21/2015   Procedure: Carotid PTA/Stent Intervention;  Surgeon: Sherren Kerns, MD;  Location: [facility] INVASIVE CV LAB;  Service: Cardiovascular;  Laterality: N/A;  . SAVORY DILATION N/A 07/11/2013   Procedure: SAVORY DILATION;  Surgeon: Shirley Friar, MD;  Location: [facility] ENDOSCOPY;  Service: Endoscopy;  Laterality: N/A;  . TONSILLECTOMY         Home Medications    Prior to Admission medications   Medication Sig Start Date End Date Taking? Authorizing Provider  allopurinol (ZYLOPRIM) 100 MG tablet Take 1 tablet (100 mg total) by mouth daily. 04/28/16  Yes [name], NP  aspirin EC 81 MG tablet Take 81 mg by mouth daily.   Yes Historical Provider, MD  clopidogrel (PLAVIX) 75 MG tablet Take 1 tablet (75 mg total) by mouth daily. 04/28/16  Yes [name], NP  famotidine (PEPCID) 20 MG tablet Take 1 tablet (20 mg total) by mouth daily. 04/28/16  Yes [name], NP  furosemide (LASIX) 20 MG tablet Take 1 tablet by mouth 3  times weekly Tuesday,  Thursday and Saturday 04/28/16  Yes [name], NP  hydrALAZINE (APRESOLINE) 25 MG tablet Take 0.5 tablets (12.5 mg total) by mouth 2 (two) times daily. 04/28/16  Yes [name], NP  isosorbide mononitrate (IMDUR) 30 MG 24 hr tablet Take 0.5 tablets (15 mg total) by mouth daily. 04/28/16 04/28/17 Yes [name], NP  levothyroxine (SYNTHROID, LEVOTHROID) 125 MCG tablet Take 1 tablet (125 mcg total) by mouth daily. 04/28/16  Yes [name], NP  metoprolol succinate (TOPROL-XL) 25 MG 24 hr tablet Take 1 tablet (25 mg total) by mouth daily. 04/28/16  Yes [name], NP  oxybutynin (DITROPAN-XL) 10 MG 24 hr tablet Take 10 mg by mouth daily.   Yes Historical Provider, MD  simvastatin (ZOCOR) 40 MG tablet Take 1 tablet (40 mg total) by mouth at bedtime. 04/28/16  Yes [name], NP  tamsulosin (FLOMAX) 0.4 MG CAPS  capsule Take 0.4 mg by mouth at bedtime.   Yes Historical Provider, MD    Family History Family History  Problem Relation Age of Onset  . Heart attack Mother   . Cancer Father   . Emphysema Father   . Cancer Brother   . Asthma Sister   . Emphysema Sister   . Heart attack Maternal Grandfather   . Stroke Maternal Grandmother     Social History Social History  Substance Use Topics  . Smoking status: Never Smoker  . Smokeless tobacco: Current User    Types: Chew     Comment: snuff  . Alcohol use No     Allergies   Ace inhibitors   Review of Systems Review of Systems  Neurological: Positive for weakness.  Psychiatric/Behavioral: Positive for confusion.  All other systems reviewed and are negative.    Physical Exam Updated Vital Signs BP 103/59   Pulse 85   Temp 97.8 F (36.6 C) (Oral)   Resp 19   Ht 5\' 7"  (1.702 m)   Wt 138 lb (62.6 kg)  SpO2 96%   BMI 21.61 kg/m   Physical Exam  Constitutional: He appears well-developed and well-nourished. He appears lethargic. No distress.  HENT:  Head: Normocephalic and atraumatic.  Nose: Nose normal.  Eyes: Conjunctivae are normal.  Neck: Neck supple. No tracheal deviation present.  Cardiovascular: Normal rate and regular rhythm.   Pulmonary/Chest: Effort normal. No respiratory distress.  Abdominal: Soft. Normal appearance. He exhibits no distension. There is no tenderness. There is no rigidity, no rebound and no guarding.  Neurological: He has normal strength. He appears lethargic. No cranial nerve deficit or sensory deficit. GCS eye subscore is 3. GCS verbal subscore is 4. GCS motor subscore is 6.  Sleeping intermittently  Skin: Skin is warm and dry.  Psychiatric: His speech is slurred. He is slowed and withdrawn.     ED Treatments / Results  Labs (all labs ordered are listed, but only abnormal results are displayed) Labs Reviewed  CBC - Abnormal; Notable for the following:       Result Value   WBC 27.5 (*)     All other components within normal limits  DIFFERENTIAL - Abnormal; Notable for the following:    Neutro Abs 23.9 (*)    Monocytes Absolute 1.9 (*)    All other components within normal limits  COMPREHENSIVE METABOLIC PANEL - Abnormal; Notable for the following:    BUN 25 (*)    Creatinine, Ser 1.60 (*)    ALT 13 (*)    GFR calc non Af Amer 46 (*)    GFR calc Af Amer 53 (*)    All other components within normal limits  URINALYSIS, ROUTINE W REFLEX MICROSCOPIC - Abnormal; Notable for the following:    Hgb urine dipstick SMALL (*)    All other components within normal limits  BLOOD GAS, VENOUS - Abnormal; Notable for the following:    pCO2, Ven 39.4 (*)    pO2, Ven 46.1 (*)    All other components within normal limits  BRAIN NATRIURETIC PEPTIDE - Abnormal; Notable for the following:    B Natriuretic Peptide 528.9 (*)    All other components within normal limits  CSF CELL COUNT WITH DIFFERENTIAL - Abnormal; Notable for the following:    RBC Count, CSF 280 (*)    All other components within normal limits  CSF CELL COUNT WITH DIFFERENTIAL - Abnormal; Notable for the following:    RBC Count, CSF 19 (*)    All other components within normal limits  PROTEIN, CSF - Abnormal; Notable for the following:    Total  Protein, CSF 57 (*)    All other components within normal limits  BLOOD GAS, ARTERIAL - Abnormal; Notable for the following:    pO2, Arterial 76.9 (*)    All other components within normal limits  GLUCOSE, CAPILLARY - Abnormal; Notable for the following:    Glucose-Capillary 116 (*)    All other components within normal limits  I-STAT CHEM 8, ED - Abnormal; Notable for the following:    BUN 26 (*)    Creatinine, Ser 1.40 (*)    Calcium, Ion 1.14 (*)    All other components within normal limits  CSF CULTURE  CULTURE, BLOOD (ROUTINE X 2)  CULTURE, BLOOD (ROUTINE X 2)  URINE CULTURE  HSV CULTURE AND TYPING  ETHANOL  PROTIME-INR  APTT  RAPID URINE DRUG SCREEN, HOSP  PERFORMED  TSH  AMMONIA  GLUCOSE, CSF  VDRL, CSF  HERPES SIMPLEX VIRUS(HSV) DNA BY PCR  RPR  HIV  ANTIBODY (ROUTINE TESTING)  BASIC METABOLIC PANEL  CBC  HEPATIC FUNCTION PANEL  I-STAT TROPOININ, ED    EKG  EKG Interpretation  Date/Time:  Tuesday December 28 2016 16:07:05 EDT Ventricular Rate:  88 PR Interval:    QRS Duration: 160 QT Interval:  384 QTC Calculation: 465 R Axis:   -79 Text Interpretation:  Sinus rhythm Probable left atrial enlargement Left bundle branch block Artifact in lead(s) I III aVR aVL Since last tracing rate faster Otherwise no significant change Confirmed by Adeana Grilliot MD, Elya Diloreto [phone]) on 12/28/2016 4:12:23 PM       Radiology Dg Chest 2 View  Result Date: 12/28/2016 CLINICAL DATA:  Fatigue EXAM: CHEST  2 VIEW COMPARISON:  08/01/2014 FINDINGS: Cardiac shadow is stable but mildly enlarged. Postsurgical changes as well as a defibrillator are again seen and stable. Lungs are well aerated bilaterally. No pneumothorax or sizable effusion is seen. IMPRESSION: No active cardiopulmonary disease. Electronically Signed   By: Alcide Clever M.D.   On: 12/28/2016 16:56   Ct Head Wo Contrast  Result Date: 12/28/2016 CLINICAL DATA:  Altered mental status, fatigue, disorientation EXAM: CT HEAD WITHOUT CONTRAST TECHNIQUE: Contiguous axial images were obtained from the base of the skull through the vertex without intravenous contrast. COMPARISON:  None. FINDINGS: Brain: No evidence of acute infarction, hemorrhage, hydrocephalus, extra-axial collection or mass lesion/mass effect. Vascular: Mild intracranial atherosclerosis. Skull: Normal. Negative for fracture or focal lesion. Sinuses/Orbits: Partial opacification of the bilateral maxillary sinuses. Mastoid air cells are clear. Other: None. IMPRESSION: No evidence of acute intracranial abnormality. Electronically Signed   By: Charline Bills M.D.   On: 12/28/2016 16:39    Procedures .Lumbar Puncture Date/Time: 12/29/2016 1:53  AM Performed by: Lyndal Pulley Authorized by: Lyndal Pulley   Consent:    Consent obtained:  Written   Consent given by:  Healthcare agent   Risks discussed:  Bleeding, infection, headache, nerve damage, pain and repeat procedure   Alternatives discussed:  Delayed treatment and observation Pre-procedure details:    Procedure purpose:  Diagnostic   Preparation: Patient was prepped and draped in usual sterile fashion   Anesthesia (see MAR for exact dosages):    Anesthesia method:  Local infiltration   Local anesthetic:  Lidocaine 1% w/o epi Procedure details:    Lumbar space:  L4-L5 interspace   Patient position:  R lateral decubitus   Needle gauge:  22   Needle type:  Spinal needle - Quincke tip   Needle length (in):  3.5   Ultrasound guidance: no     Number of attempts:  2   Fluid appearance:  Blood-tinged then clearing   Tubes of fluid:  4   Total volume (ml):  5 Post-procedure:    Puncture site:  Adhesive bandage applied   Patient tolerance of procedure:  Tolerated well, no immediate complications   (including critical care time)  Medications Ordered in ED Medications  levothyroxine (SYNTHROID, LEVOTHROID) injection 62.5 mcg (not administered)  metoprolol (LOPRESSOR) injection 2.5 mg (2.5 mg Intravenous Given 12/29/16 0103)  hydrALAZINE (APRESOLINE) injection 10 mg (not administered)  acetaminophen (TYLENOL) tablet 650 mg (not administered)    Or  acetaminophen (TYLENOL) suppository 650 mg (not administered)  ondansetron (ZOFRAN) tablet 4 mg (not administered)    Or  ondansetron (ZOFRAN) injection 4 mg (not administered)  0.9 %  sodium chloride infusion ( Intravenous New Bag/Given 12/29/16 0044)  cefTRIAXone (ROCEPHIN) 2 g in dextrose 5 % 50 mL IVPB (not administered)  acyclovir (ZOVIRAX) 625 mg in  dextrose 5 % 100 mL IVPB (not administered)  0.9 %  sodium chloride infusion ( Intravenous Restarted 12/28/16 2144)  cefTRIAXone (ROCEPHIN) 2 g in dextrose 5 % 50 mL IVPB (0 g  Intravenous Stopped 12/28/16 1935)  vancomycin (VANCOCIN) IVPB 1000 mg/200 mL premix (0 mg Intravenous Stopped 12/28/16 2323)  acyclovir (ZOVIRAX) 625 mg in dextrose 5 % 100 mL IVPB (625 mg Intravenous New Bag/Given 12/28/16 2324)  lidocaine (PF) (XYLOCAINE) 1 % injection 10 mL (10 mLs Infiltration Given by Other 12/28/16 1942)     Initial Impression / Assessment and Plan / ED Course  I have reviewed the triage vital signs and the nursing notes.  Pertinent labs & imaging results that were available during my care of the patient were reviewed by me and considered in my medical decision making (see chart for details).     61 y.o. male presents with acute encephalopathy and sleepiness from home. Last seen normal yesterday and found to be minimally interactive and far from baseline per family who went to see him. He is slowed and minimally communicative here, falls asleep multiple times during examination and appears unwell. Broad workup initiated. No indication of acute cardiac, hematologic, endocrine, metabolic, or neurologic causes noted.   Pt with marked leukocytosis of unclear etiology. No fever but with encephalopathy he warrants CNS infection r/o with LP. Covered for meningitis and encephalitis empirically pending culture with vanc/rocephin/acyclovir. Unable to get definitive stroke r/o with MR because of ICD, high risk with strong family history and family states he has known bilateral carotid artery stenosis. No fcal symptoms suggesting stroke but may need further workup.   Hospitalist was consulted for admission and will see the patient in the emergency department.   Final Clinical Impressions(s) / ED Diagnoses   Final diagnoses:  Encephalopathy acute  Leukocytosis, unspecified type    New Prescriptions New Prescriptions   No medications on file     Lyndal Pulley, MD 12/29/16 0205

## 2016-12-29 DIAGNOSIS — M109 Gout, unspecified: Secondary | ICD-10-CM | POA: Diagnosis present

## 2016-12-29 DIAGNOSIS — I255 Ischemic cardiomyopathy: Secondary | ICD-10-CM | POA: Diagnosis present

## 2016-12-29 DIAGNOSIS — Z951 Presence of aortocoronary bypass graft: Secondary | ICD-10-CM | POA: Diagnosis not present

## 2016-12-29 DIAGNOSIS — I130 Hypertensive heart and chronic kidney disease with heart failure and stage 1 through stage 4 chronic kidney disease, or unspecified chronic kidney disease: Secondary | ICD-10-CM | POA: Diagnosis present

## 2016-12-29 DIAGNOSIS — I252 Old myocardial infarction: Secondary | ICD-10-CM | POA: Diagnosis not present

## 2016-12-29 DIAGNOSIS — Z95810 Presence of automatic (implantable) cardiac defibrillator: Secondary | ICD-10-CM | POA: Diagnosis not present

## 2016-12-29 DIAGNOSIS — H9110 Presbycusis, unspecified ear: Secondary | ICD-10-CM | POA: Diagnosis present

## 2016-12-29 DIAGNOSIS — G9340 Encephalopathy, unspecified: Secondary | ICD-10-CM | POA: Diagnosis present

## 2016-12-29 DIAGNOSIS — D72829 Elevated white blood cell count, unspecified: Secondary | ICD-10-CM | POA: Diagnosis present

## 2016-12-29 DIAGNOSIS — I2510 Atherosclerotic heart disease of native coronary artery without angina pectoris: Secondary | ICD-10-CM | POA: Diagnosis present

## 2016-12-29 DIAGNOSIS — E785 Hyperlipidemia, unspecified: Secondary | ICD-10-CM | POA: Diagnosis present

## 2016-12-29 DIAGNOSIS — R1310 Dysphagia, unspecified: Secondary | ICD-10-CM | POA: Diagnosis present

## 2016-12-29 DIAGNOSIS — K219 Gastro-esophageal reflux disease without esophagitis: Secondary | ICD-10-CM | POA: Diagnosis present

## 2016-12-29 DIAGNOSIS — E039 Hypothyroidism, unspecified: Secondary | ICD-10-CM | POA: Diagnosis present

## 2016-12-29 DIAGNOSIS — Z823 Family history of stroke: Secondary | ICD-10-CM | POA: Diagnosis not present

## 2016-12-29 DIAGNOSIS — R479 Unspecified speech disturbances: Secondary | ICD-10-CM | POA: Diagnosis present

## 2016-12-29 DIAGNOSIS — N183 Chronic kidney disease, stage 3 (moderate): Secondary | ICD-10-CM | POA: Diagnosis present

## 2016-12-29 DIAGNOSIS — Z8249 Family history of ischemic heart disease and other diseases of the circulatory system: Secondary | ICD-10-CM | POA: Diagnosis not present

## 2016-12-29 DIAGNOSIS — R4189 Other symptoms and signs involving cognitive functions and awareness: Secondary | ICD-10-CM | POA: Diagnosis present

## 2016-12-29 DIAGNOSIS — Z825 Family history of asthma and other chronic lower respiratory diseases: Secondary | ICD-10-CM | POA: Diagnosis not present

## 2016-12-29 DIAGNOSIS — I509 Heart failure, unspecified: Secondary | ICD-10-CM | POA: Diagnosis present

## 2016-12-29 DIAGNOSIS — G9341 Metabolic encephalopathy: Secondary | ICD-10-CM | POA: Diagnosis present

## 2016-12-29 DIAGNOSIS — G4730 Sleep apnea, unspecified: Secondary | ICD-10-CM | POA: Diagnosis present

## 2016-12-29 LAB — HEPATIC FUNCTION PANEL
ALT: 10 U/L — AB (ref 17–63)
AST: 12 U/L — ABNORMAL LOW (ref 15–41)
Albumin: 2.7 g/dL — ABNORMAL LOW (ref 3.5–5.0)
Alkaline Phosphatase: 50 U/L (ref 38–126)
BILIRUBIN INDIRECT: 0.6 mg/dL (ref 0.3–0.9)
Bilirubin, Direct: 0.1 mg/dL (ref 0.1–0.5)
TOTAL PROTEIN: 5.4 g/dL — AB (ref 6.5–8.1)
Total Bilirubin: 0.7 mg/dL (ref 0.3–1.2)

## 2016-12-29 LAB — BASIC METABOLIC PANEL
Anion gap: 7 (ref 5–15)
BUN: 25 mg/dL — AB (ref 6–20)
CALCIUM: 7.9 mg/dL — AB (ref 8.9–10.3)
CO2: 21 mmol/L — ABNORMAL LOW (ref 22–32)
CREATININE: 1.26 mg/dL — AB (ref 0.61–1.24)
Chloride: 113 mmol/L — ABNORMAL HIGH (ref 101–111)
GFR calc Af Amer: >60 mL/min (ref >60)
Glucose, Bld: 89 mg/dL (ref 65–99)
Potassium: 4.2 mmol/L (ref 3.5–5.1)
Sodium: 141 mmol/L (ref 135–145)

## 2016-12-29 LAB — CBC
HCT: 34.4 % — ABNORMAL LOW (ref 39.0–52.0)
HEMOGLOBIN: 11.5 g/dL — AB (ref 13.0–17.0)
MCH: 32.1 pg (ref 26.0–34.0)
MCHC: 33.4 g/dL (ref 30.0–36.0)
MCV: 96.1 fL (ref 78.0–100.0)
PLATELETS: 319 10*3/uL (ref 150–400)
RBC: 3.58 MIL/uL — AB (ref 4.22–5.81)
RDW: 15.5 % (ref 11.5–15.5)
WBC: 19.2 10*3/uL — AB (ref 4.0–10.5)

## 2016-12-29 LAB — GLUCOSE, CAPILLARY
GLUCOSE-CAPILLARY: 146 mg/dL — AB (ref 65–99)
GLUCOSE-CAPILLARY: 91 mg/dL (ref 65–99)
GLUCOSE-CAPILLARY: 92 mg/dL (ref 65–99)
GLUCOSE-CAPILLARY: 95 mg/dL (ref 65–99)
Glucose-Capillary: 113 mg/dL — ABNORMAL HIGH (ref 65–99)
Glucose-Capillary: 116 mg/dL — ABNORMAL HIGH (ref 65–99)
Glucose-Capillary: 90 mg/dL (ref 65–99)

## 2016-12-29 LAB — TSH: TSH: 1.744 u[IU]/mL (ref 0.350–4.500)

## 2016-12-29 LAB — HIV ANTIBODY (ROUTINE TESTING W REFLEX): HIV Screen 4th Generation wRfx: NONREACTIVE

## 2016-12-29 LAB — RPR: RPR Ser Ql: NONREACTIVE

## 2016-12-29 LAB — TROPONIN I
TROPONIN I: 0.03 ng/mL — AB (ref <0.03)
Troponin I: 0.03 ng/mL (ref <0.03)
Troponin I: 0.03 ng/mL (ref <0.03)
Troponin I: 0.03 ng/mL (ref <0.03)

## 2016-12-29 MED ORDER — METOPROLOL SUCCINATE ER 25 MG PO TB24
25.0000 mg | ORAL_TABLET | Freq: Every day | ORAL | Status: DC
  Administered 2016-12-29 – 2017-01-02 (×5): 25 mg via ORAL
  Filled 2016-12-29 (×5): qty 1

## 2016-12-29 MED ORDER — CLOPIDOGREL BISULFATE 75 MG PO TABS
75.0000 mg | ORAL_TABLET | Freq: Every day | ORAL | Status: DC
  Administered 2016-12-29 – 2017-01-02 (×5): 75 mg via ORAL
  Filled 2016-12-29 (×5): qty 1

## 2016-12-29 MED ORDER — DEXTROSE 5 % IV SOLN
2.0000 g | Freq: Two times a day (BID) | INTRAVENOUS | Status: DC
  Administered 2016-12-29: 2 g via INTRAVENOUS
  Filled 2016-12-29: qty 2

## 2016-12-29 MED ORDER — ASPIRIN EC 81 MG PO TBEC
81.0000 mg | DELAYED_RELEASE_TABLET | Freq: Every day | ORAL | Status: DC
  Administered 2016-12-29 – 2017-01-02 (×5): 81 mg via ORAL
  Filled 2016-12-29 (×5): qty 1

## 2016-12-29 MED ORDER — VANCOMYCIN HCL 500 MG IV SOLR
500.0000 mg | Freq: Two times a day (BID) | INTRAVENOUS | Status: DC
  Administered 2016-12-29: 500 mg via INTRAVENOUS
  Filled 2016-12-29: qty 500

## 2016-12-29 MED ORDER — ISOSORBIDE MONONITRATE ER 30 MG PO TB24
15.0000 mg | ORAL_TABLET | Freq: Every day | ORAL | Status: DC
  Administered 2016-12-29 – 2017-01-02 (×5): 15 mg via ORAL
  Filled 2016-12-29 (×5): qty 1

## 2016-12-29 MED ORDER — OXYBUTYNIN CHLORIDE ER 5 MG PO TB24
10.0000 mg | ORAL_TABLET | Freq: Every day | ORAL | Status: DC
Start: 2016-12-29 — End: 2017-01-02
  Administered 2016-12-29 – 2017-01-02 (×5): 10 mg via ORAL
  Filled 2016-12-29 (×5): qty 2

## 2016-12-29 MED ORDER — LEVOTHYROXINE SODIUM 25 MCG PO TABS
125.0000 ug | ORAL_TABLET | Freq: Every day | ORAL | Status: DC
  Administered 2016-12-30 – 2017-01-02 (×4): 125 ug via ORAL
  Filled 2016-12-29 (×4): qty 1

## 2016-12-29 MED ORDER — TAMSULOSIN HCL 0.4 MG PO CAPS
0.4000 mg | ORAL_CAPSULE | Freq: Every day | ORAL | Status: DC
  Administered 2016-12-29 – 2017-01-01 (×4): 0.4 mg via ORAL
  Filled 2016-12-29 (×4): qty 1

## 2016-12-29 MED ORDER — DEXTROSE 5 % IV SOLN
625.0000 mg | Freq: Three times a day (TID) | INTRAVENOUS | Status: DC
  Administered 2016-12-29 – 2017-01-01 (×10): 625 mg via INTRAVENOUS
  Filled 2016-12-29 (×11): qty 12.5

## 2016-12-29 MED ORDER — FAMOTIDINE 20 MG PO TABS
20.0000 mg | ORAL_TABLET | Freq: Every day | ORAL | Status: DC
  Administered 2016-12-29 – 2017-01-02 (×5): 20 mg via ORAL
  Filled 2016-12-29 (×5): qty 1

## 2016-12-29 MED ORDER — ALLOPURINOL 100 MG PO TABS
100.0000 mg | ORAL_TABLET | Freq: Every day | ORAL | Status: DC
  Administered 2016-12-29 – 2017-01-02 (×5): 100 mg via ORAL
  Filled 2016-12-29 (×5): qty 1

## 2016-12-29 MED ORDER — DEXTROSE 5 % IV SOLN
2.0000 g | INTRAVENOUS | Status: DC
  Administered 2016-12-30 – 2017-01-01 (×3): 2 g via INTRAVENOUS
  Filled 2016-12-29 (×3): qty 2

## 2016-12-29 NOTE — Evaluation (Signed)
Physical Therapy Evaluation Patient Details Name: Adam Gillespie MRN: [id number] DOB: Aug 06, 1956 Today's Date: 12/29/2016   History of Present Illness  Adam Gillespie is a 61 y.o. male with history of CAD, status post CABG in 2001, ischemic cardiomyopathy with an EF of 25%, status post AICD, hypothyroidism, HL, GERD, CKD, gout was brought into the ER after patient was found to be increasingly lethargic since yesterday evening.   Clinical Impression  Pt admitted with above diagnosis. Pt currently with functional limitations due to the deficits listed below (see PT Problem List).  Pt will benefit from skilled PT to increase their independence and safety with mobility to allow discharge to the venue listed below.   Pt cooperative with PT, follows one step commands without difficulty; gait unsteady initially but no overt LOB, stability improved with distance;  Spoke with family and they feel he is nearing baseline mobility, encouraged pt to use RW at home, he reluctantly agrees; no f/u recommended post acute, will follow in acute setting     Follow Up Recommendations No PT follow up (family feels pt returning to baseline)    Equipment Recommendations  None recommended by PT    Recommendations for Other Services       Precautions / Restrictions Precautions Precautions: Fall Restrictions Weight Bearing Restrictions: No      Mobility  Bed Mobility Overal bed mobility: Needs Assistance Bed Mobility: Supine to Sit;Sit to Supine     Supine to sit: Supervision Sit to supine: Min guard   General bed mobility comments: incr time to complete task; pt able to scoot to [facility] in supine with cues for technique  Transfers Overall transfer level: Needs assistance Equipment used: Rolling walker (2 wheeled) Transfers: Sit to/from Stand Sit to Stand: Min guard         General transfer comment: light assist to rise and stabilize; cues for safety  Ambulation/Gait Ambulation/Gait assistance:  Min assist;Min guard Ambulation Distance (Feet): 60 Feet Assistive device: Rolling walker (2 wheeled) Gait Pattern/deviations: Step-to pattern;Step-through pattern;Shuffle;Trunk flexed;Decreased stride length     General Gait Details: mildly festinating gait initially, anterior wt shift which improved with distance; assist to maneuver RW and cues to stay inside of RW during turns  Stairs            Wheelchair Mobility    Modified Rankin (Stroke Patients Only)       Balance Overall balance assessment: Needs assistance;History of Falls   Sitting balance-Leahy Scale: Good Sitting balance - Comments: pt able to reach to feet and adjust socks without LOB     Standing balance-Leahy Scale: Fair                               Pertinent Vitals/Pain Pain Assessment: No/denies pain    Home Living Family/patient expects to be discharged to:: Private residence Living Arrangements: Alone Available Help at Discharge: Available PRN/intermittently Type of Home: Mobile home Home Access: Ramped entrance     Home Layout: One level Home Equipment: Walker - 2 wheels;Bedside commode;Wheelchair - manual;Hand held shower head;Shower seat      Prior Function Level of Independence: Independent with assistive device(s);Independent         Comments: pt reports he uses a cane sometimes; family cooks and leaves frozen meals for a week at a time for him; pt is IND bathing, dressing mobility     Hand Dominance   Dominant Hand: Left  Extremity/Trunk Assessment   Upper Extremity Assessment Upper Extremity Assessment: Defer to OT evaluation;Overall WFL for tasks assessed    Lower Extremity Assessment Lower Extremity Assessment: RLE deficits/detail;LLE deficits/detail RLE Deficits / Details: limited df--neutral ankle in supine, difficulty with foot flat in standing LLE Deficits / Details: same as above       Communication   Communication: HOH  Cognition  Arousal/Alertness: Awake/alert Behavior During Therapy: WFL for tasks assessed/performed                   General Comments: family reports pt is getting back to baseline mentation    General Comments      Exercises     Assessment/Plan    PT Assessment Patient needs continued PT services  PT Problem List Decreased activity tolerance;Decreased balance;Decreased mobility;Decreased knowledge of use of DME;Decreased cognition;Decreased safety awareness       PT Treatment Interventions DME instruction;Gait training;Functional mobility training;Therapeutic activities;Therapeutic exercise    PT Goals (Current goals can be found in the Care Plan section)  Acute Rehab PT Goals Patient Stated Goal: home soon PT Goal Formulation: With patient Time For Goal Achievement: 01/05/17 Potential to Achieve Goals: Good    Frequency Min 3X/week   Barriers to discharge        Co-evaluation               End of Session Equipment Utilized During Treatment: Gait belt Activity Tolerance: Patient tolerated treatment well Patient left: in chair;with call bell/phone within reach Nurse Communication: Other (comment) (pt ok to walk with family assist ) PT Visit Diagnosis: Other abnormalities of gait and mobility (R26.89)    Functional Assessment Tool Used: Clinical judgement;AM-PAC 6 Clicks Basic Mobility Functional Limitation: Mobility: Walking and moving around Mobility: Walking and Moving Around Current Status (A2130): At least 1 percent but less than 20 percent impaired, limited or restricted Mobility: Walking and Moving Around Goal Status [phone]): At least 1 percent but less than 20 percent impaired, limited or restricted    Time: 1435-1459 PT Time Calculation (min) (ACUTE ONLY): 24 min   Charges:   PT Evaluation $PT Eval Low Complexity: 1 Procedure PT Treatments $Gait Training: 8-22 mins   PT G Codes:   PT G-Codes **NOT FOR INPATIENT CLASS** Functional Assessment Tool  Used: Clinical judgement;AM-PAC 6 Clicks Basic Mobility Functional Limitation: Mobility: Walking and moving around Mobility: Walking and Moving Around Current Status (I6962): At least 1 percent but less than 20 percent impaired, limited or restricted Mobility: Walking and Moving Around Goal Status [phone]): At least 1 percent but less than 20 percent impaired, limited or restricted     [facility] 12/29/2016, 3:23 PM

## 2016-12-29 NOTE — Care Management Note (Signed)
Case Management Note  Patient Details  Name: Adam Gillespie MRN: [id number] Date of Birth: December 21, 1955  Subjective/Objective: 61 y.o. M admitted from home where he lives alone. Sister and Niece check on him frequently and assist him with groceries and other  Needs. Has all DME per Adam Gillespie his niece who is visiting today and has never used [facility] agencies. She also reports that he is at his baseline. Await PT evaluation.                    Action/Plan:Will continue to follow.    Expected Discharge Date:   (unknown)               Expected Discharge Plan:     In-House Referral:     Discharge planning Services  CM Consult  Post Acute Care Choice:    Choice offered to:   (Niece Adam Gillespie)  DME Arranged:    DME Agency:     HH Arranged:    HH Agency:     Status of Service:  In process, will continue to follow  If discussed at Long Length of Stay Meetings, dates discussed:    Additional Comments:  Yvone Neu, RN 12/29/2016, 2:04 PM

## 2016-12-29 NOTE — Progress Notes (Addendum)
CRITICAL VALUE ALERT  Critical value received:  Troponin 0.03  Date of notification:  12/29/2016  Time of notification:  0659  Critical value read back:Yes.    Nurse who received alert:  Francesca Oman RN  MD notified (1st page):  Landis Gandy  Time of first page:  0701  MD notified (2nd page):  Time of second page:  Responding MD:    Time MD responded:

## 2016-12-29 NOTE — ED Notes (Signed)
Will give bedside report to [facility]

## 2016-12-29 NOTE — ED Notes (Signed)
Notified Elnita Maxwell, patients niece of the room number for patient.

## 2016-12-29 NOTE — Progress Notes (Signed)
Patient felt sensation to urinate approximately 15 minutes after clamping foley. Foley was unclamped.

## 2016-12-29 NOTE — Progress Notes (Signed)
Pharmacy Antibiotic Note  Adam Gillespie is a 61 y.o. male admitted on 12/28/2016 with sepsis, encephalitis .  Pharmacy has been consulted for ceftriaxone, acyclovir dosing.  Plan: Ceftriaxone 2gm iv q12hr  Acyclovir 625mg  iv q8hr  Height: 5\' 4"  (162.6 cm) Weight: 136 lb 6.4 oz (61.9 kg) IBW/kg (Calculated) : 59.2  Temp (24hrs), Avg:98.3 F (36.8 C), Min:97.8 F (36.6 C), Max:98.6 F (37 C)   Recent Labs Lab 12/28/16 1617 12/28/16 1632  WBC 27.5*  --   CREATININE 1.60* 1.40*    Estimated Creatinine Clearance (by C-G formula based on SCr of 1.4 mg/dL (H)) Male: 19.7 mL/min Male: 47.6 mL/min    Allergies  Allergen Reactions  . Ace Inhibitors Other (See Comments)    Contributed to hyperkalemia and renal insufficiency in the past (see office note 10/03/2012).      Antimicrobials this admission: 3/13 vancomycin >> 3/13 ceftriaxone >> 3/13 acyclovir >>  Dose adjustments this admission: -  Microbiology results: 3/13 BCx 3/13 UCx 3/13 CSF cx: 3/13 HSV PCR: 3/13 HSC cx and typing 3/13 RPR:  3/13 HIV   Thank you for allowing pharmacy to be a part of this patient's care.  Aleene Davidson Crowford 12/29/2016 2:57 AM

## 2016-12-29 NOTE — Evaluation (Signed)
Clinical/Bedside Swallow Evaluation Patient Details  Name: Adam Gillespie MRN: [id number] Date of Birth: [birth year]  Today's Date: 12/29/2016 Time: SLP Start Time (ACUTE ONLY): 1218 SLP Stop Time (ACUTE ONLY): 1235 SLP Time Calculation (min) (ACUTE ONLY): 17 min  Past Medical History:  Past Medical History:  Diagnosis Date  . 4098 lead 05/01/2014  . ACE inhibitor intolerance    Renal insufficiency/hyperkalemia  . Anoxic encephalopathy (HCC)    POSTOP FROM 2001  . Automatic implantable cardiac defibrillator -Medtronic 04/23/2011   a. 07/2014 gen change -  Medtronic EVERA single chamber ICD, serial number  [id number] H.  . Carotid stenosis, right    60-79% right, 40 - 59% left - needs dopplers in June 2014  . CHF (congestive heart failure) (HCC)   . Coronary artery disease   . Gout   . Hearing deficit    Bilateral ears  . Heart murmur   . Hyperlipidemia   . Hypothyroidism   . Ischemic cardiomyopathy    Remote MI with CABG x 5 in 2001; Myocardial perfusion imaging EF 26% old infarct anterior wall. 04/2010  . LV dysfunction    EF is 25% per echo in July 2013  . 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 Sleep apnea 11-2014   Past Surgical History:  Past Surgical History:  Procedure Laterality Date  . ADENOIDECTOMY    . BALLOON DILATION N/A 07/11/2013   Procedure: BALLOON DILATION;  Surgeon: Shirley Friar, MD;  Location: [facility] ENDOSCOPY;  Service: Endoscopy;  Laterality: N/A;  . CARDIAC CATHETERIZATION  08/05/2000   THERE IS ANTERIOR AND APICAL AKINESIS. EF IS ESTIMATED 20%, WITH INFEROBASILAR PORTIONS CONTRACTING REASONABLY WELL.  Marland Kitchen CARDIAC DEFIBRILLATOR PLACEMENT    . COLONOSCOPY WITH PROPOFOL N/A 01/09/2013   Procedure: COLONOSCOPY WITH PROPOFOL;  Surgeon: Shirley Friar, MD;  Location: [facility] ENDOSCOPY;  Service: Endoscopy;  Laterality: N/A;  . CORONARY ARTERY BYPASS GRAFT     x5. lima to the lad, saphenous vein graft to the intermediate and obtuse mariginal, and a  saphenous vein graft to the acute mariginal and distal right coronary artery  . ESOPHAGEAL DILATION  2015  . ESOPHAGOGASTRODUODENOSCOPY N/A 07/11/2013   Procedure: ESOPHAGOGASTRODUODENOSCOPY (EGD);  Surgeon: Shirley Friar, MD;  Location: [facility] ENDOSCOPY;  Service: Endoscopy;  Laterality: N/A;  . IMPLANTABLE CARDIOVERTER DEFIBRILLATOR (ICD) GENERATOR CHANGE N/A 07/31/2014   Procedure: ICD GENERATOR CHANGE;  Surgeon: Duke Salvia, MD;  Location: [facility] CATH LAB;  Service: Cardiovascular;  Laterality: N/A;  . LEAD REVISION N/A 07/31/2014   Procedure: LEAD REVISION;  Surgeon: Duke Salvia, MD;  Location: [facility] CATH LAB;  Service: Cardiovascular;  Laterality: N/A;  . PERIPHERAL VASCULAR CATHETERIZATION N/A 05/21/2015   Procedure: Carotid PTA/Stent Intervention;  Surgeon: Sherren Kerns, MD;  Location: [facility] INVASIVE CV LAB;  Service: Cardiovascular;  Laterality: N/A;  . SAVORY DILATION N/A 07/11/2013   Procedure: SAVORY DILATION;  Surgeon: Shirley Friar, MD;  Location: [facility] ENDOSCOPY;  Service: Endoscopy;  Laterality: N/A;  . TONSILLECTOMY     HPI:  Adam Gillespie is a 61 y.o. male with history of CAD, status post CABG in 2001, ischemic cardiomyopathy with an EF of 25%, status post AICD, hypothyroidism, HL, GERD, CKD, gout was brought into the ER after patient was found to be increasingly lethargic since yesterday evening.  Swallow evaluation ordered.  Pt has had prior endoscopy/balloon dilatation with improvement in swallow.  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  No indication of airway compromise with po.  Slow mastication noted with trace oral residuals-  Lingual discoordination impacts oral abilities.  Pt does become slightly dyspenic with intake - which family states is normal.  3 ounce water test given to pt with good tolerance.  Educated  pt/family to general aspiration precautions given pt premorbid GERD, dysarthria and respiratory difficulties.  Agree with dys3/thin diet - no SLP follow up indicated.  SLP Visit Diagnosis: Dysphagia, oral phase (R13.11)    Aspiration Risk  Mild aspiration risk    Diet Recommendation Dysphagia 3 (Mech soft);Thin liquid   Liquid Administration via: Cup;Straw Medication Administration: Other (Comment) (as tolerated, with pudding/puree if problematic) Supervision: Patient able to self feed Compensations: Small sips/bites;Slow rate;Minimize environmental distractions Postural Changes: Seated upright at 90 degrees;Remain upright for at least 30 minutes after po intake    Other  Recommendations Oral Care Recommendations: Oral care BID   Follow up Recommendations   n/a     Frequency and Duration   none         Prognosis   none     Swallow Study   General Date of Onset: 12/29/16 HPI: Adam Gillespie is a 61 y.o. male with history of CAD, status post CABG in 2001, ischemic cardiomyopathy with an EF of 25%, status post AICD, hypothyroidism, HL, GERD, CKD, gout was brought into the ER after patient was found to be increasingly lethargic since yesterday evening.  Swallow evaluation ordered.  Pt has had prior endoscopy/balloon dilatation with improvement in swallow.  Family reports pt takes a reflux medication per GI Type of Study: Bedside Swallow Evaluation Previous Swallow Assessment: endoscopy 2015 Diet Prior to this Study: Dysphagia 3 (soft);Thin liquids Temperature Spikes Noted: No Respiratory Status: Room air History of Recent Intubation: No Behavior/Cognition: Alert;Cooperative;Other (Comment) (very HOH) Oral Cavity Assessment: Within Functional Limits Oral Care Completed by SLP: No Oral Cavity - Dentition: Poor condition;Missing dentition Vision: Functional for self-feeding Self-Feeding Abilities: Able to feed self Patient Positioning: Upright in chair Baseline Vocal Quality:  Normal (strong) Volitional Cough: Strong Volitional Swallow: Able to elicit    Oral/Motor/Sensory Function Overall Oral Motor/Sensory Function:  (pt is severely dysarthric which family states has been present since birth and has not worsened)   Ice Chips Ice chips: Not tested   Thin Liquid Thin Liquid: Within functional limits Presentation: Cup    Nectar Thick Nectar Thick Liquid: Not tested   Honey Thick Honey Thick Liquid: Not tested   Puree Puree: Within functional limits Presentation: Self Fed;Spoon   Solid   GO   Solid: Impaired Oral Phase Impairments: Impaired mastication Oral Phase Functional Implications: Oral residue;Impaired mastication Other Comments: slow mastication with trace oral residuals; clears        Chales Abrahams 12/29/2016,5:48 PM  Donavan Burnet, MS [facility] SLP [phone]

## 2016-12-29 NOTE — Progress Notes (Signed)
  BSE completed. Involved family members present.  Agree with dys3/thin.   Donavan Burnet, MS [facility] SLP    [phone]

## 2016-12-29 NOTE — Progress Notes (Signed)
PROGRESS NOTE    Adam Gillespie  ZOX:[id number] DOB: 01-11-1956 DOA: 12/28/2016 PCP: [name], NP  Outpatient Specialists:     Brief Narrative:  61 y/o ? R Carotid stent 05/21/15 Foll Dr Louis Meckel plavix and asaa CAD s/p CABG x 5 2001 AICD for EF 25%-initally 2006-reoplaced battery 10/29015-Low risk Nuc Stress 07/2014 OISA supposed to be on CPAP Hypothyroid Gout  Admitted to [facility] with lethargy and confusuiion Labs showed leukocytosis CT head neg LP done nothing  Started on broad spectrum acyclovir, Ceftriaxone, Vancomycin  Assessment & Plan:   Principal Problem:   Acute encephalopathy Active Problems:   Ischemic cardiomyopathy   Hypothyroidism   Implantable cardioverter-defibrillator lead failure   Complex sleep apnea syndrome   I acute metabolic encephalopathy-unclear etiology. Treating as infection. Follow urine culture, follow CSF cultures. narrow antibiotics -acyclovir, did d/c vancomycin, ceftriaxone-->REgular 1 gm dosing--DO not think this is meningitis.WBC 27--->19.  Feel mentation has improved enough not to warrant rpt Ct head II history of recently for the cardiomyopathy EF 25 04/2012-follow blood culture from admit patient follow Toprol-XL 20 5 daily, hydralazine 0.5 twice a day, Imdur 15 mg daily III ischemic cardiomyopathy post CABG X5 2001-when able continue Plavix 75, aspirin 81 for now on hold--he had gray zone troponin 0.03 on admission and we will cycle troponins IV hypothyroidism continue Synthroid 125 g daily when able. Check TSH V sleep apnea-supposed to be on CPAP. On admission ABG did not show any carbon dioxide retention VI #dysphagia and speech therapy to see and give her conditions. VII presbycusis with severe speech impediment. Monitor   Full code in patient pending resolution Tele D/w niece on phone   Consultants:     Procedures:     Antimicrobials:       Subjective:  Alert pleasant oriented but slow speech  In  NAD Nurse reports no issues but slow speech Niece didn;t thuink he was at [facility]   Objective: Vitals:   12/28/16 2230 12/29/16 0043 12/29/16 0045 12/29/16 0517  BP: 99/57  120/64 (!) 107/46  Pulse: 83  95 75  Resp: 15  20 18   Temp:   97.8 F (36.6 C) 98.8 F (37.1 C)  TempSrc:   Oral Oral  SpO2: 97%  100% 96%  Weight:  61.9 kg (136 lb 6.4 oz)    Height:  5\' 4"  (1.626 m)      Intake/Output Summary (Last 24 hours) at 12/29/16 0758 Last data filed at 12/29/16 0600  Gross per 24 hour  Intake           639.17 ml  Output              425 ml  Net           214.17 ml   Filed Weights   12/28/16 1407 12/29/16 0043  Weight: 62.6 kg (138 lb) 61.9 kg (136 lb 6.4 oz)    Examination:  General exam: Appears calm and comfortable very HOH Respiratory system: Clear to auscultation. Respiratory effort normal. Cardiovascular system: S1 & S2 heard, RRR. No JVD Gastrointestinal system: Abdomen is nondistended, soft and nontender Central nervous system: Alert and oriented. No focal neurological deficits. Extremities: Symmetric 5 x 5 power. Skin: No rashes, lesions or ulcers Psychiatry: Judgement and insight appear normal. Mood & affect appropriate.     Data Reviewed: I have personally reviewed following labs and imaging studies  CBC:  Recent Labs Lab 12/28/16 1617 12/28/16 1632 12/29/16 0453  WBC 27.5*  --  19.2*  NEUTROABS 23.9*  --   --   HGB 13.9 15.0 11.5*  HCT 40.5 44.0 34.4*  MCV 95.1  --  96.1  PLT 373  --  319   Basic Metabolic Panel:  Recent Labs Lab 12/28/16 1617 12/28/16 1632 12/29/16 0453  NA 141 142 141  K 4.7 4.6 4.2  CL 110 109 113*  CO2 25  --  21*  GLUCOSE 96 96 89  BUN 25* 26* 25*  CREATININE 1.60* 1.40* 1.26*  CALCIUM 9.1  --  7.9*   GFR: Estimated Creatinine Clearance (by C-G formula based on SCr of 1.26 mg/dL (H)) Male: 19.1 mL/min Male: 52.9 mL/min Liver Function Tests:  Recent Labs Lab 12/28/16 1617 12/29/16 0453  AST 17 12*   ALT 13* 10*  ALKPHOS 61 50  BILITOT 0.8 0.7  PROT 6.9 5.4*  ALBUMIN 3.5 2.7*   No results for input(s): LIPASE, AMYLASE in the last 168 hours.  Recent Labs Lab 12/28/16 1617  AMMONIA 16   Coagulation Profile:  Recent Labs Lab 12/28/16 1617  INR 1.10   Cardiac Enzymes:  Recent Labs Lab 12/29/16 0453  TROPONINI 0.03*   BNP (last 3 results) No results for input(s): PROBNP in the last 8760 hours. HbA1C: No results for input(s): HGBA1C in the last 72 hours. CBG:  Recent Labs Lab 12/29/16 0050 12/29/16 0530 12/29/16 0739  GLUCAP 116* 92 95   Lipid Profile: No results for input(s): CHOL, HDL, LDLCALC, TRIG, CHOLHDL, LDLDIRECT in the last 72 hours. Thyroid Function Tests:  Recent Labs  12/28/16 1617  TSH 2.028   Anemia Panel: No results for input(s): VITAMINB12, FOLATE, FERRITIN, TIBC, IRON, RETICCTPCT in the last 72 hours. Urine analysis:    Component Value Date/Time   COLORURINE YELLOW 12/28/2016 1544   APPEARANCEUR CLEAR 12/28/2016 1544   LABSPEC 1.017 12/28/2016 1544   PHURINE 5.0 12/28/2016 1544   GLUCOSEU NEGATIVE 12/28/2016 1544   HGBUR SMALL (A) 12/28/2016 1544   BILIRUBINUR NEGATIVE 12/28/2016 1544   KETONESUR NEGATIVE 12/28/2016 1544   PROTEINUR NEGATIVE 12/28/2016 1544   UROBILINOGEN 0.2 02/14/2013 0956   NITRITE NEGATIVE 12/28/2016 1544   LEUKOCYTESUR NEGATIVE 12/28/2016 1544   Sepsis Labs: @LABRCNTIP (procalcitonin:4,lacticidven:4)  ) Recent Results (from the past 240 hour(s))  CSF culture     Status: None (Preliminary result)   Collection Time: 12/28/16  8:33 PM  Result Value Ref Range Status   Specimen Description CSF  Final   Special Requests NONE  Final   Gram Stain   Final    WBC PRESENT, PREDOMINANTLY MONONUCLEAR NO ORGANISMS SEEN CYTOSPIN SMEAR Gram Stain Report Called to,Read Back By and Verified With: J.OXENDINE AT 2149 12/28/16 BY N.THOMPSON    Culture PENDING  Incomplete   Report Status PENDING  Incomplete          Radiology Studies: Dg Chest 2 View  Result Date: 12/28/2016 CLINICAL DATA:  Fatigue EXAM: CHEST  2 VIEW COMPARISON:  08/01/2014 FINDINGS: Cardiac shadow is stable but mildly enlarged. Postsurgical changes as well as a defibrillator are again seen and stable. Lungs are well aerated bilaterally. No pneumothorax or sizable effusion is seen. IMPRESSION: No active cardiopulmonary disease. Electronically Signed   By: Alcide Clever M.D.   On: 12/28/2016 16:56   Ct Head Wo Contrast  Result Date: 12/28/2016 CLINICAL DATA:  Altered mental status, fatigue, disorientation EXAM: CT HEAD WITHOUT CONTRAST TECHNIQUE: Contiguous axial images were obtained from the base of the skull through the vertex without intravenous contrast. COMPARISON:  None. FINDINGS: Brain: No evidence of acute infarction, hemorrhage, hydrocephalus, extra-axial collection or mass lesion/mass effect. Vascular: Mild intracranial atherosclerosis. Skull: Normal. Negative for fracture or focal lesion. Sinuses/Orbits: Partial opacification of the bilateral maxillary sinuses. Mastoid air cells are clear. Other: None. IMPRESSION: No evidence of acute intracranial abnormality. Electronically Signed   By: Charline Bills M.D.   On: 12/28/2016 16:39        Scheduled Meds: . acyclovir  625 mg Intravenous Q8H  . cefTRIAXone (ROCEPHIN)  IV  2 g Intravenous Q12H  . levothyroxine  62.5 mcg Intravenous Daily  . metoprolol  2.5 mg Intravenous Q8H  . vancomycin  500 mg Intravenous Q12H   Continuous Infusions: . sodium chloride 100 mL/hr at 12/29/16 0044     LOS: 0 days    Time spent: 68    Pleas Koch, MD [facility]   If 7PM-7AM, please contact night-coverage [email or web address] Password [facility] 12/29/2016, 7:58 AM

## 2016-12-30 LAB — GLUCOSE, CAPILLARY
GLUCOSE-CAPILLARY: 108 mg/dL — AB (ref 65–99)
GLUCOSE-CAPILLARY: 113 mg/dL — AB (ref 65–99)
Glucose-Capillary: 91 mg/dL (ref 65–99)
Glucose-Capillary: 94 mg/dL (ref 65–99)

## 2016-12-30 LAB — CBC
HCT: 32.4 % — ABNORMAL LOW (ref 39.0–52.0)
Hemoglobin: 10.8 g/dL — ABNORMAL LOW (ref 13.0–17.0)
MCH: 31.8 pg (ref 26.0–34.0)
MCHC: 33.3 g/dL (ref 30.0–36.0)
MCV: 95.3 fL (ref 78.0–100.0)
Platelets: 303 10*3/uL (ref 150–400)
RBC: 3.4 MIL/uL — ABNORMAL LOW (ref 4.22–5.81)
RDW: 15.4 % (ref 11.5–15.5)
WBC: 13.8 10*3/uL — ABNORMAL HIGH (ref 4.0–10.5)

## 2016-12-30 LAB — COMPREHENSIVE METABOLIC PANEL
ALT: 9 U/L — ABNORMAL LOW (ref 17–63)
AST: 12 U/L — ABNORMAL LOW (ref 15–41)
Albumin: 2.8 g/dL — ABNORMAL LOW (ref 3.5–5.0)
Alkaline Phosphatase: 56 U/L (ref 38–126)
Anion gap: 7 (ref 5–15)
BUN: 21 mg/dL — ABNORMAL HIGH (ref 6–20)
CO2: 22 mmol/L (ref 22–32)
Calcium: 8.2 mg/dL — ABNORMAL LOW (ref 8.9–10.3)
Chloride: 109 mmol/L (ref 101–111)
Creatinine, Ser: 1.32 mg/dL — ABNORMAL HIGH (ref 0.61–1.24)
GFR calc Af Amer: >60 mL/min (ref >60)
GFR calc non Af Amer: 57 mL/min — ABNORMAL LOW (ref >60)
Glucose, Bld: 94 mg/dL (ref 65–99)
Potassium: 4 mmol/L (ref 3.5–5.1)
Sodium: 138 mmol/L (ref 135–145)
Total Bilirubin: 0.7 mg/dL (ref 0.3–1.2)
Total Protein: 5.7 g/dL — ABNORMAL LOW (ref 6.5–8.1)

## 2016-12-30 LAB — URINE CULTURE: Culture: NO GROWTH

## 2016-12-30 LAB — VDRL, CSF: SYPHILIS VDRL QUANT CSF: NONREACTIVE

## 2016-12-30 MED ORDER — ENSURE ENLIVE PO LIQD: 237.0000 mL | Freq: Two times a day (BID) | ORAL | Status: DC | PRN

## 2016-12-30 NOTE — Progress Notes (Signed)
PROGRESS NOTE    Adam Gillespie  PHK:[id number] DOB: 1955/10/22 DOA: 12/28/2016 PCP: [name], NP  Outpatient Specialists:     Brief Narrative:  61 y/o ? R Carotid stent 05/21/15 Foll Dr Murlean Caller plavix and asaa CAD s/p CABG x 5 2001 AICD for EF 25%-initally 2006-reoplaced battery 10/29015-Low risk Nuc Stress 07/2014 OISA supposed to be on CPAP Hypothyroid Gout  Admitted to [facility] with lethargy and confusuiion Labs showed leukocytosis CT head neg LP done nothing  Started on broad spectrum acyclovir, Ceftriaxone, Vancomycin  Assessment & Plan:   Principal Problem:   Acute encephalopathy Active Problems:   Ischemic cardiomyopathy   Hypothyroidism   Implantable cardioverter-defibrillator lead failure   Complex sleep apnea syndrome   I acute metabolic encephalopathy-unclear etiology. Treating as infection. Follow BC--the urine culture NGTD, follow CSF cultures. narrow antibiotics -acyclovir, did d/c vancomycin, ceftriaxone-->Regular 1 gm dosing--DO not think this is meningitis.WBC 27--->19--->13.  Feel mentation has improved enough not to warrant rpt Ct head.  If continues to improve probably would d/c acyclovir II history of recently for the cardiomyopathy EF 25 04/2012-follow blood culture from admit patient- Toprol-XL 20 5 daily, hydralazine 0.5 twice a day, Imdur 15 mg daily III ischemic cardiomyopathy post CABG X5 2001-when able continue Plavix 75, aspirin 81 for now on hold--he had gray zone troponin 0.03 on admission and we will cycle troponins IV hypothyroidism continue Synthroid 125 g daily when able. TSH wnl V sleep apnea-supposed to be on CPAP. On admission ABG did not show any carbon dioxide retention VI #dysphagia and speech therapy concurs with dys III diet VII presbycusis with severe speech impediment. Monitor VIII CKD 2-3-stable   Full code in patient pending resolution Tele D/w niece on phone   Consultants:     Procedures:      Antimicrobials:       Subjective:  Clamped foley and can remove. No other issues CBG's reasonable overall much more alert Family at bedsdie   Objective: Vitals:   12/29/16 0940 12/29/16 1312 12/29/16 2039 12/30/16 0516  BP:  113/62 119/67 108/61  Pulse:  80 82 73  Resp:  19 18 19   Temp:  98.2 F (36.8 C) 98.5 F (36.9 C) 98.6 F (37 C)  TempSrc:  Oral Oral Oral  SpO2: 92% 97% 97% 99%  Weight:      Height:        Intake/Output Summary (Last 24 hours) at 12/30/16 0753 Last data filed at 12/30/16 0622  Gross per 24 hour  Intake            957.5 ml  Output             1200 ml  Net           -242.5 ml   Filed Weights   12/28/16 1407 12/29/16 0043  Weight: 62.6 kg (138 lb) 61.9 kg (136 lb 6.4 oz)    Examination:  General exam: Appears calm and comfortable very HOH-overall more awake and alert Respiratory system: Clear to auscultation. Respiratory effort normal. Cardiovascular system: S1 & S2 heard, RRR. No JVD Gastrointestinal system: Abdomen is nondistended, soft and nontender Central nervous system: Alert and oriented. No focal neurological deficits. Extremities: Symmetric 5 x 5 power. Skin: No rashes, lesions or ulcers Psychiatry: Judgement and insight appear normal. Mood & affect appropriate.     Data Reviewed: I have personally reviewed following labs and imaging studies  CBC:  Recent Labs Lab 12/28/16 1617 12/28/16 1632 12/29/16 0453 12/30/16 0457  WBC 27.5*  --  19.2* 13.8*  NEUTROABS 23.9*  --   --   --   HGB 13.9 15.0 11.5* 10.8*  HCT 40.5 44.0 34.4* 32.4*  MCV 95.1  --  96.1 95.3  PLT 373  --  319 303   Basic Metabolic Panel:  Recent Labs Lab 12/28/16 1617 12/28/16 1632 12/29/16 0453 12/30/16 0457  NA 141 142 141 138  K 4.7 4.6 4.2 4.0  CL 110 109 113* 109  CO2 25  --  21* 22  GLUCOSE 96 96 89 94  BUN 25* 26* 25* 21*  CREATININE 1.60* 1.40* 1.26* 1.32*  CALCIUM 9.1  --  7.9* 8.2*   GFR: Estimated Creatinine  Clearance (by C-G formula based on SCr of 1.32 mg/dL (H)) Male: 16.1 mL/min (A) Male: 50.5 mL/min (A) Liver Function Tests:  Recent Labs Lab 12/28/16 1617 12/29/16 0453 12/30/16 0457  AST 17 12* 12*  ALT 13* 10* 9*  ALKPHOS 61 50 56  BILITOT 0.8 0.7 0.7  PROT 6.9 5.4* 5.7*  ALBUMIN 3.5 2.7* 2.8*   No results for input(s): LIPASE, AMYLASE in the last 168 hours.  Recent Labs Lab 12/28/16 1617  AMMONIA 16   Coagulation Profile:  Recent Labs Lab 12/28/16 1617  INR 1.10   Cardiac Enzymes:  Recent Labs Lab 12/29/16 0453 12/29/16 0819 12/29/16 1449 12/29/16 1935  TROPONINI 0.03* 0.03* 0.03* 0.03*   BNP (last 3 results) No results for input(s): PROBNP in the last 8760 hours. HbA1C: No results for input(s): HGBA1C in the last 72 hours. CBG:  Recent Labs Lab 12/29/16 1708 12/29/16 2029 12/29/16 2351 12/30/16 0414 12/30/16 0742  GLUCAP 113* 91 146* 91 94   Lipid Profile: No results for input(s): CHOL, HDL, LDLCALC, TRIG, CHOLHDL, LDLDIRECT in the last 72 hours. Thyroid Function Tests:  Recent Labs  12/29/16 0819  TSH 1.744   Anemia Panel: No results for input(s): VITAMINB12, FOLATE, FERRITIN, TIBC, IRON, RETICCTPCT in the last 72 hours. Urine analysis:    Component Value Date/Time   COLORURINE YELLOW 12/28/2016 1544   APPEARANCEUR CLEAR 12/28/2016 1544   LABSPEC 1.017 12/28/2016 1544   PHURINE 5.0 12/28/2016 1544   GLUCOSEU NEGATIVE 12/28/2016 1544   HGBUR SMALL (A) 12/28/2016 1544   BILIRUBINUR NEGATIVE 12/28/2016 1544   KETONESUR NEGATIVE 12/28/2016 1544   PROTEINUR NEGATIVE 12/28/2016 1544   UROBILINOGEN 0.2 02/14/2013 0956   NITRITE NEGATIVE 12/28/2016 1544   LEUKOCYTESUR NEGATIVE 12/28/2016 1544   Sepsis Labs: @LABRCNTIP (procalcitonin:4,lacticidven:4)  ) Recent Results (from the past 240 hour(s))  Blood culture (routine x 2)     Status: None (Preliminary result)   Collection Time: 12/28/16  6:35 PM  Result Value Ref Range Status    Specimen Description BLOOD RIGHT HAND  Final   Special Requests BOTTLES DRAWN AEROBIC AND ANAEROBIC 5CC  Final   Culture   Final    NO GROWTH < 12 HOURS Performed at [facility], [address], [place], [place] [address]    Report Status PENDING  Incomplete  Blood culture (routine x 2)     Status: None (Preliminary result)   Collection Time: 12/28/16  6:42 PM  Result Value Ref Range Status   Specimen Description BLOOD LEFT HAND  Final   Special Requests IN PEDIATRIC BOTTLE 1CC  Final   Culture   Final    NO GROWTH < 12 HOURS Performed at [facility], [address] [place], [place] [address]    Report Status PENDING  Incomplete  CSF culture     Status: None (Preliminary result)   Collection Time: 12/28/16  8:33 PM  Result Value Ref Range Status   Specimen Description CSF  Final   Special Requests NONE  Final   Gram Stain   Final    WBC PRESENT, PREDOMINANTLY MONONUCLEAR NO ORGANISMS SEEN CYTOSPIN SMEAR Gram Stain Report Called to,Read Back By and Verified With: J.OXENDINE AT 2149 12/28/16 BY N.THOMPSON    Culture PENDING  Incomplete   Report Status PENDING  Incomplete         Radiology Studies: Dg Chest 2 View  Result Date: 12/28/2016 CLINICAL DATA:  Fatigue EXAM: CHEST  2 VIEW COMPARISON:  08/01/2014 FINDINGS: Cardiac shadow is stable but mildly enlarged. Postsurgical changes as well as a defibrillator are again seen and stable. Lungs are well aerated bilaterally. No pneumothorax or sizable effusion is seen. IMPRESSION: No active cardiopulmonary disease. Electronically Signed   By: Alcide Clever M.D.   On: 12/28/2016 16:56   Ct Head Wo Contrast  Result Date: 12/28/2016 CLINICAL DATA:  Altered mental status, fatigue, disorientation EXAM: CT HEAD WITHOUT CONTRAST TECHNIQUE: Contiguous axial images were obtained from the base of the skull through the vertex without intravenous contrast. COMPARISON:  None. FINDINGS: Brain: No evidence of acute infarction,  hemorrhage, hydrocephalus, extra-axial collection or mass lesion/mass effect. Vascular: Mild intracranial atherosclerosis. Skull: Normal. Negative for fracture or focal lesion. Sinuses/Orbits: Partial opacification of the bilateral maxillary sinuses. Mastoid air cells are clear. Other: None. IMPRESSION: No evidence of acute intracranial abnormality. Electronically Signed   By: Charline Bills M.D.   On: 12/28/2016 16:39        Scheduled Meds: . acyclovir  625 mg Intravenous Q8H  . allopurinol  100 mg Oral Daily  . aspirin EC  81 mg Oral Daily  . cefTRIAXone (ROCEPHIN)  IV  2 g Intravenous Q24H  . clopidogrel  75 mg Oral Daily  . famotidine  20 mg Oral Daily  . isosorbide mononitrate  15 mg Oral Daily  . levothyroxine  125 mcg Oral QAC breakfast  . metoprolol succinate  25 mg Oral Daily  . oxybutynin  10 mg Oral Daily  . tamsulosin  0.4 mg Oral QHS   Continuous Infusions:    LOS: 1 day    Time spent: 79    Pleas Koch, MD [facility] ([facility]   If 7PM-7AM, please contact night-coverage [email or web address] Password [facility] 12/30/2016, 7:53 AM

## 2016-12-31 ENCOUNTER — Encounter (HOSPITAL_COMMUNITY): Payer: Medicare Other

## 2016-12-31 ENCOUNTER — Ambulatory Visit: Payer: Medicare Other | Admitting: Family

## 2016-12-31 LAB — CBC WITH DIFFERENTIAL/PLATELET
BASOS ABS: 0 10*3/uL (ref 0.0–0.1)
Basophils Relative: 0 %
EOS PCT: 2 %
Eosinophils Absolute: 0.2 10*3/uL (ref 0.0–0.7)
HEMATOCRIT: 35.5 % — AB (ref 39.0–52.0)
Hemoglobin: 12 g/dL — ABNORMAL LOW (ref 13.0–17.0)
LYMPHS PCT: 12 %
Lymphs Abs: 1.4 10*3/uL (ref 0.7–4.0)
MCH: 32.2 pg (ref 26.0–34.0)
MCHC: 33.8 g/dL (ref 30.0–36.0)
MCV: 95.2 fL (ref 78.0–100.0)
MONO ABS: 0.7 10*3/uL (ref 0.1–1.0)
MONOS PCT: 6 %
NEUTROS ABS: 9.3 10*3/uL — AB (ref 1.7–7.7)
Neutrophils Relative %: 80 %
Platelets: 328 10*3/uL (ref 150–400)
RBC: 3.73 MIL/uL — ABNORMAL LOW (ref 4.22–5.81)
RDW: 15.2 % (ref 11.5–15.5)
WBC: 11.7 10*3/uL — ABNORMAL HIGH (ref 4.0–10.5)

## 2016-12-31 LAB — HSV DNA BY PCR (REFERENCE LAB): HSV 1 DNA: NEGATIVE

## 2016-12-31 LAB — HERPES SIMPLEX VIRUS(HSV) DNA BY PCR: HSV 2 DNA: NEGATIVE

## 2016-12-31 NOTE — Progress Notes (Signed)
Pharmacy Antibiotic Note  Adam Gillespie is a 61 y.o. male admitted on 12/28/2016 with sepsis, encephalitis .  Pharmacy has been consulted for ceftriaxone, acyclovir dosing.  Plan: Continue Ceftriaxone 2gm iv q24hr  Continue Acyclovir 625mg  iv q8hr Follow renal function  Height: 5\' 4"  (162.6 cm) Weight: 136 lb 6.4 oz (61.9 kg) IBW/kg (Calculated) : 59.2  Temp (24hrs), Avg:98.2 F (36.8 C), Min:98.1 F (36.7 C), Max:98.3 F (36.8 C)   Recent Labs Lab 12/28/16 1617 12/28/16 1632 12/29/16 0453 12/30/16 0457 12/31/16 0819  WBC 27.5*  --  19.2* 13.8* 11.7*  CREATININE 1.60* 1.40* 1.26* 1.32*  --     Estimated Creatinine Clearance (by C-G formula based on SCr of 1.32 mg/dL (H)) Male: 01.7 mL/min (A) Male: 50.5 mL/min (A)    Allergies  Allergen Reactions  . Ace Inhibitors Other (See Comments)    Contributed to hyperkalemia and renal insufficiency in the past (see office note 10/03/2012).      Antimicrobials this admission: 3/13 vancomycin >> 3/14 3/13 ceftriaxone >> 3/13 acyclovir >>  Dose adjustments this admission: -  Microbiology results: 3/13 BCx ngtd 3/13 UCx ngf 3/13 CSF cx: ngtd 3/13 HSV PCR: 3/13 HSC cx and typing 3/13 RPR:  3/13 HIV   Thank you for allowing pharmacy to be a part of this patient's care.  Maurice March 12/31/2016 11:35 AM

## 2016-12-31 NOTE — Progress Notes (Signed)
PROGRESS NOTE    Adam Gillespie  WUJ:[id number] DOB: [birth year] DOA: 12/28/2016 PCP: [name], NP  Outpatient Specialists:     Brief Narrative:  61 y/o ? R Carotid stent 05/21/15 Foll Dr Murlean Caller plavix and asaa CAD s/p CABG x 5 2001 AICD for EF 25%-initally 2006-replaced battery 10/29015-Low risk Nuc Stress 07/2014 OISA supposed to be on CPAP Hypothyroid Gout  Admitted to [facility] with lethargy and confusuiion Labs showed leukocytosis CT head neg LP done nothing  Started on broad spectrum acyclovir, Ceftriaxone, Vancomycin  Assessment & Plan:   Principal Problem:   Acute encephalopathy Active Problems:   Ischemic cardiomyopathy   Hypothyroidism   Implantable cardioverter-defibrillator lead failure   Complex sleep apnea syndrome   I acute metabolic encephalopathy-unclear etiology. Treating as infection. Follow BC--the urine culture NGTD, follow CSF cultures. narrow antibiotics -acyclovir, did d/c vancomycin, ceftriaxone-->Regular 1 gm dosing--DO not think this is meningitis.WBC 27--->19--->13-->11. Awaiting repeat labs and narrowing antibiotic soon  Get ammonia to rule out other confusioonal stat-suspect will be neg II history of recently for the cardiomyopathy EF 25 04/2012-follow blood culture from admit patient- Toprol-XL 20 5 daily, hydralazine 0.5 twice a day, Imdur 15 mg daily III ischemic cardiomyopathy post CABG X5 2001-when able continue Plavix 75, aspirin 81 for now on hold--patient not symptomatic with borderline troponin with chest pain IV hypothyroidism continue Synthroid 125 g daily when able. TSH wnl V sleep apnea-supposed to be on CPAP. On admission ABG did not show any carbon dioxide retention VI dysphagia and speech therapy concurs with dys III diet VII presbycusis with severe speech impediment. Monitor VIII CKD 2-3-stable IX Cognitive defcit and slurred speech-chronic.   Full code In-patient pending resolution Tele D/w niece  bedside   Consultants:     Procedures:     Antimicrobials:       Subjective:  Some confusions overnight. No new issues  incontinent of urine and stool niece concerned sleeping a lot more than ususal   Objective: Vitals:   12/30/16 0516 12/30/16 1310 12/30/16 2117 12/31/16 0526  BP: 108/61 127/66 131/72 125/64  Pulse: 73 67 68 75  Resp: 19 20 (!) 22 (!) 24  Temp: 98.6 F (37 C) 98.3 F (36.8 C) 98.3 F (36.8 C) 98.1 F (36.7 C)  TempSrc: Oral Oral Oral Oral  SpO2: 99% 99% 99% 96%  Weight:      Height:        Intake/Output Summary (Last 24 hours) at 12/31/16 0752 Last data filed at 12/31/16 0600  Gross per 24 hour  Intake           1107.5 ml  Output             1300 ml  Net           -192.5 ml   Filed Weights   12/28/16 1407 12/29/16 0043  Weight: 62.6 kg (138 lb) 61.9 kg (136 lb 6.4 oz)    Examination:  General exam: Appears calm and comfortable very HOH-seems aboutt he same Respiratory system: Clear to auscultation. Respiratory effort normal. Cardiovascular system: S1 & S2 heard, RRR. No JVD Gastrointestinal system: Abdomen is nondistended, soft and nontender Central nervous system: Alert and oriented. Poor recall but seems clearer than prior-doesn't appear confused Extremities: Symmetric 5 x 5 power. Skin: No rashes, lesions or ulcers Psychiatry: Judgement and insight appear normal. Mood & affect appropriate.     Data Reviewed: I have personally reviewed following labs and imaging studies  CBC:  Recent Labs  Lab 12/28/16 1617 12/28/16 1632 12/29/16 0453 12/30/16 0457  WBC 27.5*  --  19.2* 13.8*  NEUTROABS 23.9*  --   --   --   HGB 13.9 15.0 11.5* 10.8*  HCT 40.5 44.0 34.4* 32.4*  MCV 95.1  --  96.1 95.3  PLT 373  --  319 303   Basic Metabolic Panel:  Recent Labs Lab 12/28/16 1617 12/28/16 1632 12/29/16 0453 12/30/16 0457  NA 141 142 141 138  K 4.7 4.6 4.2 4.0  CL 110 109 113* 109  CO2 25  --  21* 22  GLUCOSE 96 96 89 94   BUN 25* 26* 25* 21*  CREATININE 1.60* 1.40* 1.26* 1.32*  CALCIUM 9.1  --  7.9* 8.2*   GFR: Estimated Creatinine Clearance (by C-G formula based on SCr of 1.32 mg/dL (H)) Male: 16.1 mL/min (A) Male: 50.5 mL/min (A) Liver Function Tests:  Recent Labs Lab 12/28/16 1617 12/29/16 0453 12/30/16 0457  AST 17 12* 12*  ALT 13* 10* 9*  ALKPHOS 61 50 56  BILITOT 0.8 0.7 0.7  PROT 6.9 5.4* 5.7*  ALBUMIN 3.5 2.7* 2.8*   No results for input(s): LIPASE, AMYLASE in the last 168 hours.  Recent Labs Lab 12/28/16 1617  AMMONIA 16   Coagulation Profile:  Recent Labs Lab 12/28/16 1617  INR 1.10   Cardiac Enzymes:  Recent Labs Lab 12/29/16 0453 12/29/16 0819 12/29/16 1449 12/29/16 1935  TROPONINI 0.03* 0.03* 0.03* 0.03*   BNP (last 3 results) No results for input(s): PROBNP in the last 8760 hours. HbA1C: No results for input(s): HGBA1C in the last 72 hours. CBG:  Recent Labs Lab 12/29/16 2351 12/30/16 0414 12/30/16 0742 12/30/16 1159 12/30/16 1655  GLUCAP 146* 91 94 108* 113*   Lipid Profile: No results for input(s): CHOL, HDL, LDLCALC, TRIG, CHOLHDL, LDLDIRECT in the last 72 hours. Thyroid Function Tests:  Recent Labs  12/29/16 0819  TSH 1.744   Anemia Panel: No results for input(s): VITAMINB12, FOLATE, FERRITIN, TIBC, IRON, RETICCTPCT in the last 72 hours. Urine analysis:    Component Value Date/Time   COLORURINE YELLOW 12/28/2016 1544   APPEARANCEUR CLEAR 12/28/2016 1544   LABSPEC 1.017 12/28/2016 1544   PHURINE 5.0 12/28/2016 1544   GLUCOSEU NEGATIVE 12/28/2016 1544   HGBUR SMALL (A) 12/28/2016 1544   BILIRUBINUR NEGATIVE 12/28/2016 1544   KETONESUR NEGATIVE 12/28/2016 1544   PROTEINUR NEGATIVE 12/28/2016 1544   UROBILINOGEN 0.2 02/14/2013 0956   NITRITE NEGATIVE 12/28/2016 1544   LEUKOCYTESUR NEGATIVE 12/28/2016 1544   Sepsis Labs: @LABRCNTIP (procalcitonin:4,lacticidven:4)  ) Recent Results (from the past 240 hour(s))  Urine culture      Status: None   Collection Time: 12/28/16  4:17 PM  Result Value Ref Range Status   Specimen Description URINE, RANDOM  Final   Special Requests NONE  Final   Culture   Final    NO GROWTH Performed at [facility], [address] [place], [place] [address]    Report Status 12/30/2016 FINAL  Final  Blood culture (routine x 2)     Status: None (Preliminary result)   Collection Time: 12/28/16  6:35 PM  Result Value Ref Range Status   Specimen Description BLOOD RIGHT HAND  Final   Special Requests BOTTLES DRAWN AEROBIC AND ANAEROBIC 5CC  Final   Culture   Final    NO GROWTH 2 DAYS Performed at [facility], [address] [place], [place] [address]    Report Status PENDING  Incomplete  Blood culture (routine x 2)     Status: None (Preliminary result)   Collection Time: 12/28/16  6:42 PM  Result Value Ref Range Status   Specimen Description BLOOD LEFT HAND  Final   Special Requests IN PEDIATRIC BOTTLE 1CC  Final   Culture   Final    NO GROWTH 2 DAYS Performed at [facility], [address] [place], [place] [address]    Report Status PENDING  Incomplete  CSF culture     Status: None (Preliminary result)   Collection Time: 12/28/16  8:33 PM  Result Value Ref Range Status   Specimen Description CSF  Final   Special Requests NONE  Final   Gram Stain   Final    WBC PRESENT, PREDOMINANTLY MONONUCLEAR NO ORGANISMS SEEN CYTOSPIN SMEAR Gram Stain Report Called to,Read Back By and Verified With: J.OXENDINE AT 2149 12/28/16 BY N.THOMPSON    Culture   Final    NO GROWTH 2 DAYS Performed at [facility], [address], [place], [place] [address]    Report Status PENDING  Incomplete         Radiology Studies: No results found.      Scheduled Meds: . acyclovir  625 mg Intravenous Q8H  . allopurinol  100 mg Oral Daily  . aspirin EC  81 mg Oral Daily  . cefTRIAXone (ROCEPHIN)  IV  2 g Intravenous Q24H  . clopidogrel  75 mg Oral Daily  . famotidine   20 mg Oral Daily  . isosorbide mononitrate  15 mg Oral Daily  . levothyroxine  125 mcg Oral QAC breakfast  . metoprolol succinate  25 mg Oral Daily  . oxybutynin  10 mg Oral Daily  . tamsulosin  0.4 mg Oral QHS   Continuous Infusions:    LOS: 2 days    Time spent: 21    Pleas Koch, MD [facility] ([facility]   If 7PM-7AM, please contact night-coverage [email or web address] Password [facility] 12/31/2016, 7:52 AM

## 2016-12-31 NOTE — Progress Notes (Signed)
PT Cancellation Note  Patient Details Name: Adam Gillespie MRN: [id number] DOB: January 15, 1956   Cancelled Treatment:    Reason Eval/Treat Not Completed: Other (comment) (just ambulated in hall with nursing)   Maida Sale E 12/31/2016, 11:27 AM Zenovia Jarred, PT, DPT 12/31/2016 Pager: [phone]

## 2017-01-01 LAB — COMPREHENSIVE METABOLIC PANEL
ALT: 11 U/L — ABNORMAL LOW (ref 17–63)
AST: 22 U/L (ref 15–41)
Albumin: 3 g/dL — ABNORMAL LOW (ref 3.5–5.0)
Alkaline Phosphatase: 77 U/L (ref 38–126)
Anion gap: 10 (ref 5–15)
BILIRUBIN TOTAL: 1.2 mg/dL (ref 0.3–1.2)
BUN: 12 mg/dL (ref 6–20)
CALCIUM: 8.8 mg/dL — AB (ref 8.9–10.3)
CO2: 24 mmol/L (ref 22–32)
CREATININE: 1.25 mg/dL — AB (ref 0.61–1.24)
Chloride: 101 mmol/L (ref 101–111)
GFR calc Af Amer: >60 mL/min (ref >60)
Glucose, Bld: 96 mg/dL (ref 65–99)
Potassium: 4.9 mmol/L (ref 3.5–5.1)
Sodium: 135 mmol/L (ref 135–145)
TOTAL PROTEIN: 6.8 g/dL (ref 6.5–8.1)

## 2017-01-01 LAB — CSF CULTURE W GRAM STAIN

## 2017-01-01 LAB — CBC WITH DIFFERENTIAL/PLATELET
Basophils Absolute: 0 10*3/uL (ref 0.0–0.1)
Basophils Relative: 0 %
EOS ABS: 0.2 10*3/uL (ref 0.0–0.7)
EOS PCT: 1 %
HEMATOCRIT: 37.9 % — AB (ref 39.0–52.0)
HEMOGLOBIN: 12.9 g/dL — AB (ref 13.0–17.0)
Lymphocytes Relative: 8 %
Lymphs Abs: 1.1 10*3/uL (ref 0.7–4.0)
MCH: 32.6 pg (ref 26.0–34.0)
MCHC: 34 g/dL (ref 30.0–36.0)
MCV: 95.7 fL (ref 78.0–100.0)
Monocytes Absolute: 1.1 10*3/uL — ABNORMAL HIGH (ref 0.1–1.0)
Monocytes Relative: 8 %
Neutro Abs: 11.5 10*3/uL — ABNORMAL HIGH (ref 1.7–7.7)
Neutrophils Relative %: 83 %
Platelets: 365 10*3/uL (ref 150–400)
RBC: 3.96 MIL/uL — AB (ref 4.22–5.81)
RDW: 14.8 % (ref 11.5–15.5)
WBC: 14 10*3/uL — AB (ref 4.0–10.5)

## 2017-01-01 LAB — CSF CULTURE: CULTURE: NO GROWTH

## 2017-01-01 LAB — AMMONIA: Ammonia: 12 umol/L (ref 9–35)

## 2017-01-01 MED ORDER — LEVOFLOXACIN 500 MG PO TABS
500.0000 mg | ORAL_TABLET | Freq: Every day | ORAL | Status: DC
  Administered 2017-01-01 – 2017-01-02 (×2): 500 mg via ORAL
  Filled 2017-01-01 (×2): qty 1

## 2017-01-01 MED ORDER — IBUPROFEN 200 MG PO TABS
600.0000 mg | ORAL_TABLET | Freq: Three times a day (TID) | ORAL | Status: DC | PRN
  Administered 2017-01-01: 600 mg via ORAL
  Filled 2017-01-01: qty 3

## 2017-01-01 NOTE — Progress Notes (Signed)
PROGRESS NOTE    Adam Gillespie  LTJ:[id number] DOB: 06/03/1956 DOA: 12/28/2016 PCP: [name], NP  Outpatient Specialists:     Brief Narrative:  61 y/o ? R Carotid stent 05/21/15 Foll Dr Murlean Caller plavix and asaa CAD s/p CABG x 5 2001 AICD for EF 25%-initally 2006-replaced battery 10/29015-Low risk Nuc Stress 07/2014 OISA supposed to be on CPAP Hypothyroid Gout  Admitted to [facility] with lethargy and confusuiion Labs showed leukocytosis CT head neg LP done nothing  Started on broad spectrum acyclovir, Ceftriaxone, Vancomycin  Assessment & Plan:   Principal Problem:   Acute encephalopathy Active Problems:   Ischemic cardiomyopathy   Hypothyroidism   Implantable cardioverter-defibrillator lead failure   Complex sleep apnea syndrome   I acute metabolic encephalopathy-unclear etiology. Treating as infection. Follow BC--the urine culture NGTD,CSF cultures NG after 3 days-stopped acyclovir, did d/c vancomycin, ceftriaxone-->po levaquin 3/17--DO not think this is meningitis.WBC 27--->19--->13-->11. Awaiting repeat labs and narrowing antibiotic soon.  Ammonia was 12 II cardiomyopathy EF 25% 04/2012- Toprol-XL 20 5 daily, hydralazine 0.5 twice a day, Imdur 15 mg daily III ischemic cardiomyopathy post CABG X5 2001-when able continue Plavix 75, aspirin 81 for now on hold--patient not symptomatic with borderline troponin with chest pain IV hypothyroidism continue Synthroid 125 g daily when able. TSH wnl V sleep apnea-supposed to be on CPAP. On admission ABG did not show any carbon dioxide retention VI dysphagia and speech therapy concurs with dys III diet VII presbycusis with severe speech impediment. Monitor VIII CKD 2-3-stable IX Cognitive defcit and slurred speech-chronic.   Full code In-patient pending resolution Tele D/w niece bedside   Consultants:     Procedures:     Antimicrobials:       Subjective:  Fair  No new real issue moving around Some mild  confusion overnight  No other complaint  Objective: Vitals:   12/31/16 0526 12/31/16 1509 12/31/16 2005 01/01/17 0509  BP: 125/64 (!) 142/66 (!) 113/59 137/63  Pulse: 75 69 76 84  Resp: (!) 24 (!) 22 (!) 22 20  Temp: 98.1 F (36.7 C) 99.1 F (37.3 C) 99.2 F (37.3 C) 98.8 F (37.1 C)  TempSrc: Oral Oral Oral Oral  SpO2: 96% 97% 91% 99%  Weight:      Height:        Intake/Output Summary (Last 24 hours) at 01/01/17 0750 Last data filed at 12/31/16 1500  Gross per 24 hour  Intake            162.5 ml  Output                0 ml  Net            162.5 ml   Filed Weights   12/28/16 1407 12/29/16 0043  Weight: 62.6 kg (138 lb) 61.9 kg (136 lb 6.4 oz)    Examination:  General exam: Appears calm and comfortable very HOH-seems aboutt he same Respiratory system: Clear to auscultation. Respiratory effort normal. Cardiovascular system: S1 & S2 heard, RRR. No JVD Gastrointestinal system: Abdomen is nondistended, soft and nontender Central nervous system: Poor recall but seems clearer than prior-doesn't appear confused Extremities: Symmetric 5 x 5 power. Skin: No rashes, lesions or ulcers Psychiatry: Judgement and insight appear normal. Mood & affect appropriate.     Data Reviewed: I have personally reviewed following labs and imaging studies  CBC:  Recent Labs Lab 12/28/16 1617 12/28/16 1632 12/29/16 0453 12/30/16 0457 12/31/16 0819 01/01/17 0555  WBC 27.5*  --  19.2* 13.8* 11.7* 14.0*  NEUTROABS 23.9*  --   --   --  9.3* 11.5*  HGB 13.9 15.0 11.5* 10.8* 12.0* 12.9*  HCT 40.5 44.0 34.4* 32.4* 35.5* 37.9*  MCV 95.1  --  96.1 95.3 95.2 95.7  PLT 373  --  319 303 328 365   Basic Metabolic Panel:  Recent Labs Lab 12/28/16 1617 12/28/16 1632 12/29/16 0453 12/30/16 0457 01/01/17 0555  NA 141 142 141 138 135  K 4.7 4.6 4.2 4.0 4.9  CL 110 109 113* 109 101  CO2 25  --  21* 22 24  GLUCOSE 96 96 89 94 96  BUN 25* 26* 25* 21* 12  CREATININE 1.60* 1.40* 1.26* 1.32*  1.25*  CALCIUM 9.1  --  7.9* 8.2* 8.8*   GFR: Estimated Creatinine Clearance (by C-G formula based on SCr of 1.25 mg/dL (H)) Male: 40.9 mL/min (A) Male: 53.3 mL/min (A) Liver Function Tests:  Recent Labs Lab 12/28/16 1617 12/29/16 0453 12/30/16 0457 01/01/17 0555  AST 17 12* 12* 22  ALT 13* 10* 9* 11*  ALKPHOS 61 50 56 77  BILITOT 0.8 0.7 0.7 1.2  PROT 6.9 5.4* 5.7* 6.8  ALBUMIN 3.5 2.7* 2.8* 3.0*   No results for input(s): LIPASE, AMYLASE in the last 168 hours.  Recent Labs Lab 12/28/16 1617 01/01/17 0555  AMMONIA 16 12   Coagulation Profile:  Recent Labs Lab 12/28/16 1617  INR 1.10   Cardiac Enzymes:  Recent Labs Lab 12/29/16 0453 12/29/16 0819 12/29/16 1449 12/29/16 1935  TROPONINI 0.03* 0.03* 0.03* 0.03*   BNP (last 3 results) No results for input(s): PROBNP in the last 8760 hours. HbA1C: No results for input(s): HGBA1C in the last 72 hours. CBG:  Recent Labs Lab 12/29/16 2351 12/30/16 0414 12/30/16 0742 12/30/16 1159 12/30/16 1655  GLUCAP 146* 91 94 108* 113*   Lipid Profile: No results for input(s): CHOL, HDL, LDLCALC, TRIG, CHOLHDL, LDLDIRECT in the last 72 hours. Thyroid Function Tests:  Recent Labs  12/29/16 0819  TSH 1.744   Anemia Panel: No results for input(s): VITAMINB12, FOLATE, FERRITIN, TIBC, IRON, RETICCTPCT in the last 72 hours. Urine analysis:    Component Value Date/Time   COLORURINE YELLOW 12/28/2016 1544   APPEARANCEUR CLEAR 12/28/2016 1544   LABSPEC 1.017 12/28/2016 1544   PHURINE 5.0 12/28/2016 1544   GLUCOSEU NEGATIVE 12/28/2016 1544   HGBUR SMALL (A) 12/28/2016 1544   BILIRUBINUR NEGATIVE 12/28/2016 1544   KETONESUR NEGATIVE 12/28/2016 1544   PROTEINUR NEGATIVE 12/28/2016 1544   UROBILINOGEN 0.2 02/14/2013 0956   NITRITE NEGATIVE 12/28/2016 1544   LEUKOCYTESUR NEGATIVE 12/28/2016 1544   Sepsis Labs: @LABRCNTIP (procalcitonin:4,lacticidven:4)  ) Recent Results (from the past 240 hour(s))  Urine  culture     Status: None   Collection Time: 12/28/16  4:17 PM  Result Value Ref Range Status   Specimen Description URINE, RANDOM  Final   Special Requests NONE  Final   Culture   Final    NO GROWTH Performed at [facility], [address], [place], [place] [address]    Report Status 12/30/2016 FINAL  Final  Blood culture (routine x 2)     Status: None (Preliminary result)   Collection Time: 12/28/16  6:35 PM  Result Value Ref Range Status   Specimen Description BLOOD RIGHT HAND  Final   Special Requests BOTTLES DRAWN AEROBIC AND ANAEROBIC 5CC  Final   Culture   Final    NO GROWTH 3 DAYS Performed at [facility], [address], [place], [place] [address]    Report Status PENDING  Incomplete  Blood culture (routine x 2)     Status: None (Preliminary result)   Collection Time: 12/28/16  6:42 PM  Result Value Ref Range Status   Specimen Description BLOOD LEFT HAND  Final   Special Requests IN PEDIATRIC BOTTLE 1CC  Final   Culture   Final    NO GROWTH 3 DAYS Performed at [facility], [address], [place], [place] [address]    Report Status PENDING  Incomplete  CSF culture     Status: None (Preliminary result)   Collection Time: 12/28/16  8:33 PM  Result Value Ref Range Status   Specimen Description CSF  Final   Special Requests NONE  Final   Gram Stain   Final    WBC PRESENT, PREDOMINANTLY MONONUCLEAR NO ORGANISMS SEEN CYTOSPIN SMEAR Gram Stain Report Called to,Read Back By and Verified With: J.OXENDINE AT 2149 12/28/16 BY N.THOMPSON    Culture   Final    NO GROWTH 3 DAYS Performed at [facility], [address], [place], [place] [address]    Report Status PENDING  Incomplete         Radiology Studies: No results found.      Scheduled Meds: . acyclovir  625 mg Intravenous Q8H  . allopurinol  100 mg Oral Daily  . aspirin EC  81 mg Oral Daily  . cefTRIAXone (ROCEPHIN)  IV  2 g Intravenous Q24H  . clopidogrel  75 mg Oral Daily    . famotidine  20 mg Oral Daily  . isosorbide mononitrate  15 mg Oral Daily  . levothyroxine  125 mcg Oral QAC breakfast  . metoprolol succinate  25 mg Oral Daily  . oxybutynin  10 mg Oral Daily  . tamsulosin  0.4 mg Oral QHS   Continuous Infusions:    LOS: 3 days    Time spent: 61    Pleas Koch, MD [facility] ([facility]   If 7PM-7AM, please contact night-coverage [email or web address] Password [email or web address] 01/01/2017, 7:50 AM

## 2017-01-01 NOTE — Plan of Care (Signed)
Problem: Education: Goal: Knowledge of [place] [facility] Education information/materials will improve Outcome: Completed/Met Date Met: 01/01/17 Patient's niece has improved knowledge of [place] general education information and materials.

## 2017-01-01 NOTE — Progress Notes (Signed)
Patient c/o pain in R knee.  Knee appears slightly swollen, slightly pink, and slightly warmer than L knee.  Dr. Mahala Menghini aware.  Have given patient tylenol, will continue to monitor.

## 2017-01-02 LAB — CBC WITH DIFFERENTIAL/PLATELET
Basophils Absolute: 0 10*3/uL (ref 0.0–0.1)
Basophils Relative: 0 %
EOS PCT: 2 %
Eosinophils Absolute: 0.2 10*3/uL (ref 0.0–0.7)
HCT: 35 % — ABNORMAL LOW (ref 39.0–52.0)
Hemoglobin: 12 g/dL — ABNORMAL LOW (ref 13.0–17.0)
Lymphocytes Relative: 8 %
Lymphs Abs: 1.1 10*3/uL (ref 0.7–4.0)
MCH: 32.3 pg (ref 26.0–34.0)
MCHC: 34.3 g/dL (ref 30.0–36.0)
MCV: 94.3 fL (ref 78.0–100.0)
Monocytes Absolute: 0.8 10*3/uL (ref 0.1–1.0)
Monocytes Relative: 6 %
Neutro Abs: 10.9 10*3/uL — ABNORMAL HIGH (ref 1.7–7.7)
Neutrophils Relative %: 84 %
Platelets: 361 10*3/uL (ref 150–400)
RBC: 3.71 MIL/uL — AB (ref 4.22–5.81)
RDW: 14.9 % (ref 11.5–15.5)
WBC: 13 10*3/uL — AB (ref 4.0–10.5)

## 2017-01-02 LAB — CULTURE, BLOOD (ROUTINE X 2)
Culture: NO GROWTH
Culture: NO GROWTH

## 2017-01-02 MED ORDER — LEVOFLOXACIN 500 MG PO TABS: 500.0000 mg | ORAL_TABLET | Freq: Every day | ORAL | 0 refills | Status: DC

## 2017-01-02 MED ORDER — IBUPROFEN 600 MG PO TABS: 600.0000 mg | ORAL_TABLET | Freq: Three times a day (TID) | ORAL | 0 refills | Status: DC | PRN

## 2017-01-02 NOTE — Discharge Summary (Signed)
Physician Discharge Summary  Adam Gillespie ZOX:[id number] DOB: 03-Oct-1956 DOA: 12/28/2016  PCP: [name], NP  Admit date: 12/28/2016 Discharge date: 01/02/2017  Time spent: 30 minutes  Recommendations for Outpatient Follow-up:  1. Complete levaquin 3/22 2. Need sOP labs in about 1 week 3. Get Eval of R knee if further swelling or pain  Discharge Diagnoses:  Principal Problem:   Acute encephalopathy Active Problems:   Ischemic cardiomyopathy   Hypothyroidism   Implantable cardioverter-defibrillator lead failure   Complex sleep apnea syndrome   Discharge Condition: improved  Diet recommendation:  hh low salt  Filed Weights   12/29/16 0043 01/01/17 1700 01/02/17 0536  Weight: 61.9 kg (136 lb 6.4 oz) 63.4 kg (139 lb 11.2 oz) 62.3 kg (137 lb 5.6 oz)    History of present illness:  61 y/o ? R Carotid stent 05/21/15 Foll Dr Murlean Caller plavix and asaa CAD s/p CABG x 5 2001 AICD for EF 25%-initally 2006-replaced battery 10/29015-Low risk Nuc Stress 07/2014 OISA supposed to be on CPAP Hypothyroid Gout  Admitted to [facility] with lethargy and confusuiion Labs showed leukocytosis CT head neg LP done nothing  Started on broad spectrum acyclovir, Ceftriaxone, Vancomycin  Hospital Course:  Principal Problem:   Acute encephalopathy Active Problems:   Ischemic cardiomyopathy   Hypothyroidism   Implantable cardioverter-defibrillator lead failure   Complex sleep apnea syndrome   I           acute metabolic encephalopathy-unclear etiology. Treating as infection. Follow [facility]--the urine culture NGTD,CSF cultures NG after 3 days-stopped acyclovir, did d/c vancomycin, ceftriaxone-->po levaquin 3/17--DO not think this is meningitis.WBC 27--->19--->13-->11. No source id'd--end date levaquin 3/22--total would receive 10 days by them II          cardiomyopathy EF 25% 04/2012- Toprol-XL 20 5 daily, hydralazine 0.5 twice a day, Imdur 15 mg daily III         ischemic cardiomyopathy post  CABG X5 2001-when able continue Plavix 75, aspirin 81 for now on hold--patient not symptomatic with borderline troponin with chest pain--Lasix d/c this admit IV         hypothyroidism continue Synthroid 125 g daily when able. TSH wnl V          sleep apnea-supposed to be on CPAP. On admission ABG did not show any carbon dioxide retention VI         dysphagia and speech therapy concurs with dys III diet VII        presbycusis with severe speech impediment. Monitor VIII       CKD 2-3-stable IX         Cognitive defcit and slurred speech-chronic.    Discharge Exam: Vitals:   01/01/17 2119 01/02/17 0536  BP: 134/73 116/65  Pulse: 63 71  Resp: 17 18  Temp: 97.8 F (36.6 C) 98.5 F (36.9 C)    General: alert pleasant in nad. No distress no furthe rkne pain Cardiovascular: s1 s 2no m/r/g Respiratory:  Clear no added sound R knee shows no swelling nop redness Ant and post drawers neg, no jopint line tenderness  Discharge Instructions   Discharge Instructions    Diet - low sodium heart healthy    Complete by:  As directed    Increase activity slowly    Complete by:  As directed      Current Discharge Medication List    START taking these medications   Details  ibuprofen (ADVIL,MOTRIN) 600 MG tablet Take 1 tablet (600 mg total) by mouth  every 8 (eight) hours as needed for mild pain or moderate pain. Qty: 30 tablet, Refills: 0    levofloxacin (LEVAQUIN) 500 MG tablet Take 1 tablet (500 mg total) by mouth daily. Qty: 4 tablet, Refills: 0      CONTINUE these medications which have NOT CHANGED   Details  allopurinol (ZYLOPRIM) 100 MG tablet Take 1 tablet (100 mg total) by mouth daily. Qty: 90 tablet, Refills: 3    aspirin EC 81 MG tablet Take 81 mg by mouth daily.    clopidogrel (PLAVIX) 75 MG tablet Take 1 tablet (75 mg total) by mouth daily. Qty: 90 tablet, Refills: 3    famotidine (PEPCID) 20 MG tablet Take 1 tablet (20 mg total) by mouth daily. Qty: 90 tablet,  Refills: 3    hydrALAZINE (APRESOLINE) 25 MG tablet Take 0.5 tablets (12.5 mg total) by mouth 2 (two) times daily. Qty: 90 tablet, Refills: 3    isosorbide mononitrate (IMDUR) 30 MG 24 hr tablet Take 0.5 tablets (15 mg total) by mouth daily. Qty: 90 tablet, Refills: 3    levothyroxine (SYNTHROID, LEVOTHROID) 125 MCG tablet Take 1 tablet (125 mcg total) by mouth daily. Qty: 90 tablet, Refills: 3    metoprolol succinate (TOPROL-XL) 25 MG 24 hr tablet Take 1 tablet (25 mg total) by mouth daily. Qty: 90 tablet, Refills: 3    oxybutynin (DITROPAN-XL) 10 MG 24 hr tablet Take 10 mg by mouth daily.    simvastatin (ZOCOR) 40 MG tablet Take 1 tablet (40 mg total) by mouth at bedtime. Qty: 90 tablet, Refills: 3    tamsulosin (FLOMAX) 0.4 MG CAPS capsule Take 0.4 mg by mouth at bedtime.      STOP taking these medications     furosemide (LASIX) 20 MG tablet        Allergies  Allergen Reactions  . Ace Inhibitors Other (See Comments)    Contributed to hyperkalemia and renal insufficiency in the past (see office note 10/03/2012).        The results of significant diagnostics from this hospitalization (including imaging, microbiology, ancillary and laboratory) are listed below for reference.    Significant Diagnostic Studies: Dg Chest 2 View  Result Date: 12/28/2016 CLINICAL DATA:  Fatigue EXAM: CHEST  2 VIEW COMPARISON:  08/01/2014 FINDINGS: Cardiac shadow is stable but mildly enlarged. Postsurgical changes as well as a defibrillator are again seen and stable. Lungs are well aerated bilaterally. No pneumothorax or sizable effusion is seen. IMPRESSION: No active cardiopulmonary disease. Electronically Signed   By: Alcide Clever M.D.   On: 12/28/2016 16:56   Ct Head Wo Contrast  Result Date: 12/28/2016 CLINICAL DATA:  Altered mental status, fatigue, disorientation EXAM: CT HEAD WITHOUT CONTRAST TECHNIQUE: Contiguous axial images were obtained from the base of the skull through the vertex  without intravenous contrast. COMPARISON:  None. FINDINGS: Brain: No evidence of acute infarction, hemorrhage, hydrocephalus, extra-axial collection or mass lesion/mass effect. Vascular: Mild intracranial atherosclerosis. Skull: Normal. Negative for fracture or focal lesion. Sinuses/Orbits: Partial opacification of the bilateral maxillary sinuses. Mastoid air cells are clear. Other: None. IMPRESSION: No evidence of acute intracranial abnormality. Electronically Signed   By: Charline Bills M.D.   On: 12/28/2016 16:39    Microbiology: Recent Results (from the past 240 hour(s))  Urine culture     Status: None   Collection Time: 12/28/16  4:17 PM  Result Value Ref Range Status   Specimen Description URINE, RANDOM  Final   Special Requests NONE  Final  Culture   Final    NO GROWTH Performed at [facility], [address], [place], [place] [address]    Report Status 12/30/2016 FINAL  Final  Blood culture (routine x 2)     Status: None (Preliminary result)   Collection Time: 12/28/16  6:35 PM  Result Value Ref Range Status   Specimen Description BLOOD RIGHT HAND  Final   Special Requests BOTTLES DRAWN AEROBIC AND ANAEROBIC 5CC  Final   Culture   Final    NO GROWTH 4 DAYS Performed at [facility], [address], [place], [place] [address]    Report Status PENDING  Incomplete  Blood culture (routine x 2)     Status: None (Preliminary result)   Collection Time: 12/28/16  6:42 PM  Result Value Ref Range Status   Specimen Description BLOOD LEFT HAND  Final   Special Requests IN PEDIATRIC BOTTLE 1CC  Final   Culture   Final    NO GROWTH 4 DAYS Performed at [facility], [address], [place], [place] [address]    Report Status PENDING  Incomplete  CSF culture     Status: None   Collection Time: 12/28/16  8:33 PM  Result Value Ref Range Status   Specimen Description CSF  Final   Special Requests NONE  Final   Gram Stain   Final    WBC PRESENT, PREDOMINANTLY  MONONUCLEAR NO ORGANISMS SEEN CYTOSPIN SMEAR Gram Stain Report Called to,Read Back By and Verified With: J.OXENDINE AT 2149 12/28/16 BY N.THOMPSON    Culture   Final    NO GROWTH 3 DAYS Performed at [facility], [address], [place], [place] [address]    Report Status 01/01/2017 FINAL  Final  Hsv Culture And Typing     Status: None   Collection Time: 12/28/16  8:33 PM  Result Value Ref Range Status   HSV Culture/Type Comment  Final    Comment: (NOTE) Positive Culture positive for Herpes simplex virus. Although positive for Herpes simplex virus, it was not possible to determine the type in the initial testing due to the low viral load. Additional testing has been initiated to determine whether this isolate is type 1 or type 2. Performed At: [facility] [address] [place], [place] [id number] Mila Homer MD KG:[id number]    Source of Sample CSF  Final     Labs: Basic Metabolic Panel:  Recent Labs Lab 12/28/16 1617 12/28/16 1632 12/29/16 0453 12/30/16 0457 01/01/17 0555  NA 141 142 141 138 135  K 4.7 4.6 4.2 4.0 4.9  CL 110 109 113* 109 101  CO2 25  --  21* 22 24  GLUCOSE 96 96 89 94 96  BUN 25* 26* 25* 21* 12  CREATININE 1.60* 1.40* 1.26* 1.32* 1.25*  CALCIUM 9.1  --  7.9* 8.2* 8.8*   Liver Function Tests:  Recent Labs Lab 12/28/16 1617 12/29/16 0453 12/30/16 0457 01/01/17 0555  AST 17 12* 12* 22  ALT 13* 10* 9* 11*  ALKPHOS 61 50 56 77  BILITOT 0.8 0.7 0.7 1.2  PROT 6.9 5.4* 5.7* 6.8  ALBUMIN 3.5 2.7* 2.8* 3.0*   No results for input(s): LIPASE, AMYLASE in the last 168 hours.  Recent Labs Lab 12/28/16 1617 01/01/17 0555  AMMONIA 16 12   CBC:  Recent Labs Lab 12/28/16 1617  12/29/16 0453 12/30/16 0457 12/31/16 0819 01/01/17 0555 01/02/17 0518  WBC 27.5*  --  19.2*  13.8* 11.7* 14.0* 13.0*  NEUTROABS 23.9*  --   --   --  9.3* 11.5* 10.9*  HGB 13.9  < > 11.5* 10.8* 12.0* 12.9* 12.0*  HCT 40.5  < > 34.4* 32.4* 35.5*  37.9* 35.0*  MCV 95.1  --  96.1 95.3 95.2 95.7 94.3  PLT 373  --  319 303 328 365 361  < > = values in this interval not displayed. Cardiac Enzymes:  Recent Labs Lab 12/29/16 0453 12/29/16 0819 12/29/16 1449 12/29/16 1935  TROPONINI 0.03* 0.03* 0.03* 0.03*   BNP: BNP (last 3 results)  Recent Labs  12/28/16 1617  BNP 528.9*    ProBNP (last 3 results) No results for input(s): PROBNP in the last 8760 hours.  CBG:  Recent Labs Lab 12/29/16 2351 12/30/16 0414 12/30/16 0742 12/30/16 1159 12/30/16 1655  GLUCAP 146* 91 94 108* 113*       Signed:  Rhetta Mura MD   [facility] 01/02/2017, 9:55 AM

## 2017-01-02 NOTE — Care Management Note (Addendum)
Case Management Note  Patient Details  Name: BRISCOE CARBIN MRN: [id number] Date of Birth: 1956-04-16  Subjective/Objective:      Acute encephalopathy              Discharge Planning: AVS reviewed: Chart reviewed. Spoke to [facility]. States he has RW, wheelchair, hospital bed, handicap accessible home, and bedside commode. She helps him at home.   Pt lives alone but has good family support. No NCM needs identified.   PCP ROSE, MELISSA S   Expected Discharge Date:  01/02/17               Expected Discharge Plan:  Home/Self Care  In-House Referral:  NA  Discharge planning Services  CM Consult  Post Acute Care Choice:  NA Choice offered to:  NA (Niece Elnita Maxwell)  DME Arranged:  N/A DME Agency:  NA  HH Arranged:  NA HH Agency:  NA  Status of Service:  Completed, signed off  If discussed at Long Length of Stay Meetings, dates discussed:    Additional Comments:  Elliot Cousin, RN 01/02/2017, 10:17 AM

## 2017-01-02 NOTE — Care Management Important Message (Signed)
Important Message  Patient Details  Name: MONTELL WINGET MRN: [id number] Date of Birth: 20-Jan-1956   Medicare Important Message Given:  Yes    Elliot Cousin, RN 01/02/2017, 10:32 AM

## 2017-01-03 LAB — HSV CULTURE AND TYPING

## 2017-01-18 ENCOUNTER — Encounter: Payer: Self-pay | Admitting: Family

## 2017-01-24 ENCOUNTER — Other Ambulatory Visit: Payer: Self-pay | Admitting: Nurse Practitioner

## 2017-01-25 ENCOUNTER — Ambulatory Visit (HOSPITAL_COMMUNITY)
Admission: RE | Admit: 2017-01-25 | Discharge: 2017-01-25 | Disposition: A | Discharge status: Home / Self Care | Payer: Medicare Other | Source: Ambulatory Visit | Attending: Family | Admitting: Family

## 2017-01-25 ENCOUNTER — Encounter: Payer: Self-pay | Admitting: Family

## 2017-01-25 ENCOUNTER — Ambulatory Visit (INDEPENDENT_AMBULATORY_CARE_PROVIDER_SITE_OTHER): Payer: Medicare Other | Admitting: Family

## 2017-01-25 VITALS — BP 104/70 | HR 84 | Temp 98.5°F | Resp 16 | Ht 64.0 in | Wt 138.8 lb

## 2017-01-25 DIAGNOSIS — I6523 Occlusion and stenosis of bilateral carotid arteries: Secondary | ICD-10-CM | POA: Diagnosis not present

## 2017-01-25 DIAGNOSIS — Z959 Presence of cardiac and vascular implant and graft, unspecified: Secondary | ICD-10-CM

## 2017-01-25 DIAGNOSIS — I6522 Occlusion and stenosis of left carotid artery: Secondary | ICD-10-CM | POA: Diagnosis not present

## 2017-01-25 DIAGNOSIS — Z720 Tobacco use: Secondary | ICD-10-CM | POA: Diagnosis not present

## 2017-01-25 NOTE — Progress Notes (Signed)
Chief Complaint: Follow up Extracranial Carotid Artery Stenosis   History of Present Illness  Adam Gillespie is a 61 y.o. male patient of Dr. Darrick Penna who presents for follow-up after rightcarotid stent on 05/21/15.  Pt had a large MI at age 45 with 5 vessel CABG and pacer/defib.at that time.He is followed by [name], NP, cardiology.   The patient denies any history of TIA or stroke symptoms, specifically he denies a history of amaurosis fugax or monocular blindness, unilateral facial drooping,  hemiplegia, or receptive or expressive aphasia.    Niece states there is a strong family hx of stroke, MI, peripheral vascular disease, including at young ages.   Pt Diabetic: no Pt smoker: non-smoker, but he has used a small amount of smokeless tobacco, according to his niece, for many years, and she states he will not stop this use  Pt meds include: Statin : yes ASA: yes Other anticoagulants/antiplatelets: Plavix   Past Medical History:  Diagnosis Date  . 9147 lead 05/01/2014  . ACE inhibitor intolerance    Renal insufficiency/hyperkalemia  . Anoxic encephalopathy (HCC)    POSTOP FROM 2001  . Automatic implantable cardiac defibrillator -Medtronic 04/23/2011   a. 07/2014 gen change -  Medtronic EVERA single chamber ICD, serial number  [id number].  . Carotid stenosis, right    60-79% right, 40 - 59% left - needs dopplers in June 2014  . CHF (congestive heart failure) (HCC)   . Coronary artery disease   . Gout   . Hearing deficit    Bilateral ears  . Heart murmur   . Hyperlipidemia   . Hypothyroidism   . Ischemic cardiomyopathy    Remote MI with CABG x 5 in 2001; Myocardial perfusion imaging EF 26% old infarct anterior wall. 04/2010  . LV dysfunction    EF is 25% per echo in July 2013  . 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 Sleep apnea 11-2014    Social History Social History  Substance Use Topics  . Smoking status: Never Smoker  . Smokeless tobacco:  Current User    Types: Chew     Comment: snuff  . Alcohol use No    Family History Family History  Problem Relation Age of Onset  . Heart attack Mother   . Cancer Father   . Emphysema Father   . Cancer Brother   . Asthma Sister   . Emphysema Sister   . Heart attack Maternal Grandfather   . Stroke Maternal Grandmother     Surgical History Past Surgical History:  Procedure Laterality Date  . ADENOIDECTOMY    . BALLOON DILATION N/A 07/11/2013   Procedure: BALLOON DILATION;  Surgeon: Shirley Friar, MD;  Location: [facility] ENDOSCOPY;  Service: Endoscopy;  Laterality: N/A;  . CARDIAC CATHETERIZATION  08/05/2000   THERE IS ANTERIOR AND APICAL AKINESIS. EF IS ESTIMATED 20%, WITH INFEROBASILAR PORTIONS CONTRACTING REASONABLY WELL.  Marland Kitchen CARDIAC DEFIBRILLATOR PLACEMENT    . COLONOSCOPY WITH PROPOFOL N/A 01/09/2013   Procedure: COLONOSCOPY WITH PROPOFOL;  Surgeon: Shirley Friar, MD;  Location: [facility] ENDOSCOPY;  Service: Endoscopy;  Laterality: N/A;  . CORONARY ARTERY BYPASS GRAFT     x5. lima to the lad, saphenous vein graft to the intermediate and obtuse mariginal, and a saphenous vein graft to the acute mariginal and distal right coronary artery  . ESOPHAGEAL DILATION  2015  . ESOPHAGOGASTRODUODENOSCOPY N/A 07/11/2013   Procedure: ESOPHAGOGASTRODUODENOSCOPY (EGD);  Surgeon: Shirley Friar, MD;  Location: [facility] ENDOSCOPY;  Service: Endoscopy;  Laterality: N/A;  . IMPLANTABLE CARDIOVERTER DEFIBRILLATOR (ICD) GENERATOR CHANGE N/A 07/31/2014   Procedure: ICD GENERATOR CHANGE;  Surgeon: Duke Salvia, MD;  Location: [facility] CATH LAB;  Service: Cardiovascular;  Laterality: N/A;  . LEAD REVISION N/A 07/31/2014   Procedure: LEAD REVISION;  Surgeon: Duke Salvia, MD;  Location: [facility] CATH LAB;  Service: Cardiovascular;  Laterality: N/A;  . PERIPHERAL VASCULAR CATHETERIZATION N/A 05/21/2015   Procedure: Carotid PTA/Stent Intervention;  Surgeon: Sherren Kerns, MD;  Location: [facility] INVASIVE CV LAB;   Service: Cardiovascular;  Laterality: N/A;  . SAVORY DILATION N/A 07/11/2013   Procedure: SAVORY DILATION;  Surgeon: Shirley Friar, MD;  Location: [facility] ENDOSCOPY;  Service: Endoscopy;  Laterality: N/A;  . TONSILLECTOMY      Allergies  Allergen Reactions  . Ace Inhibitors Other (See Comments)    Contributed to hyperkalemia and renal insufficiency in the past (see office note 10/03/2012).      Current Outpatient Prescriptions  Medication Sig Dispense Refill  . allopurinol (ZYLOPRIM) 100 MG tablet Take 1 tablet (100 mg total) by mouth daily. 90 tablet 3  . aspirin EC 81 MG tablet Take 81 mg by mouth daily.    . clopidogrel (PLAVIX) 75 MG tablet Take 1 tablet (75 mg total) by mouth daily. 90 tablet 3  . famotidine (PEPCID) 20 MG tablet Take 1 tablet (20 mg total) by mouth daily. 90 tablet 3  . hydrALAZINE (APRESOLINE) 25 MG tablet Take 0.5 tablets (12.5 mg total) by mouth 2 (two) times daily. 90 tablet 3  . isosorbide mononitrate (IMDUR) 30 MG 24 hr tablet Take 0.5 tablets (15 mg total) by mouth daily. 90 tablet 3  . levothyroxine (SYNTHROID, LEVOTHROID) 125 MCG tablet Take 1 tablet (125 mcg total) by mouth daily. 90 tablet 3  . metoprolol succinate (TOPROL-XL) 25 MG 24 hr tablet Take 1 tablet (25 mg total) by mouth daily. 90 tablet 3  . oxybutynin (DITROPAN-XL) 10 MG 24 hr tablet Take 10 mg by mouth daily.    . simvastatin (ZOCOR) 40 MG tablet Take 1 tablet (40 mg total) by mouth at bedtime. 90 tablet 3  . tamsulosin (FLOMAX) 0.4 MG CAPS capsule Take 0.4 mg by mouth at bedtime.     No current facility-administered medications for this visit.     Review of Systems : See HPI for pertinent positives and negatives.  Physical Examination  Vitals:   01/25/17 1034 01/25/17 1038  BP: 92/63 104/70  Pulse: 84   Resp: 16   Temp: 98.5 F (36.9 C)   TempSrc: Oral   SpO2: 96%   Weight: 138 lb 12.8 oz (63 kg)   Height: 5\' 4"  (1.626 m)    Body mass index is 23.82 kg/m.  General: WDWN  male in NAD, appears older than stated age, long grey beard.  GAIT: normal Eyes: PERRLA Pulmonary:  Respirations are non-labored, good air movement, CTAB  Cardiac: regular rhythm, no detected murmur. Pacemaker/defibrillator situated at left upper chest, subcutaneous.  VASCULAR EXAM Carotid Bruits Right Left   Negative Negative    Aorta is not palpable. Radial pulses are 2+ palpable and equal.  LE Pulses Right Left       POPLITEAL  not palpable   not palpable       POSTERIOR TIBIAL   palpable    palpable        DORSALIS PEDIS      ANTERIOR TIBIAL  palpable   palpable     Gastrointestinal: soft, nontender, BS WNL, no r/g, no palpable masses.  Musculoskeletal: no muscle atrophy/wasting. M/S 5/5 throughout, extremities without ischemic changes.  Neurologic: A&O X 3; Appropriate Affect, Speech is normal CN 2-12 intact except is hard of hearing, pain and light touch intact in extremities, Motor exam as listed above.    Assessment: Adam Gillespie is a 62 y.o. male who is s/p rightcarotid stent on 05/21/15. He has no history of stroke or TIA, but did have an MI at age 67 with 5 vessel CABG, has a Facilities manager.  He does not have DM but has used a small amount of smokeless tobacco for many years with no intent to quit.  He has a strong family hx of CAD, PAD, some at early ages.  DATA Today's carotid duplex suggests a patent right ICA stent with no restenosis within the stent.  Left ICA stenosis of <40%. Distal left CCA stenosis of <50% which extends into the origin of the ICA.  Velocities of the distal internal carotid arteries are not as elevated today as shown in the previous exam on 07-01-16.    Plan: Follow-up in 6 months with Carotid Duplex scan   I discussed in depth with the patient the nature of  atherosclerosis, and emphasized the importance of maximal medical management including strict control of blood pressure, blood glucose, and lipid levels, obtaining regular exercise, and cessation of tobacco use.  The patient is aware that without maximal medical management the underlying atherosclerotic disease process will progress, limiting the benefit of any interventions. The patient was given information about stroke prevention and what symptoms should prompt the patient to seek immediate medical care. Thank you for allowing [place] to participate in this patient's care.  Charisse March, RN, MSN, FNP-C [facility] Office: [phone]  Clinic Physician: Early  01/25/17 10:45 AM

## 2017-01-25 NOTE — Patient Instructions (Signed)
Stroke Prevention Some medical conditions and behaviors are associated with an increased chance of having a stroke. You may prevent a stroke by making healthy choices and managing medical conditions. How can I reduce my risk of having a stroke?  Stay physically active. Get at least 30 minutes of activity on most or all days.  Do not smoke. It may also be helpful to avoid exposure to secondhand smoke.  Limit alcohol use. Moderate alcohol use is considered to be:  No more than 2 drinks per day for men.  No more than 1 drink per day for nonpregnant women.  Eat healthy foods. This involves:  Eating 5 or more servings of fruits and vegetables a day.  Making dietary changes that address high blood pressure (hypertension), high cholesterol, diabetes, or obesity.  Manage your cholesterol levels.  Making food choices that are high in fiber and low in saturated fat, trans fat, and cholesterol may control cholesterol levels.  Take any prescribed medicines to control cholesterol as directed by your health care provider.  Manage your diabetes.  Controlling your carbohydrate and sugar intake is recommended to manage diabetes.  Take any prescribed medicines to control diabetes as directed by your health care provider.  Control your hypertension.  Making food choices that are low in salt (sodium), saturated fat, trans fat, and cholesterol is recommended to manage hypertension.  Ask your health care provider if you need treatment to lower your blood pressure. Take any prescribed medicines to control hypertension as directed by your health care provider.  If you are 18-39 years of age, have your blood pressure checked every 3-5 years. If you are 40 years of age or older, have your blood pressure checked every year.  Maintain a healthy weight.  Reducing calorie intake and making food choices that are low in sodium, saturated fat, trans fat, and cholesterol are recommended to manage  weight.  Stop drug abuse.  Avoid taking birth control pills.  Talk to your health care provider about the risks of taking birth control pills if you are over 35 years old, smoke, get migraines, or have ever had a blood clot.  Get evaluated for sleep disorders (sleep apnea).  Talk to your health care provider about getting a sleep evaluation if you snore a lot or have excessive sleepiness.  Take medicines only as directed by your health care provider.  For some people, aspirin or blood thinners (anticoagulants) are helpful in reducing the risk of forming abnormal blood clots that can lead to stroke. If you have the irregular heart rhythm of atrial fibrillation, you should be on a blood thinner unless there is a good reason you cannot take them.  Understand all your medicine instructions.  Make sure that other conditions (such as anemia or atherosclerosis) are addressed. Get help right away if:  You have sudden weakness or numbness of the face, arm, or leg, especially on one side of the body.  Your face or eyelid droops to one side.  You have sudden confusion.  You have trouble speaking (aphasia) or understanding.  You have sudden trouble seeing in one or both eyes.  You have sudden trouble walking.  You have dizziness.  You have a loss of balance or coordination.  You have a sudden, severe headache with no known cause.  You have new chest pain or an irregular heartbeat. Any of these symptoms may represent a serious problem that is an emergency. Do not wait to see if the symptoms will go away.   Get medical help at once. Call your local emergency services (911 in [place] Do not drive yourself to the hospital. This information is not intended to replace advice given to you by your health care provider. Make sure you discuss any questions you have with your health care provider. Document Released: 11/11/2004 Document Revised: 03/11/2016 Document Reviewed: 04/06/2013 Elsevier  Interactive Patient Education  2017 Elsevier Inc.     Preventing Cerebrovascular Disease Arteries are blood vessels that carry blood that contains oxygen from the heart to all parts of the body. Cerebrovascular disease affects arteries that supply the brain. Any condition that blocks or disrupts blood flow to the brain can cause cerebrovascular disease. Brain cells that lose blood supply start to die within minutes (stroke). Stroke is the main danger of cerebrovascular disease. Atherosclerosis and high blood pressure are common causes of cerebrovascular disease. Atherosclerosis is narrowing and hardening of an artery that results when fat, cholesterol, calcium, or other substances (plaque) build up inside an artery. Plaque reduces blood flow through the artery. High blood pressure increases the risk of bleeding inside the brain. Making diet and lifestyle changes to prevent atherosclerosis and high blood pressure lowers your risk of cerebrovascular disease. What nutrition changes can be made?  Eat more fruits, vegetables, and whole grains.  Reduce how much saturated fat you eat. To do this, eat less red meat and fewer full-fat dairy products.  Eat healthy proteins instead of red meat. Healthy proteins include:  Fish. Eat fish that contains heart-healthy omega-3 fatty acids, twice a week. Examples include salmon, albacore tuna, mackerel, and herring.  Chicken.  Nuts.  Low-fat or nonfat yogurt.  Avoid processed meats, like bacon and lunchmeat.  Avoid foods that contain:  A lot of sugar, such as sweets and drinks with added sugar.  A lot of salt (sodium). Avoid adding extra salt to your food, as told by your health care provider.  Trans fats, such as margarine and baked goods. Trans fats may be listed as "partially hydrogenated oils" on food labels.  Check food labels to see how much sodium, sugar, and trans fats are in foods.  Use vegetable oils that contain low amounts of  saturated fat, such as olive oil or canola oil. What lifestyle changes can be made?  Drink alcohol in moderation. This means no more than 1 drink a day for nonpregnant women and 2 drinks a day for men. One drink equals 12 oz of beer, 5 oz of wine, or 1 oz of hard liquor.  If you are overweight, ask your health care provider to recommend a weight-loss plan for you. Losing 5-10 lb (2.2-4.5 kg) can reduce your risk of diabetes, atherosclerosis, and high blood pressure.  Exercise for 30?60 minutes on most days, or as much as told by your health care provider.  Do moderate-intensity exercise, such as brisk walking, bicycling, and water aerobics. Ask your health care provider which activities are safe for you.  Do not use any products that contain nicotine or tobacco, such as cigarettes and e-cigarettes. If you need help quitting, ask your health care provider. Why are these changes important? Making these changes lowers your risk of many diseases that can cause cerebrovascular disease and stroke. Stroke is a leading cause of death and disability. Making these changes also improves your overall health and quality of life. What can I do to lower my risk? The following factors make you more likely to develop cerebrovascular disease:  Being overweight.  Smoking.  Being physically inactive.    Eating a high-fat diet.  Having certain health conditions, such as:  Diabetes.  High blood pressure.  Heart disease.  Atherosclerosis.  High cholesterol.  Sickle cell disease. Talk with your health care provider about your risk for cerebrovascular disease. Work with your health care provider to control diseases that you have that may contribute to cerebrovascular disease. Your health care provider may prescribe medicines to help prevent major causes of cerebrovascular disease. Where to find more information: Learn more about preventing cerebrovascular disease from:  National Heart, Lung, and  Blood Institute: www.nhlbi.nih.gov/health/health-topics/topics/stroke  Centers for Disease Control and Prevention: cdc.gov/stroke/about.htm Summary  Cerebrovascular disease can lead to a stroke.  Atherosclerosis and high blood pressure are major causes of cerebrovascular disease.  Making diet and lifestyle changes can reduce your risk of cerebrovascular disease.  Work with your health care provider to get your risk factors under control to reduce your risk of cerebrovascular disease. This information is not intended to replace advice given to you by your health care provider. Make sure you discuss any questions you have with your health care provider. Document Released: 10/19/2015 Document Revised: 04/23/2016 Document Reviewed: 10/19/2015 Elsevier Interactive Patient Education  2017 Elsevier Inc.      What You Need to Know About Smokeless Tobacco Use Tobacco use is one of the leading causes of cancer and other chronic health problems. Smokeless tobacco is tobacco that is put directly into the mouth instead of being smoked. It may also be called chewing tobacco or snuff. Smokeless tobacco is made from the leaves of tobacco plants and it comes in several forms:  Loose, dry leaves, plugs, or twists.  Moist pouches.  Dissolving lozenges or strips. Chewing, sucking, or holding the tobacco in your mouth causes your mouth to make more saliva. The saliva mixes with the tobacco to make "tobacco juice" that is swallowed or spit out. How can smokeless tobacco affect me? Using smokeless tobacco:  Increases your risk of developing cancer. Smokeless tobacco contains at least 28 different types of cancer-causing chemicals (carcinogens).  Increases your chances of developing other long-term health problems, including high blood pressure, heart disease, stroke, and dental problems.  Can make you become addicted. Nicotine is one of the chemicals in tobacco. When you chew tobacco, you absorb  nicotine from the tobacco juice. This can make you feel more alert than usual.  Can cause problems with pregnancy. Pregnant women who use smokeless tobacco are more likely to miscarry or deliver a baby too early (premature delivery).  Can affect the appearance and health of your mouth. Using smokeless tobacco may cause bad breath, yellow-brown teeth, mouth sores, cracking and bleeding lips, gum recession, and lesions on the soft tissues of your mouth (leukoplakia). What are the benefits of not using smokeless tobacco? The benefits of not using smokeless tobacco include:  A healthy mind because:  You avoid addiction.  A healthy body because:  You avoid dental problems.  You promote healthy pregnancy.  You avoid long-term health problems.  A healthy wallet because:  You avoid costs of buying tobacco.  You avoid health care costs in the future.  A healthy family because:  You avoid accidental poisoning of children in your household. What can happen if I continue to use smokeless tobacco? If you continue to use smokeless tobacco, you will increase your risk for developing certain cancers. These include:  Tongue.  Lips, mouth, and gums.  Throat (esophagus) and voice box (larynx).  Stomach.  Pancreas.  Bladder.  Colon. Long-term use   of smokeless tobacco can also lead to:  High blood pressure, heart disease, and stroke.  Gum disease, gum recession, and bone loss around the teeth.  Tooth decay. How do I quit using smokeless tobacco? Quitting the use of smokeless tobacco can be hard, but it can be done. Follow these steps:  Pick a date to quit. Set a date within the next two weeks. This gives you time to prepare.  Write down the reasons why you are quitting. Keep this list in places where you will see it often, such as on your bathroom mirror or in your car or wallet.  Identify the people, places, things, and activities that make you want to use tobacco (triggers)  and avoid them.  Get rid of any tobacco you have and remove any tobacco smells. To do this:  Throw away all containers of tobacco at home, at work, and in your car.  Throw away any other items that you use regularly when you chew tobacco.  Clean your car and make sure to remove all tobacco-related items.  Clean your home, including curtains and carpets.  Tell your family, friends, and coworkers that you are quitting. This can make quitting easier.  Ask your health care provider for help quitting smokeless tobacco. This may involve treatment. Find out what treatment options are covered by your health insurance.  Keep track of how many days have passed since you quit. Remembering how long and hard you have worked to quit can help you avoid using tobacco again. Where can I get support? Ask your health care provider if there is a local support group for quitting smokeless tobacco. Where can I get more information? You can learn more about the risks of using smokeless tobacco and the benefits of quitting from these sources:  National Cancer Institute: www.cancer.gov  American Cancer Society: www.cancer.org When should I seek medical care? Seek medical care if you have:  White or other discolored patches in your mouth.  Difficulty swallowing.  A change in your voice.  Unexplained weight loss.  Stomach pain, nausea, or vomiting. Summary  Smokeless tobacco contains at least 28 different chemicals that are known to cause cancer (carcinogen).  Nicotine is an addictive chemical in smokeless tobacco.  When you quit using smokeless tobacco, you lower your risk of developing cancer. This information is not intended to replace advice given to you by your health care provider. Make sure you discuss any questions you have with your health care provider. Document Released: 03/08/2011 Document Revised: 05/29/2016 Document Reviewed: 05/16/2015 Elsevier Interactive Patient Education  2017  Elsevier Inc.  

## 2017-01-26 ENCOUNTER — Encounter (HOSPITAL_COMMUNITY): Payer: Medicare Other

## 2017-01-26 ENCOUNTER — Ambulatory Visit: Payer: Medicare Other | Admitting: Family

## 2017-01-27 ENCOUNTER — Ambulatory Visit: Payer: Medicare Other | Admitting: Family

## 2017-01-27 ENCOUNTER — Encounter (HOSPITAL_COMMUNITY): Payer: Medicare Other

## 2017-03-11 ENCOUNTER — Other Ambulatory Visit: Payer: Self-pay | Admitting: Nurse Practitioner

## 2017-03-14 ENCOUNTER — Other Ambulatory Visit: Payer: Self-pay | Admitting: Nurse Practitioner

## 2017-03-15 ENCOUNTER — Encounter: Payer: Self-pay | Admitting: Nurse Practitioner

## 2017-03-15 ENCOUNTER — Ambulatory Visit (INDEPENDENT_AMBULATORY_CARE_PROVIDER_SITE_OTHER): Payer: Medicare Other | Admitting: Nurse Practitioner

## 2017-03-15 ENCOUNTER — Encounter (INDEPENDENT_AMBULATORY_CARE_PROVIDER_SITE_OTHER): Payer: Self-pay

## 2017-03-15 VITALS — BP 110/68 | HR 68 | Ht 64.0 in | Wt 137.4 lb

## 2017-03-15 DIAGNOSIS — I255 Ischemic cardiomyopathy: Secondary | ICD-10-CM

## 2017-03-15 MED ORDER — ISOSORBIDE MONONITRATE ER 30 MG PO TB24: 15.0000 mg | ORAL_TABLET | Freq: Every day | ORAL | 3 refills | Status: DC

## 2017-03-15 NOTE — Patient Instructions (Addendum)
We will be checking the following labs today - BMET, CBC, HPF, Lipids, TSH and BNP   Medication Instructions:    Continue with your current medicines.   I sent in your refills today.     Testing/Procedures To Be Arranged:  Echocardiogram  Follow-Up:   See me in 3 months    Other Special Instructions:   N/A    If you need a refill on your cardiac medications before your next appointment, please call your pharmacy.   Call the [facility] office at [phone] if you have any questions, problems or concerns.

## 2017-03-15 NOTE — Progress Notes (Signed)
CARDIOLOGY OFFICE NOTE  Date:  03/15/2017    Adam Gillespie Date of Birth: August 09, 1956 Medical Record #[id number]  PCP:  [name], NP  Cardiologist:  [name]  Chief Complaint  Patient presents with  . Cardiomyopathy    Follow up visit - seen for [name]    History of Present Illness: Adam Gillespie is a 61 y.o. male who presents today for a follow up visit. Seen for [name]. Former patient of Dr. [name].   Adam Gillespie has a hx of CAD, status post CABG in 2001, ischemic cardiomyopathy with an EF of 25%, status post AICD, hypothyroidism, HL, GERD, CKD, gout, hypothyroidism. 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 He has a tendency towards hyperkalemia and is not on an ACE inhibitor. Myoview 07/2014 low risk. His last echo was in 2013 - EF of 25%.   Seen back in March of 2016 by [name], PA - had progressive carotid disease on the right and was referred to [facility]. Now s/p stenting of the right carotid. He is on aspirin and Brilinta. Complicated by hypotension. Most of his cardiac medicines were stopped but we have been able to get him titrated back on up.   Seen several times last year in 2017 and he was doing ok. Mom had been placed in SNF and subsequently died. Brother had passed. Niece is his POA and helps in his care. I last say him in July.   He was admitted back in March (actually was getting ready to come here) with acute metabolic encephalopathy - unclear etiology but then sounds like he was treated for presumed right knee infection. This has not come back. Blood cultures negative.   Comes back today. He is here with his niece who is his power of attorney. She gives most of the history. He seems back to his baseline. No fevers, chills, night sweats. His knee has improved but his mobility is an issue - using a walker at times - she wishes he would use it all the time. He has had a fall a few months ago but this  was when he was out in extreme weather. But he does stagger. Still lives alone but she and his sister come by multiple times during the day. She notes more issues with breathing and lethargy. Short of breath with minimal activities. Occasional swelling. Sleeps most of the day. She notes his speech seems more slurred and more difficult to understand at times.  He is not able to have an MRI. Had a negative CT of the head back in March. Was in NSR on EKG from 12/2016 as well. Their goal has been comfort care with conservative measures but he is not a DNR. She notes an unpleasant experience at [facility] last month and does not wish to take him back there.   Past Medical History:  Diagnosis Date  . 1914 lead 05/01/2014  . ACE inhibitor intolerance    Renal insufficiency/hyperkalemia  . Anoxic encephalopathy (HCC)    POSTOP FROM 2001  . Automatic implantable cardiac defibrillator -Medtronic 04/23/2011   a. 07/2014 gen change -  Medtronic EVERA single chamber ICD, serial number  [id number] H.  . Carotid stenosis, right    60-79% right, 40 - 59% left - needs dopplers in June 2014  . CHF (congestive heart failure) (HCC)   . Coronary artery disease   . Gout   . Hearing deficit    Bilateral ears  .  Heart murmur   . Hyperlipidemia   . Hypothyroidism   . Ischemic cardiomyopathy    Remote MI with CABG x 5 in 2001; Myocardial perfusion imaging EF 26% old infarct anterior wall. 04/2010  . LV dysfunction    EF is 25% per echo in July 2013  . 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 Sleep apnea 11-2014    Past Surgical History:  Procedure Laterality Date  . ADENOIDECTOMY    . BALLOON DILATION N/A 07/11/2013   Procedure: BALLOON DILATION;  Surgeon: Shirley Friar, MD;  Location[facility] ENDOSCOPY;  Service: Endoscopy;  Laterality: N/A;  . CARDIAC CATHETERIZATION  08/05/2000   THERE IS ANTERIOR AND APICAL AKINESIS. EF IS ESTIMATED 20%, WITH INFEROBASILAR PORTIONS CONTRACTING REASONABLY WELL.  Marland Kitchen CARDIAC  DEFIBRILLATOR PLACEMENT    . COLONOSCOPY WITH PROPOFOL N/A 01/09/2013   Procedure: COLONOSCOPY WITH PROPOFOL;  Surgeon: Shirley Friar, MD;  Location: [facility] ENDOSCOPY;  Service: Endoscopy;  Laterality: N/A;  . CORONARY ARTERY BYPASS GRAFT     x5. lima to the lad, saphenous vein graft to the intermediate and obtuse mariginal, and a saphenous vein graft to the acute mariginal and distal right coronary artery  . ESOPHAGEAL DILATION  2015  . ESOPHAGOGASTRODUODENOSCOPY N/A 07/11/2013   Procedure: ESOPHAGOGASTRODUODENOSCOPY (EGD);  Surgeon: Shirley Friar, MD;  Location: [facility] ENDOSCOPY;  Service: Endoscopy;  Laterality: N/A;  . IMPLANTABLE CARDIOVERTER DEFIBRILLATOR (ICD) GENERATOR CHANGE N/A 07/31/2014   Procedure: ICD GENERATOR CHANGE;  Surgeon: Duke Salvia, MD;  Location: [facility] CATH LAB;  Service: Cardiovascular;  Laterality: N/A;  . LEAD REVISION N/A 07/31/2014   Procedure: LEAD REVISION;  Surgeon: Duke Salvia, MD;  Location: [facility] CATH LAB;  Service: Cardiovascular;  Laterality: N/A;  . PERIPHERAL VASCULAR CATHETERIZATION N/A 05/21/2015   Procedure: Carotid PTA/Stent Intervention;  Surgeon: Sherren Kerns, MD;  Location: [facility] INVASIVE CV LAB;  Service: Cardiovascular;  Laterality: N/A;  . SAVORY DILATION N/A 07/11/2013   Procedure: SAVORY DILATION;  Surgeon: Shirley Friar, MD;  Location: [facility] ENDOSCOPY;  Service: Endoscopy;  Laterality: N/A;  . TONSILLECTOMY       Medications: Current Outpatient Prescriptions  Medication Sig Dispense Refill  . allopurinol (ZYLOPRIM) 100 MG tablet TAKE 1 TABLET BY MOUTH  DAILY 90 tablet 3  . aspirin EC 81 MG tablet Take 81 mg by mouth daily.    . clopidogrel (PLAVIX) 75 MG tablet TAKE 1 TABLET BY MOUTH  DAILY 90 tablet 3  . famotidine (PEPCID) 20 MG tablet TAKE 1 TABLET BY MOUTH  DAILY 90 tablet 3  . furosemide (LASIX) 20 MG tablet TAKE 1 TABLET BY MOUTH 3  TIMES WEEKLY ON TUESDAY,  THURSDAY, AND SATURDAY 39 tablet 9  . hydrALAZINE (APRESOLINE) 25 MG  tablet TAKE ONE-HALF TABLET BY  MOUTH TWO TIMES DAILY 90 tablet 3  . isosorbide mononitrate (IMDUR) 30 MG 24 hr tablet Take 0.5 tablets (15 mg total) by mouth daily. 90 tablet 3  . levothyroxine (SYNTHROID, LEVOTHROID) 125 MCG tablet TAKE 1 TABLET BY MOUTH  DAILY 90 tablet 3  . metoprolol succinate (TOPROL-XL) 25 MG 24 hr tablet TAKE 1 TABLET BY MOUTH  DAILY 90 tablet 3  . oxybutynin (DITROPAN-XL) 10 MG 24 hr tablet Take 10 mg by mouth daily.    . simvastatin (ZOCOR) 40 MG tablet TAKE 1 TABLET BY MOUTH AT  BEDTIME 90 tablet 3  . tamsulosin (FLOMAX) 0.4 MG CAPS capsule Take 0.4 mg by mouth at bedtime.    Marland Kitchen  traMADol (ULTRAM) 50 MG tablet Take 50 mg by mouth every 6 (six) hours as needed for moderate pain (right knee).     No current facility-administered medications for this visit.     Allergies: Allergies  Allergen Reactions  . Ace Inhibitors Other (See Comments)    Contributed to hyperkalemia and renal insufficiency in the past (see office note 10/03/2012).      Social History: The patient  reports that he has never smoked. His smokeless tobacco use includes Chew. He reports that he does not drink alcohol or use drugs.   Family History: The patient's family history includes Asthma in his sister; Cancer in his brother and father; Emphysema in his father and sister; Heart attack in his maternal grandfather and mother; Stroke in his maternal grandmother.   Review of Systems: Please see the history of present illness.   Otherwise, the review of systems is positive for none.   All other systems are reviewed and negative.   Physical Exam: VS:  BP 110/68 (BP Location: Left Arm, Patient Position: Sitting, Cuff Size: Normal)   Pulse 68   Ht 5\' 4"  (1.626 m)   Wt 137 lb 6.4 oz (62.3 kg)   BMI 23.58 kg/m  .  BMI Body mass index is 23.58 kg/m.  Wt Readings from Last 3 Encounters:  03/15/17 137 lb 6.4 oz (62.3 kg)  01/25/17 138 lb 12.8 oz (63 kg)  01/02/17 137 lb 5.6 oz (62.3 kg)     General: Pleasant. Very hard of hearing. Cognitively slow but alert and in no acute distress.  His speech seems more slurred and harder to understand. He looks like he has really aged.  HEENT: Normal.  Neck: Supple, no JVD, carotid bruits, or masses noted.  Cardiac: Heart tones are distant. No edema.  Respiratory:  Lungs are clear to auscultation bilaterally with normal work of breathing.  GI: Soft and nontender.  MS: No deformity or atrophy. Gait and ROM intact but he does stagger.  Skin: Warm and dry. Color is normal.  Neuro:  Strength and sensation are intact and no gross focal deficits noted.  Psych: Alert, appropriate and with normal affect.   LABORATORY DATA:  EKG:  EKG is not ordered today.  Lab Results  Component Value Date   WBC 13.0 (H) 01/02/2017   HGB 12.0 (L) 01/02/2017   HCT 35.0 (L) 01/02/2017   PLT 361 01/02/2017   GLUCOSE 96 01/01/2017   CHOL 152 04/28/2016   TRIG 157 (H) 04/28/2016   HDL 40 04/28/2016   LDLDIRECT 87.5 06/03/2014   LDLCALC 81 04/28/2016   ALT 11 (L) 01/01/2017   AST 22 01/01/2017   NA 135 01/01/2017   K 4.9 01/01/2017   CL 101 01/01/2017   CREATININE 1.25 (H) 01/01/2017   BUN 12 01/01/2017   CO2 24 01/01/2017   TSH 1.744 12/29/2016   INR 1.10 12/28/2016   HGBA1C 5.8 (H) 02/15/2013    BNP (last 3 results)  Recent Labs  12/28/16 1617  BNP 528.9*    ProBNP (last 3 results) No results for input(s): PROBNP in the last 8760 hours.   Other Studies Reviewed Today:  Carotid duplex 4/2018suggests a patent right ICA stent with no restenosis within the stent.  Distal (post stent) right ICA stenosis of 40-59%. Distal left ICA stenosis of 40-59%. Distal left CCA stenosis of >50% which extends into the ECA.  Increased distal ICA stenosis bilaterally compared to previous study. Left distal CCA disease.   Myoview 07/2014  Low risk stress nuclear study .  There is evidence of a previous anteriorlateral MI that is old. No  ischemia LV Ejection Fraction: 29%. LV Wall Motion: akinesis / severe hypokinesis of the anterior lateral wall.  Echo 7/13 - EF 25% with akinesis of theinferior,inferoseptal, distal anterior and apical walls; hypookinesis elsewhere. . - Aortic valve: Mild regurgitation. - Mitral valve: Mild regurgitation.  Assessment/Plan:  Coronary artery disease involving native coronary artery of native heart without angina pectoris No angina. Would manage medically and conservatively. I have left him on his current regimen. He is going to continue to use some occasional chewing tobacco. He has actually done pretty well for the past 17 years in my opinion.   Ischemic cardiomyopathy He is not a candidate for ACE inhibitor given prior history of hyperkalemia but otherwise on a pretty fair regimen. His breathing may be getting worse - seems to be getting less active. Will get his echo updated. Checking labs today.   Chronic systolic congestive heart failure He is NYHA 3 now - labs today. He has had heart failure for 17 years. Would hope to continue with supportive/conservative care. Will get his echo updated as well.   Hyperlipemia Continue statin. Needs labs today  Automatic implantable cardioverter-defibrillator in situ Follow up with EP as planned. Worry about this admission from March for worsening encephalopathy - culprit seemed to be infected knee - but would be concerned given that he has an implanted device. Fortunately, no recurrence.   Carotid stenosis, bilateral  S/p arch and right carotid angiogram with right carotid angioplasty and stenting - he is on chronic Plavix. Seen at [facility] last month with stable findings. We will follow his dopplers study going forward - needs repeat in April of 2019.   CKD - labs today  Current medicines are reviewed with the patient today.  The patient does not have concerns regarding medicines other than what has been noted above.  The following  changes have been made:  See above.  Labs/ tests ordered today include:    Orders Placed This Encounter  Procedures  . Basic metabolic panel  . CBC  . Hepatic function panel  . TSH  . Lipid panel  . Pro b natriuretic peptide (BNP)  . ECHOCARDIOGRAM COMPLETE     Disposition:   FU with me in 3 months.   Patient is agreeable to this plan and will call if any problems develop in the interim.   SignedNorma Fredrickson, NP  03/15/2017 11:35 AM  [facility] [address] [place], [place]  [address] Phone: [phone] Fax: [phone]

## 2017-03-16 LAB — BASIC METABOLIC PANEL
BUN/Creatinine Ratio: 15 (ref 9–20)
BUN: 18 mg/dL (ref 6–24)
CO2: 23 mmol/L (ref 18–29)
Calcium: 9.5 mg/dL (ref 8.7–10.2)
Chloride: 104 mmol/L (ref 96–106)
Creatinine, Ser: 1.19 mg/dL (ref 0.76–1.27)
GFR calc Af Amer: 77 mL/min/{1.73_m2} (ref >59)
GFR calc non Af Amer: 66 mL/min/{1.73_m2} (ref >59)
Glucose: 90 mg/dL (ref 65–99)
Potassium: 5 mmol/L (ref 3.5–5.2)
Sodium: 145 mmol/L — ABNORMAL HIGH (ref 134–144)

## 2017-03-16 LAB — CBC
Hematocrit: 44.6 % (ref 37.5–51.0)
Hemoglobin: 14.5 g/dL (ref 13.0–17.7)
MCH: 32.6 pg (ref 26.6–33.0)
MCHC: 32.5 g/dL (ref 31.5–35.7)
MCV: 100 fL — ABNORMAL HIGH (ref 79–97)
Platelets: 443 10*3/uL — ABNORMAL HIGH (ref 150–379)
RBC: 4.45 x10E6/uL (ref 4.14–5.80)
RDW: 16.3 % — ABNORMAL HIGH (ref 12.3–15.4)
WBC: 7.7 10*3/uL (ref 3.4–10.8)

## 2017-03-16 LAB — LIPID PANEL
Chol/HDL Ratio: 3.4 ratio (ref 0.0–5.0)
Cholesterol, Total: 146 mg/dL (ref 100–199)
HDL: 43 mg/dL (ref >39)
LDL Calculated: 72 mg/dL (ref 0–99)
Triglycerides: 155 mg/dL — ABNORMAL HIGH (ref 0–149)
VLDL Cholesterol Cal: 31 mg/dL (ref 5–40)

## 2017-03-16 LAB — HEPATIC FUNCTION PANEL
ALT: 9 IU/L (ref 0–44)
AST: 12 IU/L (ref 0–40)
Albumin: 4.2 g/dL (ref 3.5–5.5)
Alkaline Phosphatase: 88 IU/L (ref 39–117)
Bilirubin Total: 0.2 mg/dL (ref 0.0–1.2)
Bilirubin, Direct: 0.07 mg/dL (ref 0.00–0.40)
Total Protein: 6.8 g/dL (ref 6.0–8.5)

## 2017-03-16 LAB — TSH: TSH: 1.2 u[IU]/mL (ref 0.450–4.500)

## 2017-03-16 LAB — PRO B NATRIURETIC PEPTIDE: NT-Pro BNP: 1769 pg/mL — ABNORMAL HIGH (ref 0–210)

## 2017-03-22 ENCOUNTER — Ambulatory Visit (INDEPENDENT_AMBULATORY_CARE_PROVIDER_SITE_OTHER): Payer: Medicare Other | Admitting: Internal Medicine

## 2017-03-22 VITALS — BP 94/58 | HR 80 | Ht 64.0 in | Wt 138.0 lb

## 2017-03-22 DIAGNOSIS — Z95810 Presence of automatic (implantable) cardiac defibrillator: Secondary | ICD-10-CM

## 2017-03-22 DIAGNOSIS — I4729 Other ventricular tachycardia: Secondary | ICD-10-CM

## 2017-03-22 DIAGNOSIS — I472 Ventricular tachycardia: Secondary | ICD-10-CM | POA: Diagnosis not present

## 2017-03-22 DIAGNOSIS — I255 Ischemic cardiomyopathy: Secondary | ICD-10-CM

## 2017-03-22 NOTE — Progress Notes (Signed)
kf Patient Care Team: [name], NP as PCP - General (Nurse Practitioner) [name], NP as Nurse Practitioner (Nurse Practitioner)   HPI  Adam Gillespie is a 61 y.o. male Seen in followup for an ICD implanted 2006 the setting of ischemic heart disease and prior bypass surgery x5 in 2001  His device was changed out 10/15 with the insertion of a new ICD lead.  He has been seen intercurrently by  LG/SW. He has undergone carotid stenting. T  He was hospitalized 3/18 with acute encephalopathy and leukocytosis attributed to an infection. Blood cultures were negative. There is been no interval infection or fever  He has been sleepy  BP has been low   No sob  Records indicate nuclear study in  Demonstrated 10/15  Low risk stress nuclear study . There is evidence of a previous anteriorlateral MI that is old. .LV Ejection Fraction: 29%. LV Wall Motion: akinesis / severe hypokinesis of the anterior lateral wall.  Echocardiogram 04/2012: EF 25%, inferior, inferoseptal, distal anterior and apical HK, mild AI, mild MR.             Past Medical History:  Diagnosis Date  . 9150 lead 05/01/2014  . ACE inhibitor intolerance    Renal insufficiency/hyperkalemia  . Anoxic encephalopathy (HCC)    POSTOP FROM 2001  . Automatic implantable cardiac defibrillator -Medtronic 04/23/2011   a. 07/2014 gen change -  Medtronic EVERA single chamber ICD, serial number  [id number] H.  . Carotid stenosis, right    60-79% right, 40 - 59% left - needs dopplers in June 2014  . CHF (congestive heart failure) (HCC)   . Coronary artery disease   . Gout   . Hearing deficit    Bilateral ears  . Heart murmur   . Hyperlipidemia   . Hypothyroidism   . Ischemic cardiomyopathy    Remote MI with CABG x 5 in 2001; Myocardial perfusion imaging EF 26% old infarct anterior wall. 04/2010  . LV dysfunction    EF is 25% per echo in July 2013  . 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 Sleep apnea  11-2014    Past Surgical History:  Procedure Laterality Date  . ADENOIDECTOMY    . BALLOON DILATION N/A 07/11/2013   Procedure: BALLOON DILATION;  Surgeon: Shirley Friar, MD;  Location: [facility] ENDOSCOPY;  Service: Endoscopy;  Laterality: N/A;  . CARDIAC CATHETERIZATION  08/05/2000   THERE IS ANTERIOR AND APICAL AKINESIS. EF IS ESTIMATED 20%, WITH INFEROBASILAR PORTIONS CONTRACTING REASONABLY WELL.  Marland Kitchen CARDIAC DEFIBRILLATOR PLACEMENT    . COLONOSCOPY WITH PROPOFOL N/A 01/09/2013   Procedure: COLONOSCOPY WITH PROPOFOL;  Surgeon: Shirley Friar, MD;  Location: [facility] ENDOSCOPY;  Service: Endoscopy;  Laterality: N/A;  . CORONARY ARTERY BYPASS GRAFT     x5. lima to the lad, saphenous vein graft to the intermediate and obtuse mariginal, and a saphenous vein graft to the acute mariginal and distal right coronary artery  . ESOPHAGEAL DILATION  2015  . ESOPHAGOGASTRODUODENOSCOPY N/A 07/11/2013   Procedure: ESOPHAGOGASTRODUODENOSCOPY (EGD);  Surgeon: Shirley Friar, MD;  Location: [facility] ENDOSCOPY;  Service: Endoscopy;  Laterality: N/A;  . IMPLANTABLE CARDIOVERTER DEFIBRILLATOR (ICD) GENERATOR CHANGE N/A 07/31/2014   Procedure: ICD GENERATOR CHANGE;  Surgeon: Duke Salvia, MD;  Location: [facility] CATH LAB;  Service: Cardiovascular;  Laterality: N/A;  . LEAD REVISION N/A 07/31/2014   Procedure: LEAD REVISION;  Surgeon: Duke Salvia, MD;  Location: [facility] CATH LAB;  Service: Cardiovascular;  Laterality: N/A;  . PERIPHERAL VASCULAR CATHETERIZATION N/A 05/21/2015   Procedure: Carotid PTA/Stent Intervention;  Surgeon: Sherren Kerns, MD;  Location: [facility] INVASIVE CV LAB;  Service: Cardiovascular;  Laterality: N/A;  . SAVORY DILATION N/A 07/11/2013   Procedure: SAVORY DILATION;  Surgeon: Shirley Friar, MD;  Location: [facility] ENDOSCOPY;  Service: Endoscopy;  Laterality: N/A;  . TONSILLECTOMY      Current Outpatient Prescriptions  Medication Sig Dispense Refill  . allopurinol (ZYLOPRIM) 100 MG tablet TAKE 1  TABLET BY MOUTH  DAILY 90 tablet 3  . aspirin EC 81 MG tablet Take 81 mg by mouth daily.    . clopidogrel (PLAVIX) 75 MG tablet TAKE 1 TABLET BY MOUTH  DAILY 90 tablet 3  . famotidine (PEPCID) 20 MG tablet TAKE 1 TABLET BY MOUTH  DAILY 90 tablet 3  . furosemide (LASIX) 20 MG tablet TAKE 1 TABLET BY MOUTH 3  TIMES WEEKLY ON TUESDAY,  THURSDAY, AND SATURDAY 39 tablet 9  . hydrALAZINE (APRESOLINE) 25 MG tablet TAKE ONE-HALF TABLET BY  MOUTH TWO TIMES DAILY 90 tablet 3  . isosorbide mononitrate (IMDUR) 30 MG 24 hr tablet Take 0.5 tablets (15 mg total) by mouth daily. 90 tablet 3  . levothyroxine (SYNTHROID, LEVOTHROID) 125 MCG tablet TAKE 1 TABLET BY MOUTH  DAILY 90 tablet 3  . metoprolol succinate (TOPROL-XL) 25 MG 24 hr tablet TAKE 1 TABLET BY MOUTH  DAILY 90 tablet 3  . oxybutynin (DITROPAN-XL) 10 MG 24 hr tablet Take 10 mg by mouth daily.    . simvastatin (ZOCOR) 40 MG tablet TAKE 1 TABLET BY MOUTH AT  BEDTIME 90 tablet 3  . tamsulosin (FLOMAX) 0.4 MG CAPS capsule Take 0.4 mg by mouth at bedtime.    . traMADol (ULTRAM) 50 MG tablet Take 50 mg by mouth every 6 (six) hours as needed for moderate pain (right knee).     No current facility-administered medications for this visit.     Allergies  Allergen Reactions  . Ace Inhibitors Other (See Comments)    Contributed to hyperkalemia and renal insufficiency in the past (see office note 10/03/2012).      Review of Systems negative except from HPI and PMH  Physical Exam BP (!) 94/58   Pulse 80   Ht 5\' 4"  (1.626 m)   Wt 138 lb (62.6 kg)   SpO2 97%   BMI 23.69 kg/m  Well developed and nourished in no acute distress HENT normal Neck supple with JVP-flat Carotids brisk and full without bruits Clear Regular rate and rhythm, 2/6 M Abd-soft with active BS without hepatomegaly No Clubbing cyanosis edema Skin-warm and dry A & Oriented  Grossly normal sensory and motor function  ECG demonstrates sinus rhythm at 68  with left bundle branch  block Interval 6:15/17/42   Assessment and  Plan  Ischemic cardiomyopathy  VT NS  Ace Intolerance--  On hydralazine nitrates  implantable defibrillator-Medtronic   Hypotension   Stable. Without symptoms of ischemia  Euvolemic continue current meds  With his hypotension and somnolence, we will discontinue his hydralazine nitrates at low doses.

## 2017-03-22 NOTE — Patient Instructions (Addendum)
Medication Instructions: Your physician has recommended you make the following change in your medication:  -1) STOP Hydralazine (Apresoline) -2) STOP Isosorbide (Imdur)   Labwork: None Ordered  Procedures/Testing: None Ordered  Follow-Up: Your physician wants you to keep scheduled follow up in August with [name], NP and 1 YEAR with [name]. You will receive a reminder letter in the mail two months in advance. If you don't receive a letter, please call our office to schedule the follow-up appointment.   Remote monitoring is used to monitor your ICD from home. This monitoring reduces the number of office visits required to check your device to one time per year. It allows [place] to keep an eye on the functioning of your device to ensure it is working properly. You are scheduled for a device check from home on 06/21/17. You may send your transmission at any time that day. If you have a wireless device, the transmission will be sent automatically. After your physician reviews your transmission, you will receive a postcard with your next transmission date.    Any Additional Special Instructions Will Be Listed Below (If Applicable).     If you need a refill on your cardiac medications before your next appointment, please call your pharmacy.

## 2017-03-30 ENCOUNTER — Other Ambulatory Visit: Payer: Self-pay

## 2017-03-30 ENCOUNTER — Ambulatory Visit (HOSPITAL_COMMUNITY): Payer: Medicare Other | Attending: Cardiology

## 2017-03-30 DIAGNOSIS — I255 Ischemic cardiomyopathy: Secondary | ICD-10-CM | POA: Diagnosis not present

## 2017-03-30 DIAGNOSIS — I5030 Unspecified diastolic (congestive) heart failure: Secondary | ICD-10-CM | POA: Insufficient documentation

## 2017-03-30 DIAGNOSIS — I071 Rheumatic tricuspid insufficiency: Secondary | ICD-10-CM | POA: Diagnosis not present

## 2017-03-30 MED ORDER — PERFLUTREN LIPID MICROSPHERE
1.0000 mL | INTRAVENOUS | Status: AC | PRN
  Administered 2017-03-30: 2 mL via INTRAVENOUS

## 2017-06-07 NOTE — Progress Notes (Signed)
CARDIOLOGY OFFICE NOTE  Date:  06/08/2017    Adam Gillespie Date of Birth: 05-18-1956 Medical Record #[id number]  PCP:  [name], NP  Cardiologist:  Margarito Courser  Chief Complaint  Patient presents with  . Cardiomyopathy    Follow up visit -seen for [name]    History of Present Illness: Adam Gillespie is a 61 y.o. male who presents today for a follow up visit. Seen for [name].   He has a hx of CAD, status post CABG in 2001, ischemic cardiomyopathy with an EF of 25%, status post AICD, hypothyroidism, HL, GERD, CKD, gout, hypothyroidism. 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 He has a tendency towards hyperkalemia and is not on an ACE inhibitor. Myoview 07/2014 low risk. His last echo was in 2013 - EF of 25%.   Seen back in March of 2016 by [name], PA - had progressive carotid disease on the right and was referred [facility]. Now s/p stenting of the right carotid. He is on aspirin and Brilinta. Complicated by hypotension. Most of his cardiac medicines were stopped but we have been able to get him titrated back on up.   Seen several times last year in 2017 and he was doing ok. Mom had been placed in SNF and subsequently died. Brother had passed. Niece is his POA and helps in his care.  He was admitted back in March of 2018 with acute metabolic encephalopathy - unclear etiology but then sounds like he was treated for presumed right knee infection. This has not come back. Blood cultures negative. Not able to have MRI. Negative CT of the head noted.   I then saw him back in May - he was doing ok - seemed to be back to his baseline - but mobility was an issue. He still lives alone. Speech seemed more slurred and difficult to understand. Sleeps most of the day. Their goal has been comfort care with conservative measures but he is not a DNR. He does chew some occasional tobacco.   Saw [name] back in June - BP was low - meds were adjusted  down.   Comes back today. He is here with his niece who is his power of attorney. She is very pleased with how he is doing and this in turn really helps her. Not sleeping as much as he was. He is more active. Actually getting outside and working with his flowers. BP fair - will go up and down. He says he does get dizzy if he is outside - but he is not drinking enough water. This is chronic. No real concerns.   Past Medical History:  Diagnosis Date  . 3953 lead 05/01/2014  . ACE inhibitor intolerance    Renal insufficiency/hyperkalemia  . Anoxic encephalopathy (HCC)    POSTOP FROM 2001  . Automatic implantable cardiac defibrillator -Medtronic 04/23/2011   a. 07/2014 gen change -  Medtronic EVERA single chamber ICD, serial number  [id number].  . Carotid stenosis, right    60-79% right, 40 - 59% left - needs dopplers in June 2014  . CHF (congestive heart failure) (HCC)   . Coronary artery disease   . Gout   . Hearing deficit    Bilateral ears  . Heart murmur   . Hyperlipidemia   . Hypothyroidism   . Ischemic cardiomyopathy    Remote MI with CABG x 5 in 2001; Myocardial perfusion imaging EF 26% old infarct anterior  wall. 04/2010  . LV dysfunction    EF is 25% per echo in July 2013  . 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 Sleep apnea 11-2014    Past Surgical History:  Procedure Laterality Date  . ADENOIDECTOMY    . BALLOON DILATION N/A 07/11/2013   Procedure: BALLOON DILATION;  Surgeon: Shirley Friar, MD;  Location: [facility] ENDOSCOPY;  Service: Endoscopy;  Laterality: N/A;  . CARDIAC CATHETERIZATION  08/05/2000   THERE IS ANTERIOR AND APICAL AKINESIS. EF IS ESTIMATED 20%, WITH INFEROBASILAR PORTIONS CONTRACTING REASONABLY WELL.  Marland Kitchen CARDIAC DEFIBRILLATOR PLACEMENT    . COLONOSCOPY WITH PROPOFOL N/A 01/09/2013   Procedure: COLONOSCOPY WITH PROPOFOL;  Surgeon: Shirley Friar, MD;  Location: [facility] ENDOSCOPY;  Service: Endoscopy;  Laterality: N/A;  . CORONARY ARTERY BYPASS  GRAFT     x5. lima to the lad, saphenous vein graft to the intermediate and obtuse mariginal, and a saphenous vein graft to the acute mariginal and distal right coronary artery  . ESOPHAGEAL DILATION  2015  . ESOPHAGOGASTRODUODENOSCOPY N/A 07/11/2013   Procedure: ESOPHAGOGASTRODUODENOSCOPY (EGD);  Surgeon: Shirley Friar, MD;  Location: [facility] ENDOSCOPY;  Service: Endoscopy;  Laterality: N/A;  . IMPLANTABLE CARDIOVERTER DEFIBRILLATOR (ICD) GENERATOR CHANGE N/A 07/31/2014   Procedure: ICD GENERATOR CHANGE;  Surgeon: Duke Salvia, MD;  Location: [facility] CATH LAB;  Service: Cardiovascular;  Laterality: N/A;  . LEAD REVISION N/A 07/31/2014   Procedure: LEAD REVISION;  Surgeon: Duke Salvia, MD;  Location: [facility] CATH LAB;  Service: Cardiovascular;  Laterality: N/A;  . PERIPHERAL VASCULAR CATHETERIZATION N/A 05/21/2015   Procedure: Carotid PTA/Stent Intervention;  Surgeon: Sherren Kerns, MD;  Location: [facility] INVASIVE CV LAB;  Service: Cardiovascular;  Laterality: N/A;  . SAVORY DILATION N/A 07/11/2013   Procedure: SAVORY DILATION;  Surgeon: Shirley Friar, MD;  Location: [facility] ENDOSCOPY;  Service: Endoscopy;  Laterality: N/A;  . TONSILLECTOMY       Medications: Current Meds  Medication Sig  . allopurinol (ZYLOPRIM) 100 MG tablet TAKE 1 TABLET BY MOUTH  DAILY  . aspirin EC 81 MG tablet Take 81 mg by mouth daily.  . clopidogrel (PLAVIX) 75 MG tablet TAKE 1 TABLET BY MOUTH  DAILY  . famotidine (PEPCID) 20 MG tablet TAKE 1 TABLET BY MOUTH  DAILY  . furosemide (LASIX) 20 MG tablet TAKE 1 TABLET BY MOUTH 3  TIMES WEEKLY ON TUESDAY,  THURSDAY, AND SATURDAY  . levothyroxine (SYNTHROID, LEVOTHROID) 125 MCG tablet TAKE 1 TABLET BY MOUTH  DAILY  . metoprolol succinate (TOPROL-XL) 25 MG 24 hr tablet TAKE 1 TABLET BY MOUTH  DAILY  . oxybutynin (DITROPAN-XL) 10 MG 24 hr tablet Take 10 mg by mouth daily.  . simvastatin (ZOCOR) 40 MG tablet TAKE 1 TABLET BY MOUTH AT  BEDTIME  . tamsulosin (FLOMAX) 0.4 MG CAPS  capsule Take 0.4 mg by mouth at bedtime.  . traMADol (ULTRAM) 50 MG tablet Take 50 mg by mouth every 6 (six) hours as needed for moderate pain (right knee).     Allergies: Allergies  Allergen Reactions  . Ace Inhibitors Other (See Comments)    Contributed to hyperkalemia and renal insufficiency in the past (see office note 10/03/2012).      Social History: The patient  reports that he has never smoked. His smokeless tobacco use includes Chew. He reports that he does not drink alcohol or use drugs.   Family History: The patient's family history includes Asthma in his sister; Cancer in his brother and father;  Emphysema in his father and sister; Heart attack in his maternal grandfather and mother; Stroke in his maternal grandmother.   Review of Systems: Please see the history of present illness.   Otherwise, the review of systems is positive for none.   All other systems are reviewed and negative.   Physical Exam: VS:  BP 100/70 (BP Location: Left Arm, Patient Position: Sitting, Cuff Size: Normal)   Pulse 76   Ht 5\' 4"  (1.626 m)   Wt 135 lb 9.6 oz (61.5 kg)   SpO2 96%   BMI 23.28 kg/m  .  BMI Body mass index is 23.28 kg/m.  Wt Readings from Last 3 Encounters:  06/08/17 135 lb 9.6 oz (61.5 kg)  03/22/17 138 lb (62.6 kg)  03/15/17 137 lb 6.4 oz (62.3 kg)    General: Pleasant. He looks better today. Alert and in no acute distress. He is very hard of hearing.   HEENT: Normal.  Neck: Supple, no JVD, carotid bruits, or masses noted.  Cardiac: Regular rate and rhythm. No murmurs, rubs, or gallops. No edema.  Respiratory:  Lungs are clear to auscultation bilaterally with normal work of breathing.  GI: Soft and nontender.  MS: No deformity or atrophy. Gait and ROM intact.  Skin: Warm and dry. Color is normal.  Neuro:  Strength and sensation are intact and no gross focal deficits noted.  Psych: Alert, appropriate and with normal affect.   LABORATORY DATA:  EKG:  EKG is not  ordered today.  Lab Results  Component Value Date   WBC 7.7 03/15/2017   HGB 14.5 03/15/2017   HCT 44.6 03/15/2017   PLT 443 (H) 03/15/2017   GLUCOSE 90 03/15/2017   CHOL 146 03/15/2017   TRIG 155 (H) 03/15/2017   HDL 43 03/15/2017   LDLDIRECT 87.5 06/03/2014   LDLCALC 72 03/15/2017   ALT 9 03/15/2017   AST 12 03/15/2017   NA 145 (H) 03/15/2017   K 5.0 03/15/2017   CL 104 03/15/2017   CREATININE 1.19 03/15/2017   BUN 18 03/15/2017   CO2 23 03/15/2017   TSH 1.200 03/15/2017   INR 1.10 12/28/2016   HGBA1C 5.8 (H) 02/15/2013     BNP (last 3 results)  Recent Labs  12/28/16 1617  BNP 528.9*    ProBNP (last 3 results)  Recent Labs  03/15/17 1148  PROBNP 1,769*     Other Studies Reviewed Today:  Echo Study Conclusions 03/2017  - Left ventricle: The cavity size was normal. Systolic function was   severely reduced. The estimated ejection fraction was in the   range of 20% to 25%. Diffuse hypokinesis. Doppler parameters are   consistent with abnormal left ventricular relaxation (grade 1   diastolic dysfunction). - Ventricular septum: Septal motion showed abnormal function and   dyssynergy. - Tricuspid valve: There was trivial regurgitation. - Pulmonary arteries: PA peak pressure: 35 mm Hg (S).  Impressions:  - Compared to the prior study, there has been no significant   interval change.  Carotid duplex 4/2018suggests a patent right ICA stent with no restenosis within the stent.  Distal (post stent) right ICA stenosis of 40-59%. Distal left ICA stenosis of 40-59%. Distal left CCA stenosis of >50% which extends into the ECA.  Increased distal ICA stenosis bilaterally compared to previous study. Left distal CCA disease.   Myoview 07/2014 Low risk stress nuclear study .  There is evidence of a previous anteriorlateral MI that is old. No ischemia LV Ejection Fraction: 29%. LV Wall  Motion: akinesis / severe hypokinesis of the anterior lateral  wall.    Assessment/Plan:  Coronary artery disease involving native coronary artery of native heart without angina pectoris No angina. Would manage medically and conservatively. I have left him on his current regimen. He is going to continue to use some occasional chewing tobacco. He has actually done pretty well for the past 17 years in my opinion.   Ischemic cardiomyopathy He is not a candidate for ACE inhibitor given prior history of hyperkalemia - his other CHF meds have been cut back due to lethargy and hypotension. He seems to be holding his own - would favor conservative management. No further changes made today.   Chronic systolic congestive heart failure He is NYHA 3 now - does fairly well given his situation. He has had heart failure for over 17 years. Would hope to continue with supportive/conservative care.   Hyperlipemia Continue statin.   Automatic implantable cardioverter-defibrillator in situ Follow up with EP as planned. Still worry about this admission from March for worsening encephalopathy - culprit seemed to be infected knee - but would be concerned given that he has an implanted device. Fortunately, no recurrence.   Carotid stenosis, bilateral S/p arch and right carotid angiogram with right carotid angioplasty and stenting - he is on chronic Plavix. [facility]en at [facility] with stable findings. We will follow his dopplers study going forward - needs repeat in April of 2019.   CKD- labs on return  Current medicines are reviewed with the patient today.  The patient does not have concerns regarding medicines other than what has been noted above.  The following changes have been made:  See above.  Labs/ tests ordered today include:   No orders of the defined types were placed in this encounter.    Disposition:   FU with me in 4 months with lab on return.  Patient is agreeable to this plan and will call if any problems develop in the interim.    SignedNorma Fredrickson, NP  06/08/2017 10:59 AM  [facility] [address] [place], [place]  [address] Phone: [phone] Fax: [phone]

## 2017-06-08 ENCOUNTER — Encounter: Payer: Self-pay | Admitting: Nurse Practitioner

## 2017-06-08 ENCOUNTER — Ambulatory Visit (INDEPENDENT_AMBULATORY_CARE_PROVIDER_SITE_OTHER): Payer: Medicare Other | Admitting: Nurse Practitioner

## 2017-06-08 VITALS — BP 100/70 | HR 76 | Ht 64.0 in | Wt 135.6 lb

## 2017-06-08 DIAGNOSIS — I4729 Other ventricular tachycardia: Secondary | ICD-10-CM

## 2017-06-08 DIAGNOSIS — I472 Ventricular tachycardia: Secondary | ICD-10-CM

## 2017-06-08 DIAGNOSIS — I255 Ischemic cardiomyopathy: Secondary | ICD-10-CM | POA: Diagnosis not present

## 2017-06-08 DIAGNOSIS — Z95810 Presence of automatic (implantable) cardiac defibrillator: Secondary | ICD-10-CM

## 2017-06-08 NOTE — Patient Instructions (Addendum)
We will be checking the following labs today - NONE   Medication Instructions:    Continue with your current medicines.     Testing/Procedures To Be Arranged:  N/A  Follow-Up:   See me in 4 months with fasting labs    Other Special Instructions:   N/A    If you need a refill on your cardiac medications before your next appointment, please call your pharmacy.   Call the [facility] office at [phone] if you have any questions, problems or concerns.      

## 2017-06-21 ENCOUNTER — Ambulatory Visit (INDEPENDENT_AMBULATORY_CARE_PROVIDER_SITE_OTHER): Payer: Medicare Other | Admitting: *Deleted

## 2017-06-21 DIAGNOSIS — I255 Ischemic cardiomyopathy: Secondary | ICD-10-CM | POA: Diagnosis not present

## 2017-06-22 NOTE — Progress Notes (Signed)
Remote ICD transmission.   

## 2017-06-29 ENCOUNTER — Encounter: Payer: Self-pay | Admitting: Cardiology

## 2017-06-30 LAB — CUP PACEART REMOTE DEVICE CHECK
Battery Voltage: 3 V
Brady Statistic RV Percent Paced: 0.08 %
HighPow Impedance: 65 Ohm
Implantable Lead Location: 753860
Implantable Pulse Generator Implant Date: 20151014
Lead Channel Impedance Value: 437 Ohm
Lead Channel Setting Pacing Amplitude: 2.5 V
Lead Channel Setting Pacing Pulse Width: 0.4 ms
Lead Channel Setting Sensing Sensitivity: 0.3 mV
MDC IDC LEAD IMPLANT DT: 20151014
MDC IDC MSMT BATTERY REMAINING LONGEVITY: 119 mo
MDC IDC MSMT LEADCHNL RV IMPEDANCE VALUE: 342 Ohm
MDC IDC MSMT LEADCHNL RV PACING THRESHOLD AMPLITUDE: 0.875 V
MDC IDC MSMT LEADCHNL RV PACING THRESHOLD PULSEWIDTH: 0.4 ms
MDC IDC MSMT LEADCHNL RV SENSING INTR AMPL: 21.25 mV
MDC IDC MSMT LEADCHNL RV SENSING INTR AMPL: 21.25 mV
MDC IDC SESS DTM: 20180904234225

## 2017-07-13 ENCOUNTER — Other Ambulatory Visit: Payer: Self-pay

## 2017-07-13 DIAGNOSIS — I6529 Occlusion and stenosis of unspecified carotid artery: Secondary | ICD-10-CM

## 2017-07-28 ENCOUNTER — Encounter (HOSPITAL_COMMUNITY): Payer: Medicare Other

## 2017-07-28 ENCOUNTER — Ambulatory Visit: Payer: Medicare Other | Admitting: Family

## 2017-09-20 ENCOUNTER — Ambulatory Visit (INDEPENDENT_AMBULATORY_CARE_PROVIDER_SITE_OTHER): Payer: Medicare Other | Admitting: *Deleted

## 2017-09-20 DIAGNOSIS — I255 Ischemic cardiomyopathy: Secondary | ICD-10-CM

## 2017-09-20 LAB — CUP PACEART REMOTE DEVICE CHECK
Battery Remaining Longevity: 115 mo
Battery Voltage: 2.99 V
Brady Statistic RV Percent Paced: 19.41 %
Date Time Interrogation Session: 20181204083623
HighPow Impedance: 64 Ohm
Implantable Lead Location: 753860
Implantable Pulse Generator Implant Date: 20151014
Lead Channel Impedance Value: 342 Ohm
Lead Channel Pacing Threshold Pulse Width: 0.4 ms
Lead Channel Setting Pacing Amplitude: 2.5 V
Lead Channel Setting Pacing Pulse Width: 0.4 ms
MDC IDC LEAD IMPLANT DT: 20151014
MDC IDC MSMT LEADCHNL RV IMPEDANCE VALUE: 323 Ohm
MDC IDC MSMT LEADCHNL RV PACING THRESHOLD AMPLITUDE: 0.875 V
MDC IDC MSMT LEADCHNL RV SENSING INTR AMPL: 18.75 mV
MDC IDC MSMT LEADCHNL RV SENSING INTR AMPL: 18.75 mV
MDC IDC SET LEADCHNL RV SENSING SENSITIVITY: 0.3 mV

## 2017-09-20 NOTE — Progress Notes (Signed)
Remote ICD transmission.   

## 2017-09-21 ENCOUNTER — Encounter: Payer: Self-pay | Admitting: Cardiology

## 2017-09-27 ENCOUNTER — Ambulatory Visit: Payer: Medicare Other | Admitting: Nurse Practitioner

## 2017-09-30 ENCOUNTER — Telehealth: Payer: Self-pay

## 2017-09-30 NOTE — Telephone Encounter (Signed)
-----   Message from Duke Salvia, MD sent at 09/30/2017  1:08 PM EST ----- Remote reviewed. This remote is abnormal for increased Vpacing percentage and decreased activity Can we get some one to call the famly and see what is going on and arrange for OV if necessary  EL  Well done !!!

## 2017-09-30 NOTE — Telephone Encounter (Signed)
Spoke with Elnita Maxwell and asked if pt has had any decline in health, she stated that no he hasn't, she stated that he has had some back problems but that he is doing ok. Informed Elnita Maxwell that [name] wanted to f/u d/t increase in pacing and decrease in activity level. Elnita Maxwell voiced understanding

## 2017-10-02 NOTE — Telephone Encounter (Signed)
I'm seeing him in about 2 weeks for his routine visit.

## 2017-10-17 ENCOUNTER — Ambulatory Visit (INDEPENDENT_AMBULATORY_CARE_PROVIDER_SITE_OTHER): Payer: Medicare Other | Admitting: *Deleted

## 2017-10-17 ENCOUNTER — Encounter: Payer: Self-pay | Admitting: Nurse Practitioner

## 2017-10-17 ENCOUNTER — Other Ambulatory Visit: Payer: Self-pay | Admitting: Nurse Practitioner

## 2017-10-17 ENCOUNTER — Ambulatory Visit: Payer: Medicare Other | Admitting: Nurse Practitioner

## 2017-10-17 VITALS — BP 138/72 | HR 40 | Ht 64.0 in | Wt 138.4 lb

## 2017-10-17 DIAGNOSIS — Z95810 Presence of automatic (implantable) cardiac defibrillator: Secondary | ICD-10-CM

## 2017-10-17 DIAGNOSIS — E875 Hyperkalemia: Secondary | ICD-10-CM

## 2017-10-17 DIAGNOSIS — I255 Ischemic cardiomyopathy: Secondary | ICD-10-CM

## 2017-10-17 DIAGNOSIS — E7849 Other hyperlipidemia: Secondary | ICD-10-CM

## 2017-10-17 DIAGNOSIS — I5022 Chronic systolic (congestive) heart failure: Secondary | ICD-10-CM | POA: Diagnosis not present

## 2017-10-17 LAB — CUP PACEART INCLINIC DEVICE CHECK
Date Time Interrogation Session: 20181231173702
Implantable Lead Location: 753860
MDC IDC LEAD IMPLANT DT: 20151014
MDC IDC PG IMPLANT DT: 20151014
MDC IDC SET LEADCHNL RV PACING AMPLITUDE: 2.5 V
MDC IDC SET LEADCHNL RV PACING PULSEWIDTH: 0.4 ms
MDC IDC SET LEADCHNL RV SENSING SENSITIVITY: 0.3 mV

## 2017-10-17 LAB — BASIC METABOLIC PANEL
BUN/Creatinine Ratio: 14 (ref 10–24)
BUN: 21 mg/dL (ref 8–27)
CO2: 21 mmol/L (ref 20–29)
Calcium: 9.7 mg/dL (ref 8.6–10.2)
Chloride: 105 mmol/L (ref 96–106)
Creatinine, Ser: 1.47 mg/dL — ABNORMAL HIGH (ref 0.76–1.27)
GFR calc Af Amer: 59 mL/min/{1.73_m2} — ABNORMAL LOW (ref >59)
GFR calc non Af Amer: 51 mL/min/{1.73_m2} — ABNORMAL LOW (ref >59)
Glucose: 83 mg/dL (ref 65–99)
Potassium: 5.7 mmol/L — ABNORMAL HIGH (ref 3.5–5.2)
Sodium: 140 mmol/L (ref 134–144)

## 2017-10-17 LAB — PROTIME-INR
INR: 1 (ref 0.8–1.2)
Prothrombin Time: 10.8 s (ref 9.1–12.0)

## 2017-10-17 LAB — LIPID PANEL
Chol/HDL Ratio: 3 ratio (ref 0.0–5.0)
Cholesterol, Total: 124 mg/dL (ref 100–199)
HDL: 42 mg/dL (ref >39)
LDL Calculated: 57 mg/dL (ref 0–99)
Triglycerides: 123 mg/dL (ref 0–149)
VLDL Cholesterol Cal: 25 mg/dL (ref 5–40)

## 2017-10-17 LAB — CBC
Hematocrit: 44.5 % (ref 37.5–51.0)
Hemoglobin: 14.6 g/dL (ref 13.0–17.7)
MCH: 32.7 pg (ref 26.6–33.0)
MCHC: 32.8 g/dL (ref 31.5–35.7)
MCV: 100 fL — ABNORMAL HIGH (ref 79–97)
Platelets: 394 10*3/uL — ABNORMAL HIGH (ref 150–379)
RBC: 4.47 x10E6/uL (ref 4.14–5.80)
RDW: 16.4 % — ABNORMAL HIGH (ref 12.3–15.4)
WBC: 9.5 10*3/uL (ref 3.4–10.8)

## 2017-10-17 LAB — HEPATIC FUNCTION PANEL
ALT: 14 IU/L (ref 0–44)
AST: 12 IU/L (ref 0–40)
Albumin: 4.2 g/dL (ref 3.6–4.8)
Alkaline Phosphatase: 83 IU/L (ref 39–117)
Bilirubin Total: 0.4 mg/dL (ref 0.0–1.2)
Bilirubin, Direct: 0.14 mg/dL (ref 0.00–0.40)
Total Protein: 6.3 g/dL (ref 6.0–8.5)

## 2017-10-17 LAB — APTT: aPTT: 32 s (ref 24–33)

## 2017-10-17 LAB — TSH: TSH: 1.25 u[IU]/mL (ref 0.450–4.500)

## 2017-10-17 MED ORDER — METOPROLOL SUCCINATE ER 25 MG PO TB24: 25.0000 mg | ORAL_TABLET | Freq: Every day | ORAL | 3 refills | Status: DC

## 2017-10-17 MED ORDER — FUROSEMIDE 20 MG PO TABS: ORAL_TABLET | ORAL | 3 refills | Status: DC

## 2017-10-17 MED ORDER — LEVOTHYROXINE SODIUM 125 MCG PO TABS: 125.0000 ug | ORAL_TABLET | Freq: Every day | ORAL | 3 refills | Status: DC

## 2017-10-17 MED ORDER — SIMVASTATIN 40 MG PO TABS: 40.0000 mg | ORAL_TABLET | Freq: Every day | ORAL | 3 refills | Status: DC

## 2017-10-17 MED ORDER — CLOPIDOGREL BISULFATE 75 MG PO TABS: 75.0000 mg | ORAL_TABLET | Freq: Every day | ORAL | 3 refills | Status: DC

## 2017-10-17 NOTE — Progress Notes (Signed)
CARDIOLOGY OFFICE NOTE  Date:  10/17/2017    Adam Gillespie Date of Birth: [birth year] Medical Record #[id number]  PCP:  [name], NP  Cardiologist:  Margarito Courser    Chief Complaint  Patient presents with  . Cardiomyopathy  . Coronary Artery Disease  . Congestive Heart Failure    Follow up visit. Seen for [name]    History of Present Illness: Adam Gillespie is a 61 y.o. male who presents today for a follow up visit. This is a 4 month check. Seen for [name].   He has a hx of CAD, status post CABG in 2001, ischemic cardiomyopathy with an EF of 25%, status post AICD, hypothyroidism, HL, GERD, CKD, gout, hypothyroidism. 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 He has a tendency towards hyperkalemia and is not on an ACE inhibitor. Myoview 07/2014 low risk. His last echo was in 2013 - EF of 25%. Chronic LBBB.   Seen back in March of 2016 by [name], PA - had progressive carotid disease on the right and was referred to [facility]. Now s/p stenting of the right carotid. He is on aspirin and Brilinta. Procedure was complicated by hypotension. Most of his cardiac medicines were stopped but we have since been able to get him titrated back on up.   Seen several times last year in 2017and he was doing ok. Mom hadbeen placed in SNF and subsequently died. Brother hadpassed. Niece is his POA and helps in his care.  He was admitted back in March of 2018 with acute metabolic encephalopathy - unclear etiology but then sounds like he was treated for presumed right knee infection.  Blood cultures negative. Not able to have MRI due to his ICD.  Negative CT of the head noted.   I then saw him back in May - he was doing ok - seemed to be back to his baseline - but mobility was an issue. He still lives alone. Speech seemed more slurred and difficult to understand. Sleeping most of the day. Their goal has been comfort care with conservative measures but he is  not a DNR. He does chew some occasional tobacco.   Saw [name] back in June - BP was low - meds were adjusted down. Echo updated and EF remains at 20%.  I last saw him back in August. He was doing more - actually getting outside and working with his flowers.   Device check earlier this month with increased frequency of V pacing and decreased activity level noted. No real change reported with [place] according to the family.   Comes back today. He is here with his niece who is his power of attorney. He is now using a walker. Not as active with the weather getting cold. He is not dizzy. No passing out. Family notes he is not as active which is not unusual for him. No chest pain. No swelling.   Past Medical History:  Diagnosis Date  . 4098 lead 05/01/2014  . ACE inhibitor intolerance    Renal insufficiency/hyperkalemia  . Anoxic encephalopathy (HCC)    POSTOP FROM 2001  . Automatic implantable cardiac defibrillator -Medtronic 04/23/2011   a. 07/2014 gen change -  Medtronic EVERA single chamber ICD, serial number  [id number].  . Carotid stenosis, right    60-79% right, 40 - 59% left - needs dopplers in June 2014  . CHF (congestive heart failure) (HCC)   . Coronary artery disease   .  Gout   . Hearing deficit    Bilateral ears  . Heart murmur   . Hyperlipidemia   . Hypothyroidism   . Ischemic cardiomyopathy    Remote MI with CABG x 5 in 2001; Myocardial perfusion imaging EF 26% old infarct anterior wall. 04/2010  . LV dysfunction    EF is 25% per echo in July 2013  . 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 Sleep apnea 11-2014    Past Surgical History:  Procedure Laterality Date  . ADENOIDECTOMY    . BALLOON DILATION N/A 07/11/2013   Procedure: BALLOON DILATION;  Surgeon: Shirley FriarVincent C. Schooler, MD;  [facility];  Service: Endoscopy;  Laterality: N/A;  . CARDIAC CATHETERIZATION  08/05/2000   THERE IS ANTERIOR AND APICAL AKINESIS. EF IS ESTIMATED 20%, WITH  INFEROBASILAR PORTIONS CONTRACTING REASONABLY WELL.  Marland Kitchen. CARDIAC DEFIBRILLATOR PLACEMENT    . COLONOSCOPY WITH PROPOFOL N/A 01/09/2013   Procedure: COLONOSCOPY WITH PROPOFOL;  Surgeon: Shirley FriarVincent C. Schooler, MD;  Location: [facility] ENDOSCOPY;  Service: Endoscopy;  Laterality: N/A;  . CORONARY ARTERY BYPASS GRAFT     x5. lima to the lad, saphenous vein graft to the intermediate and obtuse mariginal, and a saphenous vein graft to the acute mariginal and distal right coronary artery  . ESOPHAGEAL DILATION  2015  . ESOPHAGOGASTRODUODENOSCOPY N/A 07/11/2013   Procedure: ESOPHAGOGASTRODUODENOSCOPY (EGD);  Surgeon: Shirley FriarVincent C. Schooler, MD;  [facility];  Service: Endoscopy;  Laterality: N/A;  . IMPLANTABLE CARDIOVERTER DEFIBRILLATOR (ICD) GENERATOR CHANGE N/A 07/31/2014   Procedure: ICD GENERATOR CHANGE;  Surgeon: Duke SalviaSteven C Klein, MD;  [facility];  Service: Cardiovascular;  Laterality: N/A;  . LEAD REVISION N/A 07/31/2014   Procedure: LEAD REVISION;  Surgeon: Duke SalviaSteven C Klein, MD;  [facility];  Service: Cardiovascular;  Laterality: N/A;  . PERIPHERAL VASCULAR CATHETERIZATION N/A 05/21/2015   Procedure: Carotid PTA/Stent Intervention;  Surgeon: Sherren Kernsharles E Fields, MD;  Location: [facility] INVASIVE CV LAB;  Service: Cardiovascular;  Laterality: N/A;  . SAVORY DILATION N/A 07/11/2013   Procedure: SAVORY DILATION;  Surgeon: Shirley FriarVincent C. Schooler, MD;  [facility];  Service: Endoscopy;  Laterality: N/A;  . TONSILLECTOMY       Medications: Current Meds  Medication Sig  . allopurinol (ZYLOPRIM) 100 MG tablet TAKE 1 TABLET BY MOUTH  DAILY  . aspirin EC 81 MG tablet Take 81 mg by mouth daily.  . clopidogrel (PLAVIX) 75 MG tablet Take 1 tablet (75 mg total) by mouth daily.  . famotidine (PEPCID) 20 MG tablet TAKE 1 TABLET BY MOUTH  DAILY  . furosemide (LASIX) 20 MG tablet TAKE 1 TABLET BY MOUTH 3  TIMES WEEKLY ON TUESDAY,  THURSDAY, AND SATURDAY  . levothyroxine (SYNTHROID, LEVOTHROID) 125  MCG tablet Take 1 tablet (125 mcg total) by mouth daily.  . metoprolol succinate (TOPROL-XL) 25 MG 24 hr tablet Take 1 tablet (25 mg total) by mouth daily.  Marland Kitchen. oxybutynin (DITROPAN-XL) 10 MG 24 hr tablet Take 10 mg by mouth daily.  . simvastatin (ZOCOR) 40 MG tablet Take 1 tablet (40 mg total) by mouth at bedtime.  . tamsulosin (FLOMAX) 0.4 MG CAPS capsule Take 0.4 mg by mouth at bedtime.  . traMADol (ULTRAM) 50 MG tablet Take 50 mg by mouth every 6 (six) hours as needed for moderate pain (right knee).  . [DISCONTINUED] clopidogrel (PLAVIX) 75 MG tablet TAKE 1 TABLET BY MOUTH  DAILY  . [DISCONTINUED] furosemide (LASIX) 20 MG tablet TAKE 1 TABLET BY MOUTH 3  TIMES WEEKLY ON TUESDAY,  THURSDAY, AND SATURDAY  . [DISCONTINUED] levothyroxine (SYNTHROID, LEVOTHROID) 125 MCG tablet TAKE 1 TABLET BY MOUTH  DAILY  . [DISCONTINUED] metoprolol succinate (TOPROL-XL) 25 MG 24 hr tablet TAKE 1 TABLET BY MOUTH  DAILY  . [DISCONTINUED] simvastatin (ZOCOR) 40 MG tablet TAKE 1 TABLET BY MOUTH AT  BEDTIME     Allergies: Allergies  Allergen Reactions  . Ace Inhibitors Other (See Comments)    Contributed to hyperkalemia and renal insufficiency in the past (see office note 10/03/2012).      Social History: The patient  reports that  has never smoked. His smokeless tobacco use includes chew. He reports that he does not drink alcohol or use drugs.   Family History: The patient's family history includes Asthma in his sister; Cancer in his brother and father; Emphysema in his father and sister; Heart attack in his maternal grandfather and mother; Stroke in his maternal grandmother.   Review of Systems: Please see the history of present illness.   Otherwise, the review of systems is positive for none.   All other systems are reviewed and negative.   Physical Exam: VS:  BP 138/72 (BP Location: Left Arm, Patient Position: Sitting, Cuff Size: Normal)   Pulse (!) 40   Ht 5\' 4"  (1.626 m)   Wt 138 lb 6.4 oz (62.8  kg)   SpO2 94%   BMI 23.76 kg/m  .  BMI Body mass index is 23.76 kg/m.  Wt Readings from Last 3 Encounters:  10/17/17 138 lb 6.4 oz (62.8 kg)  06/08/17 135 lb 9.6 oz (61.5 kg)  03/22/17 138 lb (62.6 kg)    General: Pleasant. He is alert. In no acute distress. Very hard of hearing. Little unkept.   HEENT: Normal.  Neck: Supple, no JVD, carotid bruits, or masses noted.  Cardiac: Regular rate and rhythm. No murmurs, rubs, or gallops. No edema.  Respiratory:  Lungs are clear to auscultation bilaterally with normal work of breathing.  GI: Soft and nontender.  MS: No deformity or atrophy. Gait and ROM intact.  Skin: Warm and dry. Color is normal.  Neuro:  Strength and sensation are intact and no gross focal deficits noted.  Psych: Flat. Encephalopathic still.   LABORATORY DATA:  EKG:  EKG is ordered today. This demonstrates underlying CHB with V pacing - rate of 40.   Lab Results  Component Value Date   WBC 7.7 03/15/2017   HGB 14.5 03/15/2017   HCT 44.6 03/15/2017   PLT 443 (H) 03/15/2017   GLUCOSE 90 03/15/2017   CHOL 146 03/15/2017   TRIG 155 (H) 03/15/2017   HDL 43 03/15/2017   LDLDIRECT 87.5 06/03/2014   LDLCALC 72 03/15/2017   ALT 9 03/15/2017   AST 12 03/15/2017   NA 145 (H) 03/15/2017   K 5.0 03/15/2017   CL 104 03/15/2017   CREATININE 1.19 03/15/2017   BUN 18 03/15/2017   CO2 23 03/15/2017   TSH 1.200 03/15/2017   INR 1.10 12/28/2016   HGBA1C 5.8 (H) 02/15/2013     BNP (last 3 results) Recent Labs    12/28/16 1617  BNP 528.9*    ProBNP (last 3 results) Recent Labs    03/15/17 1148  PROBNP 1,769*     Other Studies Reviewed Today:  Echo Study Conclusions 03/2017  - Left ventricle: The cavity size was normal. Systolic function was severely reduced. The estimated ejection fraction was in the range of 20% to 25%. Diffuse hypokinesis. Doppler parameters are  consistent with abnormal left ventricular relaxation (grade 1 diastolic  dysfunction). - Ventricular septum: Septal motion showed abnormal function and dyssynergy. - Tricuspid valve: There was trivial regurgitation. - Pulmonary arteries: PA peak pressure: 35 mm Hg (S).  Impressions:  - Compared to the prior study, there has been no significant interval change.  Carotid duplex 01/2017 suggests a patent right ICA stent with no restenosis within the stent.  Distal (post stent) right ICA stenosis of 40-59%. Distal left ICA stenosis of 40-59%. Distal left CCA stenosis of >50% which extends into the ECA.  Increased distal ICA stenosis bilaterally compared to previous study. Left distal CCA disease.   Myoview 07/2014 Low risk stress nuclear study .  There is evidence of a previous anteriorlateral MI that is old. No ischemia LV Ejection Fraction: 29%. LV Wall Motion: akinesis / severe hypokinesis of the anterior lateral wall.    Assessment/Plan:  1. Underlying ICD - noted to have more V pacing on last remote check - looks to me to have underlying CHB - now with back up pacing noted - discussed with Dr. Johney Frame (DOD) - currently not symptomatic - will reprogram his device to VVIR with a rate of 60, arrange follow up with [name] to discuss device upgrade later this week. Lab today. He remains totally asymptomatic. Will do all his labs today.   2. Coronary artery disease involving native coronary artery of native heart without angina pectoris No angina. Would continue to manage medically and conservatively. I have left him on his current regimen. He is going to continue to use some occasional chewing tobacco. He still does surprisingly well.   3. Ischemic cardiomyopathy He is not a candidate for ACE inhibitor given prior history of hyperkalemia - his other CHF meds have been cut back due to lethargy and hypotension. He seems to be holding his own - but now in CHB on EKG - see above.   4. Chronic systolic congestive heart failure He is NYHA 3  now - does fairly well given his situation. He has had heart failure for over 17 years. Would hope to continue with supportive/conservative care. Will be needing device upgrade  5. Hyperlipemia Continue statin. Lab today.   6. Carotid stenosis, bilateral S/p arch and right carotid angiogram with right carotid angioplasty and stenting - he is on chronic Plavix. Previously followed by [facility].  We will follow his dopplers study going forward per family request - will need repeat in April of 2019.   7. CKD- labs today  Current medicines are reviewed with the patient today.  The patient does not have concerns regarding medicines other than what has been noted above.  The following changes have been made:  See above.  Labs/ tests ordered today include:    Orders Placed This Encounter  Procedures  . TSH  . Basic metabolic panel  . CBC  . Protime-INR  . APTT  . Lipid panel  . Hepatic function panel  . EKG 12-Lead     Disposition:   FU with me in 4 months. To see [name] later this week.   Patient is agreeable to this plan and will call if any problems develop in the interim.   SignedNorma Fredrickson, NP  10/17/2017 8:51 AM  [facility] [address] [place], [place]  [address] Phone: [phone] Fax: [phone]

## 2017-10-17 NOTE — Progress Notes (Signed)
Add on with [name], NP. Changes made per JA recommendation- change mode to VVIR, increase base rate to 60bpm. Follow up as scheduled.

## 2017-10-17 NOTE — Patient Instructions (Addendum)
We will be checking the following labs today - BMET, CBC, HPF, lipids, TSH, PT and PTT   Medication Instructions:    Continue with your current medicines.   I have refilled your medicines today.     Testing/Procedures To Be Arranged:  N/A  Follow-Up:   See [name] on Thursday to discuss device change  See me in 4 months.    Other Special Instructions:   Call us or report to the ER for any dizzy/passing out spells.     If you need a refill on your cardiac medications before your next appointment, please call your pharmacy.   Call the [facility] office at [phone] if you have any questions, problems or concerns.

## 2017-10-20 ENCOUNTER — Encounter: Payer: Self-pay | Admitting: Internal Medicine

## 2017-10-20 ENCOUNTER — Other Ambulatory Visit: Payer: Medicare Other

## 2017-10-20 ENCOUNTER — Ambulatory Visit: Payer: Medicare Other | Admitting: Internal Medicine

## 2017-10-20 VITALS — BP 126/68 | HR 85 | Ht 64.0 in | Wt 139.0 lb

## 2017-10-20 DIAGNOSIS — E875 Hyperkalemia: Secondary | ICD-10-CM

## 2017-10-20 DIAGNOSIS — I5022 Chronic systolic (congestive) heart failure: Secondary | ICD-10-CM

## 2017-10-20 DIAGNOSIS — Z95810 Presence of automatic (implantable) cardiac defibrillator: Secondary | ICD-10-CM

## 2017-10-20 DIAGNOSIS — I255 Ischemic cardiomyopathy: Secondary | ICD-10-CM

## 2017-10-20 LAB — CUP PACEART INCLINIC DEVICE CHECK
Battery Remaining Longevity: 112 mo
Battery Voltage: 2.98 V
Brady Statistic RV Percent Paced: 99.91 %
Date Time Interrogation Session: 20190103180437
HIGH POWER IMPEDANCE MEASURED VALUE: 64 Ohm
Implantable Lead Location: 753860
Lead Channel Impedance Value: 399 Ohm
Lead Channel Setting Pacing Amplitude: 2.5 V
Lead Channel Setting Pacing Pulse Width: 0.4 ms
Lead Channel Setting Sensing Sensitivity: 0.3 mV
MDC IDC LEAD IMPLANT DT: 20151014
MDC IDC MSMT LEADCHNL RV IMPEDANCE VALUE: 323 Ohm
MDC IDC MSMT LEADCHNL RV PACING THRESHOLD AMPLITUDE: 1 V
MDC IDC MSMT LEADCHNL RV PACING THRESHOLD PULSEWIDTH: 0.4 ms
MDC IDC PG IMPLANT DT: 20151014

## 2017-10-20 NOTE — Progress Notes (Signed)
k[facility] Patient Care Team: [name], NP as PCP - General (Nurse Practitioner) [name], NP as Nurse Practitioner (Nurse Practitioner)   HPI  Adam Gillespie is a 62 y.o. male Seen in followup for an ICD implanted 2006 the setting of ischemic heart disease and prior bypass surgery x5 in 2001  His device was changed out 10/15 with the insertion of a new ICD lead.  He has been seen intercurrently by  LG/SW. He has undergone carotid stenting. T  He was hospitalized 3/18 with acute encephalopathy and leukocytosis attributed to an infection. Blood cultures were negative. There is been no interval infection or fever  Over the recent months, there has been worsening of his balance.  He now walks with a walker.  This is been going on for months and antedates the clear change in activity identified on his defibrillator in late October.  He was seen last week by LG NP and found to be in complete heart block.  No dizziness of syncope and not much less active  He has had increasing fatigue and somnolence.  He is also unable to stand for prolonged periods of time.  He has had hypotension in the past prompting discontinuation of hydralazine and nitrates at his last visit. No chest pain but some shortness of breath    Records indicate nuclear study in  Demonstrated 10/15  Low risk stress nuclear study . There is evidence of a previous anteriorlateral MI that is old. .LV Ejection Fraction: 29%. LV Wall Motion: akinesis / severe hypokinesis of the anterior lateral wall.  Echocardiogram 04/2012: EF 25%, inferior, inferoseptal, distal anterior and apical HK, mild AI, mild MR.   Date Cr K Hgb  12/18 1.47 5.7 14.6                  Past Medical History:  Diagnosis Date  . 0762 lead 05/01/2014  . ACE inhibitor intolerance    Renal insufficiency/hyperkalemia  . Anoxic encephalopathy (HCC)    POSTOP FROM 2001  . Automatic implantable cardiac defibrillator -Medtronic 04/23/2011   a.  07/2014 gen change -  Medtronic EVERA single chamber ICD, serial number  [id number].  . Carotid stenosis, right    60-79% right, 40 - 59% left - needs dopplers in June 2014  . CHF (congestive heart failure) (HCC)   . Coronary artery disease   . Gout   . Hearing deficit    Bilateral ears  . Heart murmur   . Hyperlipidemia   . Hypothyroidism   . Ischemic cardiomyopathy    Remote MI with CABG x 5 in 2001; Myocardial perfusion imaging EF 26% old infarct anterior wall. 04/2010  . LV dysfunction    EF is 25% per echo in July 2013  . 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 Sleep apnea 11-2014    Past Surgical History:  Procedure Laterality Date  . ADENOIDECTOMY    . BALLOON DILATION N/A 07/11/2013   Procedure: BALLOON DILATION;  Surgeon: Shirley Friar, MD;  [facility] ENDOSCOPY;  Service: Endoscopy;  Laterality: N/A;  . CARDIAC CATHETERIZATION  08/05/2000   THERE IS ANTERIOR AND APICAL AKINESIS. EF IS ESTIMATED 20%, WITH INFEROBASILAR PORTIONS CONTRACTING REASONABLY WELL.  Marland Kitchen CARDIAC DEFIBRILLATOR PLACEMENT    . COLONOSCOPY WITH PROPOFOL N/A 01/09/2013   Procedure: COLONOSCOPY WITH PROPOFOL;  Surgeon: Shirley Friar, MD;  Location: [facility] ENDOSCOPY;  Service: Endoscopy;  Laterality: N/A;  . CORONARY ARTERY BYPASS GRAFT     x5.  lima to the lad, saphenous vein graft to the intermediate and obtuse mariginal, and a saphenous vein graft to the acute mariginal and distal right coronary artery  . ESOPHAGEAL DILATION  2015  . ESOPHAGOGASTRODUODENOSCOPY N/A 07/11/2013   Procedure: ESOPHAGOGASTRODUODENOSCOPY (EGD);  Surgeon: Shirley Friar, MD;  [facility] END[facility]COPY;  Service: Endoscopy;  Laterality: N/A;  . IMPLANTABLE CARDIOVERTER DEFIBRILLATOR (ICD) GENERATOR CHANGE N/A 07/31/2014   Procedure: ICD GENERATOR CHANGE;  Surgeon: Duke Salvia, MD;  Location: [facility] CATH LAB;  Service: Cardiovascular;  Laterality: N/A;  . LEAD REVISION N/A 07/31/2014   Procedure: LEAD REVISION;   Surgeon: Duke Salvia, MD;  Location: [facility] CATH LAB;  Service: Cardiovascular;  Laterality: N/A;  . PERIPHERAL VASCULAR CATHETERIZATION N/A 05/21/2015   Procedure: Carotid PTA/Stent Intervention;  Surgeon: Sherren Kerns, MD;  Location: [facility] INVASIVE CV LAB;  Service: Cardiovascular;  Laterality: N/A;  . SAVORY DILATION N/A 07/11/2013   Procedure: SAVORY DILATION;  Surgeon: Shirley Friar, MD;  Location: [facility] ENDOSCOPY;  Service: Endoscopy;  Laterality: N/A;  . TONSILLECTOMY      Current Outpatient Medications  Medication Sig Dispense Refill  . allopurinol (ZYLOPRIM) 100 MG tablet TAKE 1 TABLET BY MOUTH  DAILY 90 tablet 3  . aspirin EC 81 MG tablet Take 81 mg by mouth daily.    . clopidogrel (PLAVIX) 75 MG tablet Take 1 tablet (75 mg total) by mouth daily. 90 tablet 3  . famotidine (PEPCID) 20 MG tablet TAKE 1 TABLET BY MOUTH  DAILY 90 tablet 3  . furosemide (LASIX) 20 MG tablet TAKE 1 TABLET BY MOUTH 3  TIMES WEEKLY ON TUESDAY,  THURSDAY, AND SATURDAY 45 tablet 3  . levothyroxine (SYNTHROID, LEVOTHROID) 125 MCG tablet Take 1 tablet (125 mcg total) by mouth daily. 90 tablet 3  . metoprolol succinate (TOPROL-XL) 25 MG 24 hr tablet Take 1 tablet (25 mg total) by mouth daily. 90 tablet 3  . oxybutynin (DITROPAN-XL) 10 MG 24 hr tablet Take 10 mg by mouth daily.    . simvastatin (ZOCOR) 40 MG tablet Take 1 tablet (40 mg total) by mouth at bedtime. 90 tablet 3  . tamsulosin (FLOMAX) 0.4 MG CAPS capsule Take 0.4 mg by mouth at bedtime.    . traMADol (ULTRAM) 50 MG tablet Take 50 mg by mouth every 6 (six) hours as needed for moderate pain (right knee).     No current facility-administered medications for this visit.     Allergies  Allergen Reactions  . Ace Inhibitors Other (See Comments)    Contributed to hyperkalemia and renal insufficiency in the past (see office note 10/03/2012).      Review of Systems negative except from HPI and PMH  Physical Exam BP 126/68   Pulse 85   Ht 5\' 4"   (1.626 m)   Wt 139 lb (63 kg)   SpO2 92%   BMI 23.86 kg/m  Well developed and nourished in no acute distress HENT normal Neck supple with JVP-flat Clear Regular rate and rhythm, 2/6 m Abd-soft with active BS No Clubbing cyanosis edema Skin-warm and dry A & Oriented  Grossly normal sensory and motor function normal sensory and motor function  ECG demonstrates ventricular pacing at 60 Assessment and  Plan  Ischemic cardiomyopathy  VT NS  Ace Intolerance--  On hydralazine nitrates  Implantable defibrillator-Medtronic- VVI  Hypotension  Complete heart block  Orthostatic lightheadedness    The patient has progressed from left bundle branch block--complete heart block.  Dr. Harlon Ditty intervention last  week has been associated with modest improvement i.e. simply pacing at a lower rate of 60.  The family is disinclined at this point given functional deterioration over the last year or 2 to jump quickly into device upgrade.  I would concur.  I would wait to see how it is that he tolerates ventricular pacing.We have increased his lower rate from 60--70.  His lightheadedness seems to be aggravated by standing.  I suspect he has orthostatic hypotension.  We will check that.  Perhaps an abdominal binder will be of benefit  We will discontinue metoprolol.

## 2017-10-20 NOTE — Patient Instructions (Signed)
Medication Instructions: Your physician has recommended you make the following change in your medication:  -1) STOP Metoprolol Succinate (Toprol XL)  Labwork: None Ordered  Procedures/Testing: None Ordered  Follow-Up: Your physician wants you to follow-up in 3 MONTHS with [name].  Remote monitoring is used to monitor your ICD from home. This monitoring reduces the number of office visits required to check your device to one time per year. It allows [place] to keep an eye on the functioning of your device to ensure it is working properly. You are scheduled for a device check from home on 12/20/17. You may send your transmission at any time that day. If you have a wireless device, the transmission will be sent automatically. After your physician reviews your transmission, you will receive a postcard with your next transmission date.   If you need a refill on your cardiac medications before your next appointment, please call your pharmacy.

## 2017-10-21 LAB — BASIC METABOLIC PANEL
BUN/Creatinine Ratio: 17 (ref 10–24)
BUN: 22 mg/dL (ref 8–27)
CO2: 16 mmol/L — ABNORMAL LOW (ref 20–29)
Calcium: 9 mg/dL (ref 8.6–10.2)
Chloride: 106 mmol/L (ref 96–106)
Creatinine, Ser: 1.31 mg/dL — ABNORMAL HIGH (ref 0.76–1.27)
GFR calc Af Amer: 68 mL/min/{1.73_m2} (ref >59)
GFR calc non Af Amer: 59 mL/min/{1.73_m2} — ABNORMAL LOW (ref >59)
Glucose: 94 mg/dL (ref 65–99)
Potassium: 4.7 mmol/L (ref 3.5–5.2)
Sodium: 140 mmol/L (ref 134–144)

## 2017-12-20 ENCOUNTER — Ambulatory Visit (INDEPENDENT_AMBULATORY_CARE_PROVIDER_SITE_OTHER): Payer: Medicare Other | Admitting: *Deleted

## 2017-12-20 DIAGNOSIS — I255 Ischemic cardiomyopathy: Secondary | ICD-10-CM

## 2017-12-20 LAB — CUP PACEART REMOTE DEVICE CHECK
HIGH POWER IMPEDANCE MEASURED VALUE: 66 Ohm
Implantable Lead Implant Date: 20151014
Lead Channel Impedance Value: 285 Ohm
Lead Channel Pacing Threshold Amplitude: 1 V
Lead Channel Pacing Threshold Pulse Width: 0.4 ms
Lead Channel Sensing Intrinsic Amplitude: 5.5 mV
Lead Channel Sensing Intrinsic Amplitude: 5.5 mV
Lead Channel Setting Pacing Amplitude: 2.5 V
Lead Channel Setting Sensing Sensitivity: 0.3 mV
MDC IDC LEAD LOCATION: 753860
MDC IDC MSMT BATTERY REMAINING LONGEVITY: 106 mo
MDC IDC MSMT BATTERY VOLTAGE: 2.98 V
MDC IDC MSMT LEADCHNL RV IMPEDANCE VALUE: 380 Ohm
MDC IDC PG IMPLANT DT: 20151014
MDC IDC SESS DTM: 20190305083324
MDC IDC SET LEADCHNL RV PACING PULSEWIDTH: 0.4 ms
MDC IDC STAT BRADY RV PERCENT PACED: 99.73 %

## 2017-12-20 NOTE — Progress Notes (Signed)
Remote ICD transmission.   

## 2017-12-21 ENCOUNTER — Encounter: Payer: Self-pay | Admitting: Cardiology

## 2018-01-24 ENCOUNTER — Encounter: Payer: Self-pay | Admitting: Internal Medicine

## 2018-01-25 ENCOUNTER — Ambulatory Visit: Payer: Medicare Other | Admitting: Nurse Practitioner

## 2018-01-30 ENCOUNTER — Encounter: Payer: Self-pay | Admitting: Internal Medicine

## 2018-01-30 ENCOUNTER — Ambulatory Visit: Payer: Medicare Other | Admitting: Internal Medicine

## 2018-01-30 VITALS — BP 120/70 | HR 85 | Ht 64.0 in | Wt 137.0 lb

## 2018-01-30 DIAGNOSIS — I5022 Chronic systolic (congestive) heart failure: Secondary | ICD-10-CM | POA: Diagnosis not present

## 2018-01-30 DIAGNOSIS — Z95810 Presence of automatic (implantable) cardiac defibrillator: Secondary | ICD-10-CM | POA: Diagnosis not present

## 2018-01-30 DIAGNOSIS — I255 Ischemic cardiomyopathy: Secondary | ICD-10-CM

## 2018-01-30 LAB — CUP PACEART INCLINIC DEVICE CHECK
Battery Remaining Longevity: 102 mo
Date Time Interrogation Session: 20190415135418
HIGH POWER IMPEDANCE MEASURED VALUE: 61 Ohm
Implantable Lead Implant Date: 20151014
Implantable Pulse Generator Implant Date: 20151014
Lead Channel Impedance Value: 285 Ohm
Lead Channel Pacing Threshold Amplitude: 1.25 V
Lead Channel Pacing Threshold Pulse Width: 0.4 ms
Lead Channel Sensing Intrinsic Amplitude: 23.5 mV
Lead Channel Setting Pacing Pulse Width: 0.4 ms
MDC IDC LEAD LOCATION: 753860
MDC IDC MSMT BATTERY VOLTAGE: 2.99 V
MDC IDC MSMT LEADCHNL RV IMPEDANCE VALUE: 380 Ohm
MDC IDC MSMT LEADCHNL RV SENSING INTR AMPL: 23.375 mV
MDC IDC SET LEADCHNL RV PACING AMPLITUDE: 2.5 V
MDC IDC SET LEADCHNL RV SENSING SENSITIVITY: 0.3 mV
MDC IDC STAT BRADY RV PERCENT PACED: 99.63 %

## 2018-01-30 NOTE — Progress Notes (Signed)
kf Patient Care Team: [name], NP as PCP - General (Nurse Practitioner) [name], NP as Nurse Practitioner (Nurse Practitioner)   HPI  Adam Gillespie is a 62 y.o. male Seen in followup for an ICD implanted 2006 the setting of ischemic heart disease and prior bypass surgery x5 in 2001  His device was changed out 10/15 with the insertion of a new ICD lead.  He has been seen intercurrently by  LG/SW. He has undergone carotid stenting. T  The Records indicate nuclear study in  Demonstrated 10/15  Low risk stress nuclear study . There is evidence of a previous anteriorlateral MI that is old. .LV Ejection Fraction: 29%. LV Wall Motion: akinesis / severe hypokinesis of the anterior lateral wall.  Echocardiogram 04/2012: EF 25%, inferior, inferoseptal, distal anterior and apical HK, mild AI, mild MR.   Date Cr K Hgb  12/18 1.47 5.7 14.6         He developed complete heart block.  He was seen January 19 for this, it was elected at that point to follow rather than consider device upgrade.  He was addressed previously by increasing his pacing rate and activating rate response (VVIR 60).  Increasing activity since reprogrammed to VVIR.  He is okay with this caregivers okay with this     Past Medical History:  Diagnosis Date  . 1610 lead 05/01/2014  . ACE inhibitor intolerance    Renal insufficiency/hyperkalemia  . Anoxic encephalopathy (HCC)    POSTOP FROM 2001  . Automatic implantable cardiac defibrillator -Medtronic 04/23/2011   a. 07/2014 gen change -  Medtronic EVERA single chamber ICD, serial number  [id number] H.  . Carotid stenosis, right    60-79% right, 40 - 59% left - needs dopplers in June 2014  . CHF (congestive heart failure) (HCC)   . Coronary artery disease   . Gout   . Hearing deficit    Bilateral ears  . Heart murmur   . Hyperlipidemia   . Hypothyroidism   . Ischemic cardiomyopathy    Remote MI with CABG x 5 in 2001; Myocardial perfusion imaging EF  26% old infarct anterior wall. 04/2010  . LV dysfunction    EF is 25% per echo in July 2013  . 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 Sleep apnea 11-2014    Past Surgical History:  Procedure Laterality Date  . ADENOIDECTOMY    . BALLOON DILATION N/A 07/11/2013   Procedure: BALLOON DILATION;  Surgeon: Shirley Friar, MD;  Location: [facility] ENDOSCOPY;  Service: Endoscopy;  Laterality: N/A;  . CARDIAC CATHETERIZATION  08/05/2000   THERE IS ANTERIOR AND APICAL AKINESIS. EF IS ESTIMATED 20%, WITH INFEROBASILAR PORTIONS CONTRACTING REASONABLY WELL.  Marland Kitchen CARDIAC DEFIBRILLATOR PLACEMENT    . COLONOSCOPY WITH PROPOFOL N/A 01/09/2013   Procedure: COLONOSCOPY WITH PROPOFOL;  Surgeon: Shirley Friar, MD;  Location: [facility] ENDOSCOPY;  Service: Endoscopy;  Laterality: N/A;  . CORONARY ARTERY BYPASS GRAFT     x5. lima to the lad, saphenous vein graft to the intermediate and obtuse mariginal, and a saphenous vein graft to the acute mariginal and distal right coronary artery  . ESOPHAGEAL DILATION  2015  . ESOPHAGOGASTRODUODENOSCOPY N/A 07/11/2013   Procedure: ESOPHAGOGASTRODUODENOSCOPY (EGD);  Surgeon: Shirley Friar, MD;  Location: [facility] ENDOSCOPY;  Service: Endoscopy;  Laterality: N/A;  . IMPLANTABLE CARDIOVERTER DEFIBRILLATOR (ICD) GENERATOR CHANGE N/A 07/31/2014   Procedure: ICD GENERATOR CHANGE;  Surgeon: Duke Salvia, MD;  Location: [facility] CATH LAB;  Service: Cardiovascular;  Laterality: N/A;  . LEAD REVISION N/A 07/31/2014   Procedure: LEAD REVISION;  Surgeon: Duke Salvia, MD;  Location: [facility] CATH LAB;  Service: Cardiovascular;  Laterality: N/A;  . PERIPHERAL VASCULAR CATHETERIZATION N/A 05/21/2015   Procedure: Carotid PTA/Stent Intervention;  Surgeon: Sherren Kerns, MD;  Location: [facility] INVASIVE CV LAB;  Service: Cardiovascular;  Laterality: N/A;  . SAVORY DILATION N/A 07/11/2013   Procedure: SAVORY DILATION;  Surgeon: Shirley Friar, MD;  Location[facility] ENDOSCOPY;  Service: Endoscopy;   Laterality: N/A;  . TONSILLECTOMY      Current Outpatient Medications  Medication Sig Dispense Refill  . allopurinol (ZYLOPRIM) 100 MG tablet TAKE 1 TABLET BY MOUTH  DAILY 90 tablet 3  . aspirin EC 81 MG tablet Take 81 mg by mouth daily.    . clopidogrel (PLAVIX) 75 MG tablet Take 1 tablet (75 mg total) by mouth daily. 90 tablet 3  . famotidine (PEPCID) 20 MG tablet TAKE 1 TABLET BY MOUTH  DAILY 90 tablet 3  . furosemide (LASIX) 20 MG tablet TAKE 1 TABLET BY MOUTH 3  TIMES WEEKLY ON TUESDAY,  THURSDAY, AND SATURDAY 45 tablet 3  . levothyroxine (SYNTHROID, LEVOTHROID) 125 MCG tablet Take 1 tablet (125 mcg total) by mouth daily. 90 tablet 3  . oxybutynin (DITROPAN-XL) 10 MG 24 hr tablet Take 10 mg by mouth daily.    . simvastatin (ZOCOR) 40 MG tablet Take 1 tablet (40 mg total) by mouth at bedtime. 90 tablet 3  . tamsulosin (FLOMAX) 0.4 MG CAPS capsule Take 0.4 mg by mouth at bedtime.    . traMADol (ULTRAM) 50 MG tablet Take 50 mg by mouth every 6 (six) hours as needed for moderate pain (right knee).     No current facility-administered medications for this visit.     Allergies  Allergen Reactions  . Ace Inhibitors Other (See Comments)    Contributed to hyperkalemia and renal insufficiency in the past (see office note 10/03/2012).      Review of Systems negative except from HPI and PMH  Physical Exam BP 120/70   Pulse 85   Ht 5\' 4"  (1.626 m)   Wt 137 lb (62.1 kg)   SpO2 97%   BMI 23.52 kg/m  Well developed and nourished in no acute distress HENT normal Neck supple with JVP-flat Clear Regular rate and rhythm, no murmurs or gallops Abd-soft with active BS No Clubbing cyanosis edema Skin-warm and dry A & Oriented  Grossly normal sensory and motor function  ECG demonstrates ventricular pacing at 60 Assessment and  Plan  Ischemic cardiomyopathy  VT NS  Ace Intolerance--  On hydralazine nitrates  Implantable defibrillator-Medtronic- VVIR deficit  Complete heart  block  Without symptoms of ischemia  Activity levels are improved, hence, we will not undertake device upgrade and leave him VVIR.

## 2018-01-30 NOTE — Patient Instructions (Addendum)
Medication Instructions:  Your physician recommends that you continue on your current medications as directed. Please refer to the Current Medication list given to you today.  Labwork: None ordered.  Testing/Procedures: None ordered.  Follow-Up: Your physician recommends that you schedule a follow-up appointment in:   July 2019 with [name]  October 2019 with [name]   Remote monitoring is used to monitor your ICD from home. This monitoring reduces the number of office visits required to check your device to one time per year. It allows [place] to keep an eye on the functioning of your device to ensure it is working properly. You are scheduled for a device check from home on 03/21/2018. You may send your transmission at any time that day. If you have a wireless device, the transmission will be sent automatically. After your physician reviews your transmission, you will receive a postcard with your next transmission date.   Any Other Special Instructions Will Be Listed Below (If Applicable).     If you need a refill on your cardiac medications before your next appointment, please call your pharmacy.

## 2018-03-20 ENCOUNTER — Other Ambulatory Visit: Payer: Self-pay | Admitting: Nurse Practitioner

## 2018-03-21 ENCOUNTER — Ambulatory Visit (INDEPENDENT_AMBULATORY_CARE_PROVIDER_SITE_OTHER): Payer: Medicare Other | Admitting: *Deleted

## 2018-03-21 DIAGNOSIS — I5022 Chronic systolic (congestive) heart failure: Secondary | ICD-10-CM

## 2018-03-21 DIAGNOSIS — I255 Ischemic cardiomyopathy: Secondary | ICD-10-CM

## 2018-03-21 NOTE — Progress Notes (Signed)
Remote ICD transmission.   

## 2018-04-27 LAB — CUP PACEART REMOTE DEVICE CHECK
Battery Remaining Longevity: 96 mo
Battery Voltage: 2.97 V
Brady Statistic RV Percent Paced: 99.75 %
HighPow Impedance: 61 Ohm
Implantable Lead Location: 753860
Lead Channel Impedance Value: 285 Ohm
Lead Channel Impedance Value: 342 Ohm
Lead Channel Setting Pacing Amplitude: 2.5 V
Lead Channel Setting Sensing Sensitivity: 0.3 mV
MDC IDC LEAD IMPLANT DT: 20151014
MDC IDC MSMT LEADCHNL RV PACING THRESHOLD AMPLITUDE: 1 V
MDC IDC MSMT LEADCHNL RV PACING THRESHOLD PULSEWIDTH: 0.4 ms
MDC IDC MSMT LEADCHNL RV SENSING INTR AMPL: 3.5 mV
MDC IDC MSMT LEADCHNL RV SENSING INTR AMPL: 3.5 mV
MDC IDC PG IMPLANT DT: 20151014
MDC IDC SESS DTM: 20190604073525
MDC IDC SET LEADCHNL RV PACING PULSEWIDTH: 0.4 ms

## 2018-05-03 ENCOUNTER — Encounter: Payer: Self-pay | Admitting: Nurse Practitioner

## 2018-05-03 ENCOUNTER — Ambulatory Visit (INDEPENDENT_AMBULATORY_CARE_PROVIDER_SITE_OTHER): Payer: Medicare Other | Admitting: Nurse Practitioner

## 2018-05-03 VITALS — BP 110/70 | HR 73 | Ht 64.0 in | Wt 131.8 lb

## 2018-05-03 DIAGNOSIS — Z79899 Other long term (current) drug therapy: Secondary | ICD-10-CM | POA: Diagnosis not present

## 2018-05-03 DIAGNOSIS — I255 Ischemic cardiomyopathy: Secondary | ICD-10-CM | POA: Diagnosis not present

## 2018-05-03 MED ORDER — SIMVASTATIN 40 MG PO TABS: 40.0000 mg | ORAL_TABLET | Freq: Every day | ORAL | 3 refills | Status: DC

## 2018-05-03 MED ORDER — FUROSEMIDE 20 MG PO TABS: ORAL_TABLET | ORAL | 3 refills | Status: DC

## 2018-05-03 MED ORDER — FAMOTIDINE 20 MG PO TABS: 20.0000 mg | ORAL_TABLET | Freq: Every day | ORAL | 3 refills | Status: DC

## 2018-05-03 MED ORDER — CLOPIDOGREL BISULFATE 75 MG PO TABS: 75.0000 mg | ORAL_TABLET | Freq: Every day | ORAL | 3 refills | Status: DC

## 2018-05-03 MED ORDER — LEVOTHYROXINE SODIUM 125 MCG PO TABS: 125.0000 ug | ORAL_TABLET | Freq: Every day | ORAL | 3 refills | Status: DC

## 2018-05-03 MED ORDER — ALLOPURINOL 100 MG PO TABS: 100.0000 mg | ORAL_TABLET | Freq: Every day | ORAL | 3 refills | Status: DC

## 2018-05-03 NOTE — Progress Notes (Signed)
CARDIOLOGY OFFICE NOTE  Date:  05/03/2018    Adam Gillespie Date of Birth: November 04, 1955 Medical Record #[id number]  PCP:  [name], NP  Cardiologist:  Margarito Courser    Chief Complaint  Patient presents with  . Cardiomyopathy  . Coronary Artery Disease    Follow up visit - seen for [name]    History of Present Illness: Adam Gillespie is a 62 y.o. male who presents today for a follow up visit. This is a 4 month check. Seen for [name].   He has ahx of CAD, status post CABG in 2001, ischemic cardiomyopathy with an EF of 25%, status post AICD, hypothyroidism, HL, GERD, CKD, gout, hypothyroidism. 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 He has a tendency towards hyperkalemia and is not on an ACE inhibitor. Myoview 07/2014 low risk. His last echo was in 2013 - EF of 25%. Chronic LBBB.   Seen back in March of 2016 by [name], PA - had progressive carotid disease on the right and was referred to [facility]. Now s/p stenting of the right carotid. He is on aspirin and Brilinta. Procedure was complicated by hypotension. Most of his cardiac medicines were stopped but we have since been able to get him titrated back on up.   Seen several times last year in 2017and he was doing ok. Mom hadbeen placed in SNF and subsequently died. Brother hadpassed. Niece is his POA and helps in his care.  He was admitted back in [facility] 2018with acute metabolic encephalopathy - unclear etiology but then sounds like he was treated for presumed right knee infection.  Blood cultures negative.Not able to have MRI due to his ICD.  Negative CT of the head noted.   Since then he has done ok - he has declined physically. BP has been low and we have had to adjust down his medicines. Echo with EF of 20%. I saw him back in January - he was in CHB - changed to VVIR - saw [name] afterwards - decision was made to NOT upgrade his device due to poor functional status. He is not  a DNR but we have had discussions about comfort care.   Comes back today. He is here with his niece who is his power of attorney as well as his health care POA. He is ok. Very little activity. Sits and watches TV most of the day. She did however see him trying to weedeat out in the heat with his walker.  Does not seem to have chest pain. No short of breath. He did have difficulty voiding and saw urology - had to have his urethra stretched. Very hard of hearing - refuses to get hearing aids. She reiterates that there is no plan to upgrade his device and that the goal is comfort care. She is not really sure about how to go about the DNR but she is the POA - and he is clearly not able to make decisions regardless if he could hear or not.  Past Medical History:  Diagnosis Date  . 4098 lead 05/01/2014  . ACE inhibitor intolerance    Renal insufficiency/hyperkalemia  . Anoxic encephalopathy (HCC)    POSTOP FROM 2001  . Automatic implantable cardiac defibrillator -Medtronic 04/23/2011   a. 07/2014 gen change -  Medtronic EVERA single chamber ICD, serial number  [id number] H.  . Carotid stenosis, right    60-79% right, 40 - 59% left - needs dopplers in  June 2014  . CHF (congestive heart failure) (HCC)   . Coronary artery disease   . Gout   . Hearing deficit    Bilateral ears  . Heart murmur   . Hyperlipidemia   . Hypothyroidism   . Ischemic cardiomyopathy    Remote MI with CABG x 5 in 2001; Myocardial perfusion imaging EF 26% old infarct anterior wall. 04/2010  . LV dysfunction    EF is 25% per echo in July 2013  . 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 Sleep apnea 11-2014    Past Surgical History:  Procedure Laterality Date  . ADENOIDECTOMY    . BALLOON DILATION N/A 07/11/2013   Procedure: BALLOON DILATION;  Surgeon: Shirley Friar, MD;  Location: [facility] ENDOSCOPY;  Service: Endoscopy;  Laterality: N/A;  . CARDIAC CATHETERIZATION  08/05/2000   THERE IS ANTERIOR AND APICAL  AKINESIS. EF IS ESTIMATED 20%, WITH INFEROBASILAR PORTIONS CONTRACTING REASONABLY WELL.  Marland Kitchen CARDIAC DEFIBRILLATOR PLACEMENT    . COLONOSCOPY WITH PROPOFOL N/A 01/09/2013   Procedure: COLONOSCOPY WITH PROPOFOL;  Surgeon: Shirley Friar, MD;  Location: [facility] ENDOSCOPY;  Service: Endoscopy;  Laterality: N/A;  . CORONARY ARTERY BYPASS GRAFT     x5. lima to the lad, saphenous vein graft to the intermediate and obtuse mariginal, and a saphenous vein graft to the acute mariginal and distal right coronary artery  . ESOPHAGEAL DILATION  2015  . ESOPHAGOGASTRODUODENOSCOPY N/A 07/11/2013   Procedure: ESOPHAGOGASTRODUODENOSCOPY (EGD);  Surgeon: Shirley Friar, MD;  Location: [facility] ENDOSCOPY;  Service: Endoscopy;  Laterality: N/A;  . IMPLANTABLE CARDIOVERTER DEFIBRILLATOR (ICD) GENERATOR CHANGE N/A 07/31/2014   Procedure: ICD GENERATOR CHANGE;  Surgeon: Duke Salvia, MD;  Location: [facility] CATH LAB;  Service: Cardiovascular;  Laterality: N/A;  . LEAD REVISION N/A 07/31/2014   Procedure: LEAD REVISION;  Surgeon: Duke Salvia, MD;  Location: [facility] CATH LAB;  Service: Cardiovascular;  Laterality: N/A;  . PERIPHERAL VASCULAR CATHETERIZATION N/A 05/21/2015   Procedure: Carotid PTA/Stent Intervention;  Surgeon: Sherren Kerns, MD;  Location: [facility] INVASIVE CV LAB;  Service: Cardiovascular;  Laterality: N/A;  . SAVORY DILATION N/A 07/11/2013   Procedure: SAVORY DILATION;  Surgeon: Shirley Friar, MD;  Location: [facility] ENDOSCOPY;  Service: Endoscopy;  Laterality: N/A;  . TONSILLECTOMY       Medications: Current Meds  Medication Sig  . allopurinol (ZYLOPRIM) 100 MG tablet Take 1 tablet (100 mg total) by mouth daily.  Marland Kitchen aspirin EC 81 MG tablet Take 81 mg by mouth daily.  . clopidogrel (PLAVIX) 75 MG tablet Take 1 tablet (75 mg total) by mouth daily.  . famotidine (PEPCID) 20 MG tablet Take 1 tablet (20 mg total) by mouth daily.  . furosemide (LASIX) 20 MG tablet TAKE 1 TABLET BY MOUTH 3  TIMES WEEKLY ON TUESDAY,   THURSDAY, AND SATURDAY  . levothyroxine (SYNTHROID, LEVOTHROID) 125 MCG tablet Take 1 tablet (125 mcg total) by mouth daily.  . simvastatin (ZOCOR) 40 MG tablet Take 1 tablet (40 mg total) by mouth at bedtime.  . traMADol (ULTRAM) 50 MG tablet Take 50 mg by mouth every 6 (six) hours as needed for moderate pain (right knee).  . [DISCONTINUED] allopurinol (ZYLOPRIM) 100 MG tablet TAKE 1 TABLET BY MOUTH  DAILY  . [DISCONTINUED] clopidogrel (PLAVIX) 75 MG tablet Take 1 tablet (75 mg total) by mouth daily.  . [DISCONTINUED] famotidine (PEPCID) 20 MG tablet TAKE 1 TABLET BY MOUTH  DAILY  . [DISCONTINUED] furosemide (LASIX) 20 MG tablet TAKE  1 TABLET BY MOUTH 3  TIMES WEEKLY ON TUESDAY,  THURSDAY, AND SATURDAY  . [DISCONTINUED] levothyroxine (SYNTHROID, LEVOTHROID) 125 MCG tablet Take 1 tablet (125 mcg total) by mouth daily.  . [DISCONTINUED] simvastatin (ZOCOR) 40 MG tablet Take 1 tablet (40 mg total) by mouth at bedtime.     Allergies: Allergies  Allergen Reactions  . Ace Inhibitors Other (See Comments)    Contributed to hyperkalemia and renal insufficiency in the past (see office note 10/03/2012).      Social History: The patient  reports that he has never smoked. His smokeless tobacco use includes chew. He reports that he does not drink alcohol or use drugs.   Family History: The patient's family history includes Asthma in his sister; Cancer in his brother and father; Emphysema in his father and sister; Heart attack in his maternal grandfather and mother; Stroke in his maternal grandmother.   Review of Systems: Please see the history of present illness.   Otherwise, the review of systems is positive for none.   All other systems are reviewed and negative.   Physical Exam: VS:  BP 110/70 (BP Location: Left Arm, Patient Position: Sitting, Cuff Size: Normal)   Pulse 73   Ht 5\' 4"  (1.626 m)   Wt 131 lb 12.8 oz (59.8 kg)   SpO2 93% Comment: at rest  BMI 22.62 kg/m  .  BMI Body mass index  is 22.62 kg/m.  Wt Readings from Last 3 Encounters:  05/03/18 131 lb 12.8 oz (59.8 kg)  01/30/18 137 lb (62.1 kg)  10/20/17 139 lb (63 kg)    General: Pleasant. He is quite hard of hearing. Looks much older than his stated age. He is alert and in no acute distress.   HEENT: Normal.  Neck: Supple, no JVD, carotid bruits, or masses noted.  Cardiac: Regular rate and rhythm. No murmurs, rubs, or gallops. No edema.  Respiratory:  Lungs are clear to auscultation bilaterally with normal work of breathing.  GI: Soft and nontender.  MS: No deformity or atrophy. Gait and ROM intact. Using a cane today.  Skin: Warm and dry. Color is normal.  Neuro:  Strength and sensation are intact and no gross focal deficits noted.  Psych: Alert, appropriate and with normal affect.   LABORATORY DATA:  EKG:  EKG is not ordered today.  Lab Results  Component Value Date   WBC 9.5 10/17/2017   HGB 14.6 10/17/2017   HCT 44.5 10/17/2017   PLT 394 (H) 10/17/2017   GLUCOSE 94 10/20/2017   CHOL 124 10/17/2017   TRIG 123 10/17/2017   HDL 42 10/17/2017   LDLDIRECT 87.5 06/03/2014   LDLCALC 57 10/17/2017   ALT 14 10/17/2017   AST 12 10/17/2017   NA 140 10/20/2017   K 4.7 10/20/2017   CL 106 10/20/2017   CREATININE 1.31 (H) 10/20/2017   BUN 22 10/20/2017   CO2 16 (L) 10/20/2017   TSH 1.250 10/17/2017   INR 1.0 10/17/2017   HGBA1C 5.8 (H) 02/15/2013     BNP (last 3 results) No results for input(s): BNP in the last 8760 hours.  ProBNP (last 3 results) No results for input(s): PROBNP in the last 8760 hours.   Other Studies Reviewed Today:  EchoStudy Conclusions6/2018  - Left ventricle: The cavity size was normal. Systolic function was severely reduced. The estimated ejection fraction was in the range of 20% to 25%. Diffuse hypokinesis. Doppler parameters are consistent with abnormal left ventricular relaxation (grade 1 diastolic dysfunction). -  Ventricular septum: Septal motion  showed abnormal function and dyssynergy. - Tricuspid valve: There was trivial regurgitation. - Pulmonary arteries: PA peak pressure: 35 mm Hg (S).  Impressions:  - Compared to the prior study, there has been no significant interval change.  Carotid duplex 01/2017 suggests a patent right ICA stent with no restenosis within the stent.  Distal (post stent) right ICA stenosis of 40-59%. Distal left ICA stenosis of 40-59%. Distal left CCA stenosis of >50% which extends into the ECA.  Increased distal ICA stenosis bilaterally compared to previous study. Left distal CCA disease.   Myoview 07/2014 Low risk stress nuclear study .  There is evidence of a previous anteriorlateral MI that is old. No ischemia LV Ejection Fraction: 29%. LV Wall Motion: akinesis / severe hypokinesis of the anterior lateral wall.    Assessment/Plan:  1. Underlying ICD - no shocks noted.   2. CHB - now VVIR pacing - he has seen EP - decision to not upgrade his device has been made.   3. CAD - no active symptoms. Goal is conservative management.   4. Ischemic CM - NYHA II/III - seems to be holding his own but with general decline. Not a candidate for ACE/ARB due to hyperkalemia.   5. Chronic systolic HF - checking labs today. Weight is down. No swelling or overt CHF symptoms. He is on less of an ideal regimen due to side effects of low BP and lethargy.   6. CKD - lab today.   7. HLD - lab today.   6. Carotid stenosis, bilateral-S/p arch and right carotid angiogram with right carotid angioplasty and stenting - he is on chronic Plavix. Previously followed by [facility].  We will follow his dopplers study going forward per family request - will need repeat in April of 2019. We will need to get this updated.   Current medicines are reviewed with the patient today.  The patient does not have concerns regarding medicines other than what has been noted above.  The following changes have been made:   See above.  Labs/ tests ordered today include:    Orders Placed This Encounter  Procedures  . Basic metabolic panel  . CBC  . Hepatic function panel  . Lipid panel  . TSH     Disposition:   FU with me in 4 months.   Patient is agreeable to this plan and will call if any problems develop in the interim.   SignedNorma Fredrickson, NP  05/03/2018 11:11 AM  [facility] [address] [place], [place]  [address] Phone: [phone] Fax: [phone]

## 2018-05-03 NOTE — Patient Instructions (Addendum)
We will be checking the following labs today - BMET, CBC, HPF, Lipids and TSH   Medication Instructions:    Continue with your current medicines.     Testing/Procedures To Be Arranged:  N/A  Follow-Up:   See me in 6 months    Other Special Instructions:   N/A    If you need a refill on your cardiac medications before your next appointment, please call your pharmacy.   Call the [facility] office at [phone] if you have any questions, problems or concerns.      

## 2018-05-04 LAB — CBC
Hematocrit: 46.4 % (ref 37.5–51.0)
Hemoglobin: 15.9 g/dL (ref 13.0–17.7)
MCH: 33.7 pg — ABNORMAL HIGH (ref 26.6–33.0)
MCHC: 34.3 g/dL (ref 31.5–35.7)
MCV: 98 fL — ABNORMAL HIGH (ref 79–97)
Platelets: 394 10*3/uL (ref 150–450)
RBC: 4.72 x10E6/uL (ref 4.14–5.80)
RDW: 16.6 % — ABNORMAL HIGH (ref 12.3–15.4)
WBC: 9.3 10*3/uL (ref 3.4–10.8)

## 2018-05-04 LAB — BASIC METABOLIC PANEL
BUN/Creatinine Ratio: 18 (ref 10–24)
BUN: 28 mg/dL — ABNORMAL HIGH (ref 8–27)
CO2: 20 mmol/L (ref 20–29)
Calcium: 9.7 mg/dL (ref 8.6–10.2)
Chloride: 102 mmol/L (ref 96–106)
Creatinine, Ser: 1.56 mg/dL — ABNORMAL HIGH (ref 0.76–1.27)
GFR calc Af Amer: 55 mL/min/{1.73_m2} — ABNORMAL LOW (ref >59)
GFR calc non Af Amer: 48 mL/min/{1.73_m2} — ABNORMAL LOW (ref >59)
Glucose: 80 mg/dL (ref 65–99)
Potassium: 5.3 mmol/L — ABNORMAL HIGH (ref 3.5–5.2)
Sodium: 139 mmol/L (ref 134–144)

## 2018-05-04 LAB — LIPID PANEL
Chol/HDL Ratio: 2.6 ratio (ref 0.0–5.0)
Cholesterol, Total: 139 mg/dL (ref 100–199)
HDL: 53 mg/dL (ref >39)
LDL Calculated: 64 mg/dL (ref 0–99)
Triglycerides: 110 mg/dL (ref 0–149)
VLDL Cholesterol Cal: 22 mg/dL (ref 5–40)

## 2018-05-04 LAB — HEPATIC FUNCTION PANEL
ALT: 10 IU/L (ref 0–44)
AST: 14 IU/L (ref 0–40)
Albumin: 4.2 g/dL (ref 3.6–4.8)
Alkaline Phosphatase: 69 IU/L (ref 39–117)
Bilirubin Total: 0.5 mg/dL (ref 0.0–1.2)
Bilirubin, Direct: 0.13 mg/dL (ref 0.00–0.40)
Total Protein: 6.5 g/dL (ref 6.0–8.5)

## 2018-05-04 LAB — TSH: TSH: 1.17 u[IU]/mL (ref 0.450–4.500)

## 2018-05-05 ENCOUNTER — Other Ambulatory Visit: Payer: Self-pay | Admitting: *Deleted

## 2018-05-05 DIAGNOSIS — R0989 Other specified symptoms and signs involving the circulatory and respiratory systems: Secondary | ICD-10-CM

## 2018-05-10 ENCOUNTER — Ambulatory Visit (HOSPITAL_COMMUNITY): Admission: RE | Admit: 2018-05-10 | Payer: Medicare Other | Source: Ambulatory Visit

## 2018-05-17 ENCOUNTER — Encounter (HOSPITAL_COMMUNITY): Payer: Self-pay | Admitting: Nurse Practitioner

## 2018-06-20 ENCOUNTER — Ambulatory Visit (INDEPENDENT_AMBULATORY_CARE_PROVIDER_SITE_OTHER): Payer: Medicare Other | Admitting: *Deleted

## 2018-06-20 DIAGNOSIS — I255 Ischemic cardiomyopathy: Secondary | ICD-10-CM | POA: Diagnosis not present

## 2018-06-20 DIAGNOSIS — I5022 Chronic systolic (congestive) heart failure: Secondary | ICD-10-CM

## 2018-06-20 NOTE — Progress Notes (Signed)
Remote ICD transmission.   

## 2018-06-23 NOTE — Progress Notes (Signed)
Letter  

## 2018-07-12 LAB — CUP PACEART REMOTE DEVICE CHECK
Brady Statistic RV Percent Paced: 99.65 %
HighPow Impedance: 54 Ohm
Implantable Lead Location: 753860
Lead Channel Impedance Value: 380 Ohm
Lead Channel Sensing Intrinsic Amplitude: 3.5 mV
Lead Channel Setting Pacing Amplitude: 2.5 V
Lead Channel Setting Pacing Pulse Width: 0.4 ms
Lead Channel Setting Sensing Sensitivity: 0.3 mV
MDC IDC LEAD IMPLANT DT: 20151014
MDC IDC MSMT BATTERY REMAINING LONGEVITY: 92 mo
MDC IDC MSMT BATTERY VOLTAGE: 2.97 V
MDC IDC MSMT LEADCHNL RV IMPEDANCE VALUE: 323 Ohm
MDC IDC MSMT LEADCHNL RV PACING THRESHOLD AMPLITUDE: 1 V
MDC IDC MSMT LEADCHNL RV PACING THRESHOLD PULSEWIDTH: 0.4 ms
MDC IDC MSMT LEADCHNL RV SENSING INTR AMPL: 3.5 mV
MDC IDC PG IMPLANT DT: 20151014
MDC IDC SESS DTM: 20190903052505

## 2018-09-19 ENCOUNTER — Ambulatory Visit (INDEPENDENT_AMBULATORY_CARE_PROVIDER_SITE_OTHER): Payer: Medicare Other

## 2018-09-19 DIAGNOSIS — I255 Ischemic cardiomyopathy: Secondary | ICD-10-CM | POA: Diagnosis not present

## 2018-09-19 NOTE — Progress Notes (Signed)
Remote ICD transmission.   

## 2018-09-26 ENCOUNTER — Encounter: Payer: Self-pay | Admitting: Cardiology

## 2018-10-06 ENCOUNTER — Encounter: Payer: Self-pay | Admitting: Nurse Practitioner

## 2018-10-18 LAB — CUP PACEART REMOTE DEVICE CHECK
Battery Remaining Longevity: 84 mo
Battery Voltage: 2.96 V
Brady Statistic RV Percent Paced: 99.73 %
Date Time Interrogation Session: 20191203062204
HighPow Impedance: 58 Ohm
Implantable Lead Location: 753860
Lead Channel Impedance Value: 342 Ohm
Lead Channel Pacing Threshold Pulse Width: 0.4 ms
Lead Channel Sensing Intrinsic Amplitude: 3.5 mV
Lead Channel Setting Pacing Amplitude: 2.5 V
Lead Channel Setting Pacing Pulse Width: 0.4 ms
MDC IDC LEAD IMPLANT DT: 20151014
MDC IDC MSMT LEADCHNL RV IMPEDANCE VALUE: 285 Ohm
MDC IDC MSMT LEADCHNL RV PACING THRESHOLD AMPLITUDE: 1 V
MDC IDC MSMT LEADCHNL RV SENSING INTR AMPL: 3.5 mV
MDC IDC PG IMPLANT DT: 20151014
MDC IDC SET LEADCHNL RV SENSING SENSITIVITY: 0.3 mV

## 2018-10-18 IMAGING — CT CT HEAD W/O CM
3 of 4 series · 16 of 47 positions shown, 19 images · non-contrast
Comparison: None.

CLINICAL DATA: Altered mental status, fatigue, disorientation

EXAM:
CT HEAD WITHOUT CONTRAST
TECHNIQUE: Contiguous axial images were obtained from the base of the skull
through the vertex without intravenous contrast.

[Series 2: head w/o · axial · non-contrast · 0.44mm/px · z∈[+1648,+1778]mm · 10 of 32 slices shown, 13 images]
[im 3/32  brain]
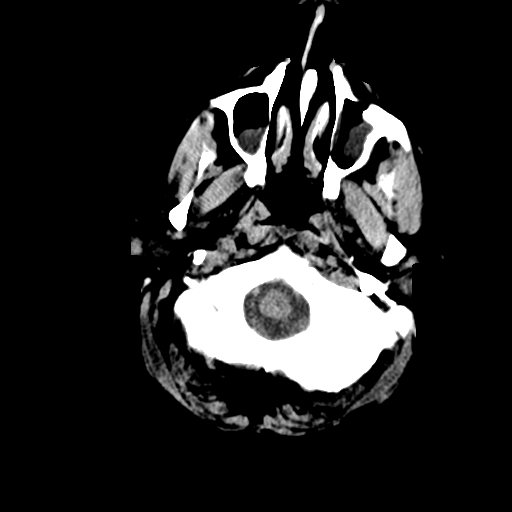
[im 3/32  bone]
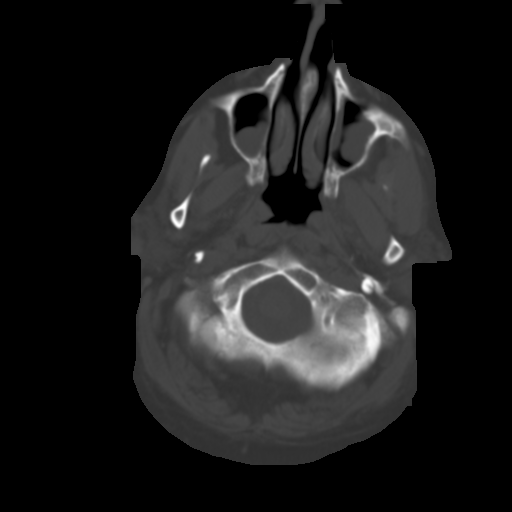
[im 5/32  brain]
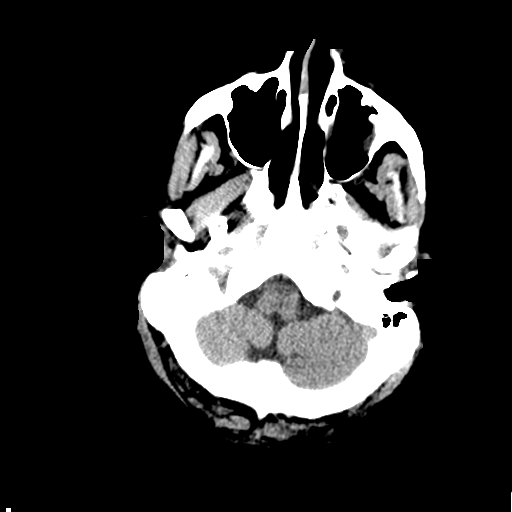
[im 9/32  brain]
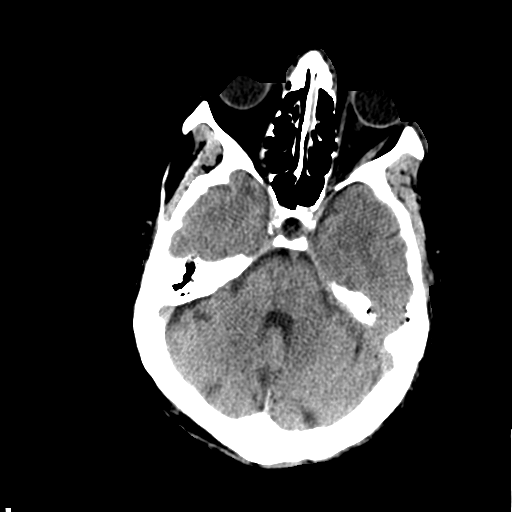
[im 12/32  brain]
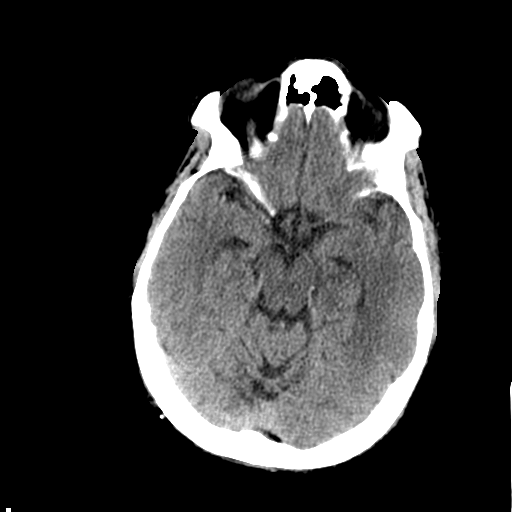
[im 14/32  brain]
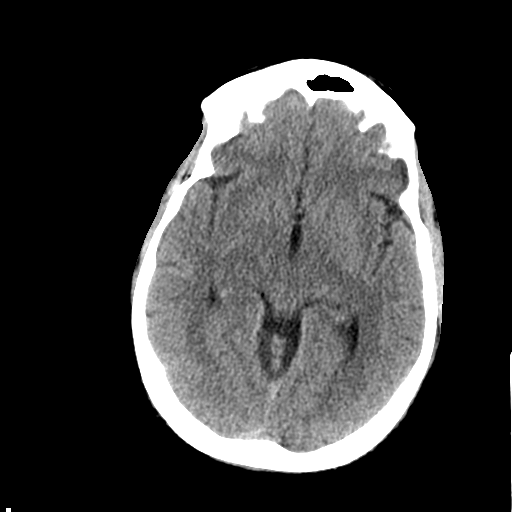
[im 14/32  bone]
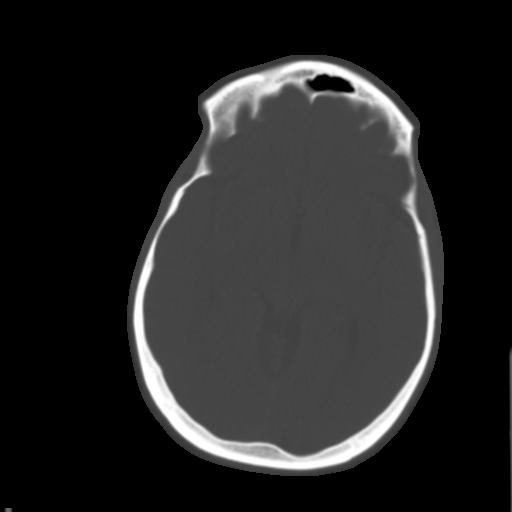
[im 18/32  brain]
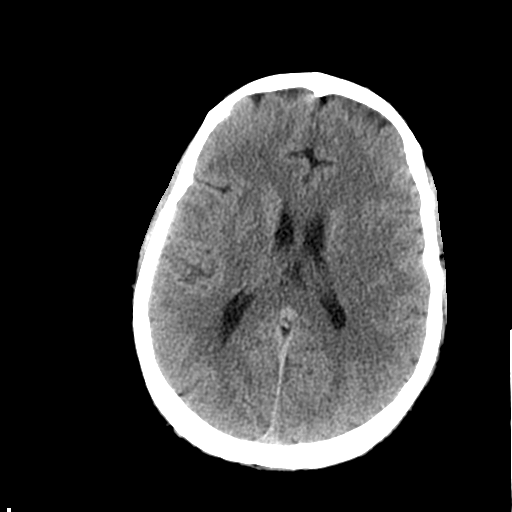
[im 20/32  brain]
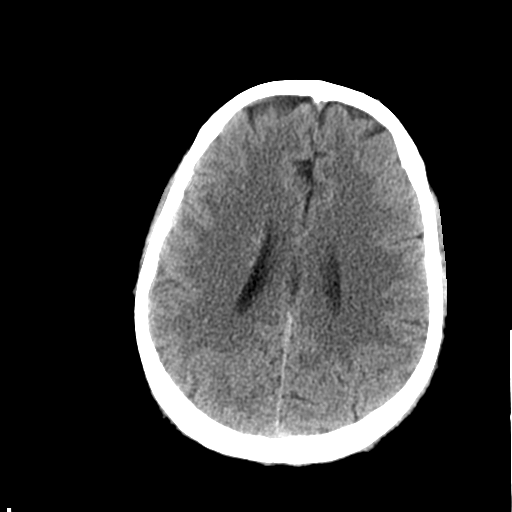
[im 23/32  brain]
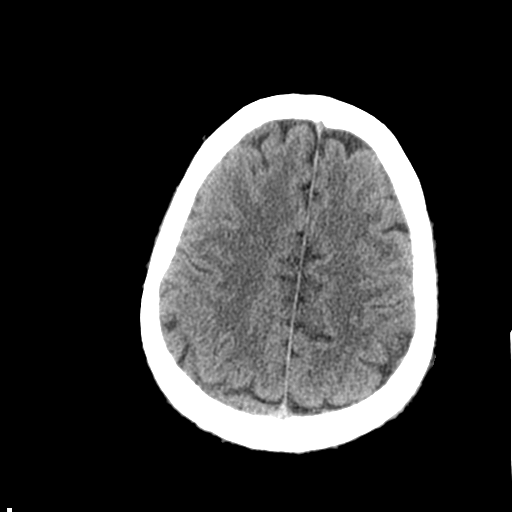
[im 27/32  brain]
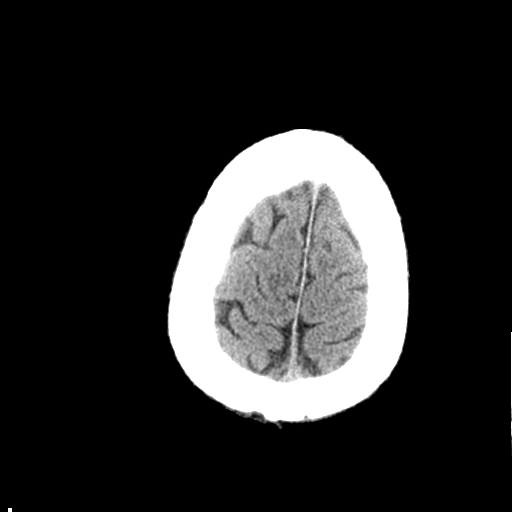
[im 27/32  bone]
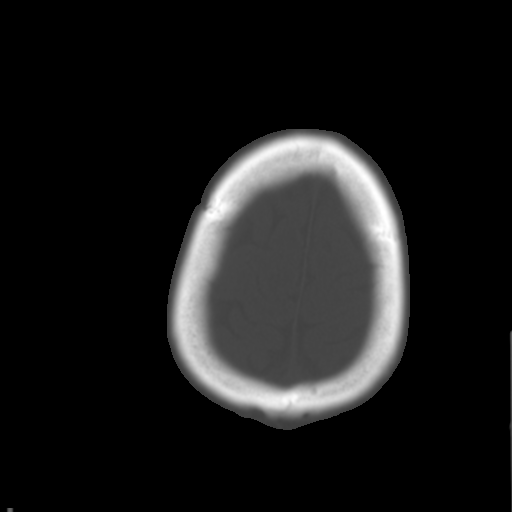
[im 29/32  brain]
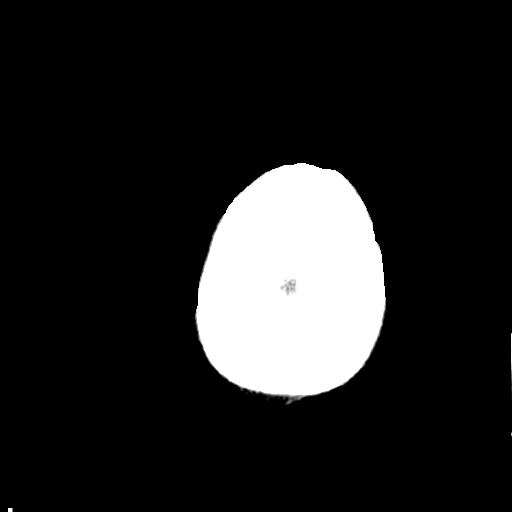

[Series 5: coronal · coronal · 0.32mm/px · 3 of 67 slices shown]
[im 23/67  brain]
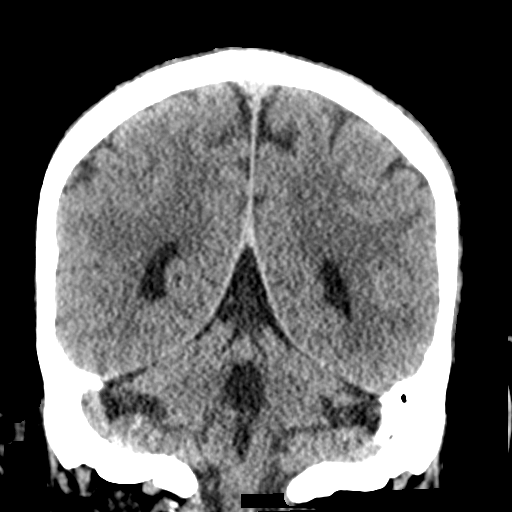
[im 30/67  brain]
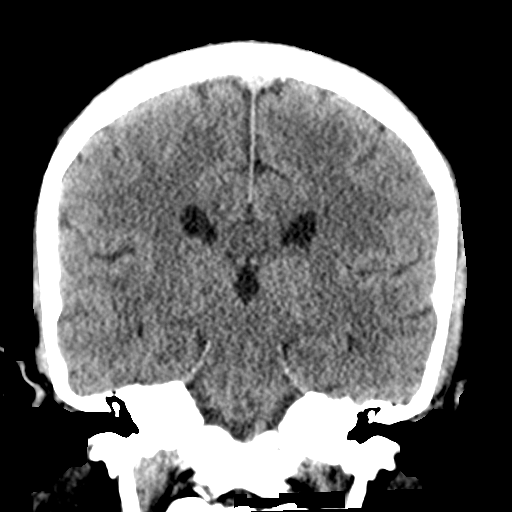
[im 37/67  brain]
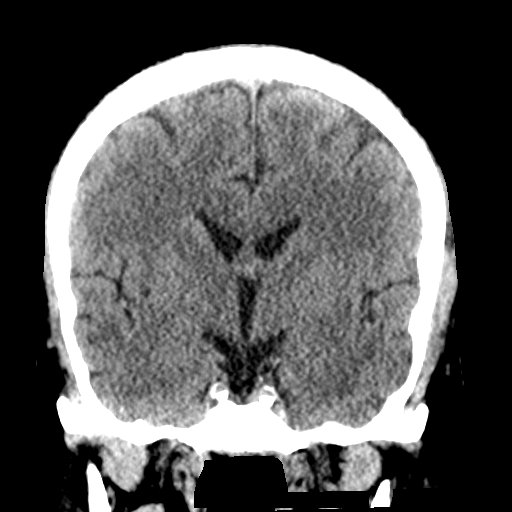

[Series 6: sagittal · sagittal · 0.30mm/px · 3 of 55 slices shown]
[im 19/55  brain]
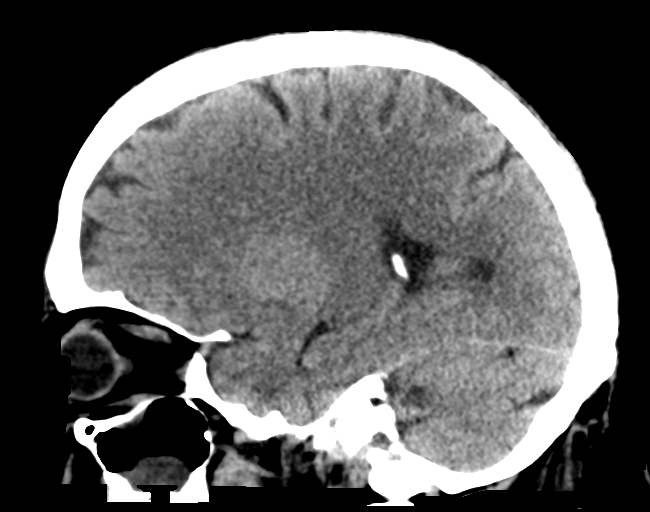
[im 28/55  brain]
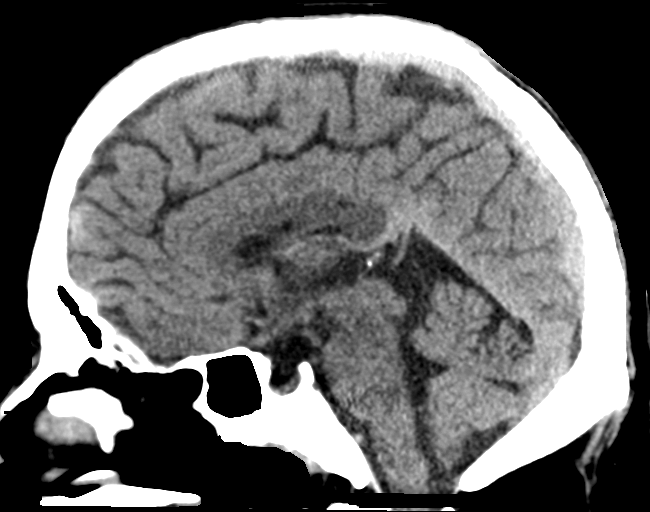
[im 37/55  brain]
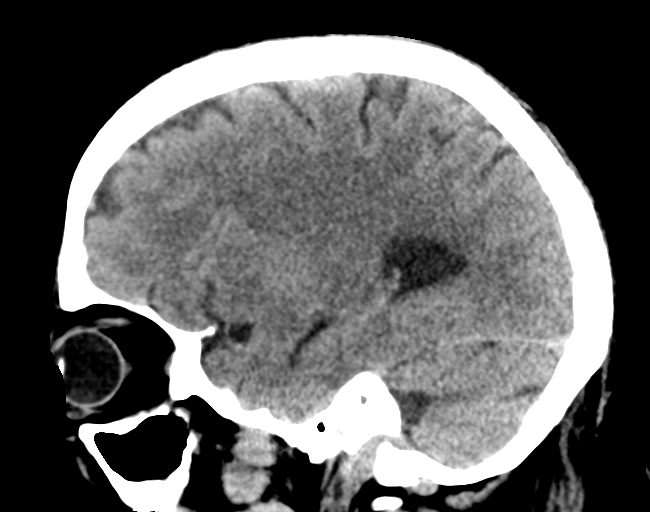

[16 of 47 positions shown; findings below may reference images not displayed]

FINDINGS: Brain: No evidence of acute infarction, hemorrhage, hydrocephalus,
extra-axial collection or mass lesion/mass effect.

Vascular: Mild intracranial atherosclerosis.

Skull: Normal. Negative for fracture or focal lesion.

Sinuses/Orbits: Partial opacification of the bilateral maxillary
sinuses. Mastoid air cells are clear.

Other: None.
IMPRESSION: No evidence of acute intracranial abnormality.

## 2018-11-01 ENCOUNTER — Ambulatory Visit: Payer: Medicare Other | Admitting: Nurse Practitioner

## 2018-11-15 ENCOUNTER — Ambulatory Visit: Payer: Medicare Other | Admitting: Nurse Practitioner

## 2018-11-15 ENCOUNTER — Encounter: Payer: Self-pay | Admitting: Nurse Practitioner

## 2018-11-15 VITALS — BP 100/70 | HR 89 | Ht 64.0 in | Wt 133.1 lb

## 2018-11-15 DIAGNOSIS — Z79899 Other long term (current) drug therapy: Secondary | ICD-10-CM

## 2018-11-15 DIAGNOSIS — I255 Ischemic cardiomyopathy: Secondary | ICD-10-CM

## 2018-11-15 DIAGNOSIS — I5022 Chronic systolic (congestive) heart failure: Secondary | ICD-10-CM | POA: Diagnosis not present

## 2018-11-15 LAB — BASIC METABOLIC PANEL
BUN/Creatinine Ratio: 16 (ref 10–24)
BUN: 25 mg/dL (ref 8–27)
CO2: 20 mmol/L (ref 20–29)
Calcium: 9.5 mg/dL (ref 8.6–10.2)
Chloride: 103 mmol/L (ref 96–106)
Creatinine, Ser: 1.58 mg/dL — ABNORMAL HIGH (ref 0.76–1.27)
GFR calc Af Amer: 54 mL/min/{1.73_m2} — ABNORMAL LOW (ref >59)
GFR calc non Af Amer: 47 mL/min/{1.73_m2} — ABNORMAL LOW (ref >59)
Glucose: 87 mg/dL (ref 65–99)
Potassium: 5.1 mmol/L (ref 3.5–5.2)
Sodium: 141 mmol/L (ref 134–144)

## 2018-11-15 LAB — CBC
Hematocrit: 44.2 % (ref 37.5–51.0)
Hemoglobin: 14.9 g/dL (ref 13.0–17.7)
MCH: 33.2 pg — ABNORMAL HIGH (ref 26.6–33.0)
MCHC: 33.7 g/dL (ref 31.5–35.7)
MCV: 98 fL — ABNORMAL HIGH (ref 79–97)
Platelets: 333 10*3/uL (ref 150–450)
RBC: 4.49 x10E6/uL (ref 4.14–5.80)
RDW: 15.7 % — ABNORMAL HIGH (ref 11.6–15.4)
WBC: 9.3 10*3/uL (ref 3.4–10.8)

## 2018-11-15 LAB — TSH: TSH: 1.02 u[IU]/mL (ref 0.450–4.500)

## 2018-11-15 MED ORDER — ALLOPURINOL 100 MG PO TABS: 100.0000 mg | ORAL_TABLET | Freq: Every day | ORAL | 3 refills | Status: AC

## 2018-11-15 MED ORDER — CLOPIDOGREL BISULFATE 75 MG PO TABS: 75.0000 mg | ORAL_TABLET | Freq: Every day | ORAL | 3 refills | Status: AC

## 2018-11-15 MED ORDER — FUROSEMIDE 20 MG PO TABS: ORAL_TABLET | ORAL | 3 refills | Status: AC

## 2018-11-15 MED ORDER — LEVOTHYROXINE SODIUM 125 MCG PO TABS: 125.0000 ug | ORAL_TABLET | Freq: Every day | ORAL | 3 refills | Status: AC

## 2018-11-15 NOTE — Patient Instructions (Addendum)
We will be checking the following labs today - BMET, CBC and TSH   Medication Instructions:    Continue with your current medicines. BUT  I am stopping Zocor and Pepcid today  I have refilled your other medicines today.    If you need a refill on your cardiac medications before your next appointment, please call your pharmacy.     Testing/Procedures To Be Arranged:  N/A  Follow-Up:   See me in 6 months    At [facility], you and your health needs are our priority.  As part of our continuing mission to provide you with exceptional heart care, we have created designated Provider Care Teams.  These Care Teams include your primary Cardiologist (physician) and Advanced Practice Providers (APPs -  Physician Assistants and Nurse Practitioners) who all work together to provide you with the care you need, when you need it.  Special Instructions:  . None  Call the [facility] office at [phone] if you have any questions, problems or concerns.

## 2018-11-15 NOTE — Progress Notes (Signed)
CARDIOLOGY OFFICE NOTE  Date:  11/15/2018    Adam Gillespie Date of Birth: Feb 25, 1956 Medical Record #[id number]  PCP:  [name], NP  Cardiologist:  Margarito Courser    Chief Complaint  Patient presents with  . Coronary Artery Disease  . Congestive Heart Failure    Follow up visit - seen for [name]     History of Present Illness: Adam Gillespie is a 63 y.o. male who presents today for a follow up visit. Former patient of Dr. [name]. Seen for [name].   He has ahx of CAD, status post CABG in Mar 01, 2000, ischemic cardiomyopathy with an EF of 25%, status post AICD, hypothyroidism, HL, GERD, CKD, gout, hypothyroidism. 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 He has a tendency towards hyperkalemia and is not on an ACE inhibitor. Myoview 07/2014 low risk. His last echo was in 2012-03-01 - EF of 25%. Chronic LBBB.He had stenting of the R carotid in 03-02-2015. Was followed by [facility] and has transitioned this back to [place]. That procedure was complicated by hypotension and his medicines had to be adjusted.   His niece is his POA - his mom died in 03-01-16 along with his brother.   He was admitted back in [facility] 2018with acute metabolic encephalopathy - unclear etiology but then sounds like he was treated for presumed right knee infection. Blood cultures negative.Not able to have MRIdue to his ICD.Negative CT of the head noted.   General physical decline since - he has developed CHB - device changed to VVIR - decision made to NOT upgrade his device due to poor functional status. He is not a DNR but we have had discussions about comfort care. He is on really no cardiac medicines due to hypotension.   Last seen in July with me - doing ok but very little activity. Niece not really sure about how to go about the DNR process and she is hesitant to make those decisions despite being his POA.   Comes in today. Here with his niece. He has had a fall around Christmas in  the bathroom - lost his balance - no injury. Only showering on Friday's. Very little activity. Balance is getting worse - he will use his walker pretty regularly. He does not wear his life alert too much. Eating good - lots of junk - weight is down a few pounds. He seems to be fairly comfortable. No chest pain. Family would like just supportive/comfort care going forward - no plans to recheck his carotids. Device has about 7 years left.   Past Medical History:  Diagnosis Date  . 2725 lead 05/01/2014  . ACE inhibitor intolerance    Renal insufficiency/hyperkalemia  . Anoxic encephalopathy (HCC)    POSTOP FROM 03-01-2000  . Automatic implantable cardiac defibrillator -Medtronic 04/23/2011   a. 07/2014 gen change -  Medtronic EVERA single chamber ICD, serial number  [id number].  . Carotid stenosis, right    60-79% right, 40 - 59% left - needs dopplers in June 2014  . CHF (congestive heart failure) (HCC)   . Coronary artery disease   . Gout   . Hearing deficit    Bilateral ears  . Heart murmur   . Hyperlipidemia   . Hypothyroidism   . Ischemic cardiomyopathy    Remote MI with CABG x 5 in 2000-03-01; Myocardial perfusion imaging EF 26% old infarct anterior wall. 04/2010  . LV dysfunction    EF is 25% per  echo in July 2013  . 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 Sleep apnea 11-2014    Past Surgical History:  Procedure Laterality Date  . ADENOIDECTOMY    . BALLOON DILATION N/A 07/11/2013   Procedure: BALLOON DILATION;  Surgeon: Shirley Friar, MD;  Location: [facility] ENDOSCOPY;  Service: Endoscopy;  Laterality: N/A;  . CARDIAC CATHETERIZATION  08/05/2000   THERE IS ANTERIOR AND APICAL AKINESIS. EF IS ESTIMATED 20%, WITH INFEROBASILAR PORTIONS CONTRACTING REASONABLY WELL.  Marland Kitchen CARDIAC DEFIBRILLATOR PLACEMENT    . COLONOSCOPY WITH PROPOFOL N/A 01/09/2013   Procedure: COLONOSCOPY WITH PROPOFOL;  Surgeon: Shirley Friar, MD;  Location: [facility] ENDOSCOPY;  Service: Endoscopy;  Laterality: N/A;  .  CORONARY ARTERY BYPASS GRAFT     x5. lima to the lad, saphenous vein graft to the intermediate and obtuse mariginal, and a saphenous vein graft to the acute mariginal and distal right coronary artery  . ESOPHAGEAL DILATION  2015  . ESOPHAGOGASTRODUODENOSCOPY N/A 07/11/2013   Procedure: ESOPHAGOGASTRODUODENOSCOPY (EGD);  Surgeon: Shirley Friar, MD;  Location: [facility] ENDOSCOPY;  Service: Endoscopy;  Laterality: N/A;  . IMPLANTABLE CARDIOVERTER DEFIBRILLATOR (ICD) GENERATOR CHANGE N/A 07/31/2014   Procedure: ICD GENERATOR CHANGE;  Surgeon: Duke Salvia, MD;  Location: [facility] CATH LAB;  Service: Cardiovascular;  Laterality: N/A;  . LEAD REVISION N/A 07/31/2014   Procedure: LEAD REVISION;  Surgeon: Duke Salvia, MD;  Location: [facility] CATH LAB;  Service: Cardiovascular;  Laterality: N/A;  . PERIPHERAL VASCULAR CATHETERIZATION N/A 05/21/2015   Procedure: Carotid PTA/Stent Intervention;  Surgeon: Sherren Kerns, MD;  Location: [facility] INVASIVE CV LAB;  Service: Cardiovascular;  Laterality: N/A;  . SAVORY DILATION N/A 07/11/2013   Procedure: SAVORY DILATION;  Surgeon: Shirley Friar, MD;  Location: [facility] ENDOSCOPY;  Service: Endoscopy;  Laterality: N/A;  . TONSILLECTOMY       Medications: Current Meds  Medication Sig  . allopurinol (ZYLOPRIM) 100 MG tablet Take 1 tablet (100 mg total) by mouth daily.  Marland Kitchen aspirin EC 81 MG tablet Take 81 mg by mouth daily.  . clopidogrel (PLAVIX) 75 MG tablet Take 1 tablet (75 mg total) by mouth daily.  . furosemide (LASIX) 20 MG tablet TAKE 1 TABLET BY MOUTH 3  TIMES WEEKLY ON TUESDAY,  THURSDAY, AND SATURDAY  . levothyroxine (SYNTHROID, LEVOTHROID) 125 MCG tablet Take 1 tablet (125 mcg total) by mouth daily.  . traMADol (ULTRAM) 50 MG tablet Take 50 mg by mouth every 6 (six) hours as needed for moderate pain (right knee).  . [DISCONTINUED] allopurinol (ZYLOPRIM) 100 MG tablet Take 1 tablet (100 mg total) by mouth daily.  . [DISCONTINUED] clopidogrel (PLAVIX) 75 MG tablet  Take 1 tablet (75 mg total) by mouth daily.  . [DISCONTINUED] famotidine (PEPCID) 20 MG tablet Take 1 tablet (20 mg total) by mouth daily.  . [DISCONTINUED] furosemide (LASIX) 20 MG tablet TAKE 1 TABLET BY MOUTH 3  TIMES WEEKLY ON TUESDAY,  THURSDAY, AND SATURDAY  . [DISCONTINUED] levothyroxine (SYNTHROID, LEVOTHROID) 125 MCG tablet Take 1 tablet (125 mcg total) by mouth daily.  . [DISCONTINUED] simvastatin (ZOCOR) 40 MG tablet Take 1 tablet (40 mg total) by mouth at bedtime.     Allergies: Allergies  Allergen Reactions  . Ace Inhibitors Other (See Comments)    Contributed to hyperkalemia and renal insufficiency in the past (see office note 10/03/2012).      Social History: The patient  reports that he has never smoked. His smokeless tobacco use includes chew. He reports that  he does not drink alcohol or use drugs.   Family History: The patient's family history includes Asthma in his sister; Cancer in his brother and father; Emphysema in his father and sister; Heart attack in his maternal grandfather and mother; Stroke in his maternal grandmother.   Review of Systems: Please see the history of present illness.   Otherwise, the review of systems is positive for none.   All other systems are reviewed and negative.   Physical Exam: VS:  BP 100/70 (BP Location: Left Arm, Patient Position: Sitting, Cuff Size: Normal)   Pulse 89   Ht 5\' 4"  (1.626 m)   Wt 133 lb 1.9 oz (60.4 kg)   SpO2 98% Comment: at rest  BMI 22.85 kg/m  .  BMI Body mass index is 22.85 kg/m.  Wt Readings from Last 3 Encounters:  11/15/18 133 lb 1.9 oz (60.4 kg)  05/03/18 131 lb 12.8 oz (59.8 kg)  01/30/18 137 lb (62.1 kg)    General: He looks older than his stated age. He is alert but very hard of hearing. Answers simple questions. Seems to be in no acute distress.   HEENT: Normal.  Neck: Supple, no JVD, carotid bruits, or masses noted.  Cardiac: Regular rate and rhythm. No murmurs, rubs, or gallops. No edema.    Respiratory:  Lungs are clear to auscultation bilaterally with normal work of breathing.  GI: Soft and nontender.  MS: No deformity or atrophy. Gait and ROM intact. Using a walker.  Skin: Warm and dry. Color is sallow.  Neuro:  Strength and sensation are intact and no gross focal deficits noted.  Psych: Alert, appropriate and with normal affect.   LABORATORY DATA:  EKG:  EKG is not ordered today.  Lab Results  Component Value Date   WBC 9.3 05/03/2018   HGB 15.9 05/03/2018   HCT 46.4 05/03/2018   PLT 394 05/03/2018   GLUCOSE 80 05/03/2018   CHOL 139 05/03/2018   TRIG 110 05/03/2018   HDL 53 05/03/2018   LDLDIRECT 87.5 06/03/2014   LDLCALC 64 05/03/2018   ALT 10 05/03/2018   AST 14 05/03/2018   NA 139 05/03/2018   K 5.3 (H) 05/03/2018   CL 102 05/03/2018   CREATININE 1.56 (H) 05/03/2018   BUN 28 (H) 05/03/2018   CO2 20 05/03/2018   TSH 1.170 05/03/2018   INR 1.0 10/17/2017   HGBA1C 5.8 (H) 02/15/2013     BNP (last 3 results) No results for input(s): BNP in the last 8760 hours.  ProBNP (last 3 results) No results for input(s): PROBNP in the last 8760 hours.   Other Studies Reviewed Today:  EchoStudy Conclusions6/2018  - Left ventricle: The cavity size was normal. Systolic function was severely reduced. The estimated ejection fraction was in the range of 20% to 25%.Diffuse hypokinesis. Doppler parameters are consistent with abnormal left ventricular relaxation (grade 1 diastolic dysfunction). - Ventricular septum: Septal motion showed abnormal function and dyssynergy. - Tricuspid valve: There was trivial regurgitation. - Pulmonary arteries: PA peak pressure: 35 mm Hg (S).  Impressions:  - Compared to the prior study, there has been no significant interval change.  Carotid duplex 01/2017 suggests a patent right ICA stent with no restenosis within the stent.  Distal (post stent) right ICA stenosis of 40-59%. Distal left ICA stenosis of  40-59%. Distal left CCA stenosis of >50% which extends into the ECA.  Increased distal ICA stenosis bilaterally compared to previous study. Left distal CCA disease.   Myoview 07/2014  Low risk stress nuclear study .  There is evidence of a previous anteriorlateral MI that is old. No ischemia LV Ejection Fraction: 29%. LV Wall Motion: akinesis / severe hypokinesis of the anterior lateral wall.    Assessment/Plan:  1. Ischemic CM - EF of 25% - he is on no CHF regimen due to hypotension/CKD/hyperkalemia - opting for comfort care. Family does not wish for him to be resuscitated. He is not able to make these decisions due to cognitive dysfunction that has been present for many years - worse with his CABG and progressive over time. He is made a DNR today - I have signed this.   2. Underlying ICD - decided previously to NOT upgrade his device with CHB noted.   3. CHB - now VVIR pacing - he has seen EP - decision to not upgrade his device has been made. Still with long battery life. They will continue with remote checks.   4. CAD - no active symptoms. Goal is conservative/comfort management.   5. Chronic systolic HF- no real symptoms - on very little medicine.   6. HLD - will stop his Zocor today. No need to continue to check lipids.   7. Bilateral carotid disease - prior CEA - they have no plans to recheck his carotids.   8. DNR - form is completed by me today - no expiration - family will place on refrigerator at home.   Current medicines are reviewed with the patient today.  The patient does not have concerns regarding medicines other than what has been noted above.  The following changes have been made:  See above.  Labs/ tests ordered today include:    Orders Placed This Encounter  Procedures  . Basic metabolic panel  . CBC  . TSH     Disposition:   FU with me in 6 months.   Patient is agreeable to this plan and will call if any problems develop in the  interim.   SignedNorma Fredrickson: Devion Chriscoe, NP  11/15/2018 8:46 AM  [facility] [address] [place], [place]  [address] Phone: [phone] Fax: [phone]

## 2018-12-07 ENCOUNTER — Emergency Department (HOSPITAL_COMMUNITY): Payer: Medicare Other

## 2018-12-07 ENCOUNTER — Encounter (HOSPITAL_COMMUNITY): Payer: Self-pay | Admitting: Emergency Medicine

## 2018-12-07 ENCOUNTER — Emergency Department (HOSPITAL_COMMUNITY)
Admission: EM | Admit: 2018-12-07 | Discharge: 2018-12-07 | Disposition: A | Discharge status: Home / Self Care | Payer: Medicare Other | Attending: Emergency Medicine | Admitting: Emergency Medicine

## 2018-12-07 DIAGNOSIS — I509 Heart failure, unspecified: Secondary | ICD-10-CM | POA: Insufficient documentation

## 2018-12-07 DIAGNOSIS — R42 Dizziness and giddiness: Secondary | ICD-10-CM

## 2018-12-07 DIAGNOSIS — F0150 Vascular dementia without behavioral disturbance: Secondary | ICD-10-CM | POA: Diagnosis not present

## 2018-12-07 DIAGNOSIS — I5022 Chronic systolic (congestive) heart failure: Secondary | ICD-10-CM | POA: Diagnosis not present

## 2018-12-07 DIAGNOSIS — E039 Hypothyroidism, unspecified: Secondary | ICD-10-CM | POA: Insufficient documentation

## 2018-12-07 DIAGNOSIS — R55 Syncope and collapse: Secondary | ICD-10-CM

## 2018-12-07 DIAGNOSIS — R079 Chest pain, unspecified: Secondary | ICD-10-CM | POA: Diagnosis not present

## 2018-12-07 LAB — URINALYSIS, ROUTINE W REFLEX MICROSCOPIC
Bilirubin Urine: NEGATIVE
Glucose, UA: NEGATIVE mg/dL
Hgb urine dipstick: NEGATIVE
Ketones, ur: NEGATIVE mg/dL
Leukocytes,Ua: NEGATIVE
Nitrite: NEGATIVE
Protein, ur: NEGATIVE mg/dL
Specific Gravity, Urine: 1.011 (ref 1.005–1.030)
pH: 5 (ref 5.0–8.0)

## 2018-12-07 LAB — BASIC METABOLIC PANEL
Anion gap: 13 (ref 5–15)
BUN: 75 mg/dL — ABNORMAL HIGH (ref 8–23)
CALCIUM: 9.2 mg/dL (ref 8.9–10.3)
CO2: 20 mmol/L — ABNORMAL LOW (ref 22–32)
Chloride: 106 mmol/L (ref 98–111)
Creatinine, Ser: 1.56 mg/dL — ABNORMAL HIGH (ref 0.61–1.24)
GFR calc non Af Amer: 47 mL/min — ABNORMAL LOW (ref >60)
GFR, EST AFRICAN AMERICAN: 55 mL/min — AB (ref >60)
Glucose, Bld: 87 mg/dL (ref 70–99)
Potassium: 4.7 mmol/L (ref 3.5–5.1)
Sodium: 139 mmol/L (ref 135–145)

## 2018-12-07 LAB — CBC
HEMATOCRIT: 34.5 % — AB (ref 39.0–52.0)
Hemoglobin: 11.1 g/dL — ABNORMAL LOW (ref 13.0–17.0)
MCH: 31.3 pg (ref 26.0–34.0)
MCHC: 32.2 g/dL (ref 30.0–36.0)
MCV: 97.2 fL (ref 80.0–100.0)
Platelets: 408 10*3/uL — ABNORMAL HIGH (ref 150–400)
RBC: 3.55 MIL/uL — ABNORMAL LOW (ref 4.22–5.81)
RDW: 15.3 % (ref 11.5–15.5)
WBC: 11.9 10*3/uL — ABNORMAL HIGH (ref 4.0–10.5)
nRBC: 0 % (ref 0.0–0.2)

## 2018-12-07 LAB — I-STAT TROPONIN, ED: Troponin i, poc: 0.01 ng/mL (ref 0.00–0.08)

## 2018-12-07 LAB — BRAIN NATRIURETIC PEPTIDE: B Natriuretic Peptide: 760.5 pg/mL — ABNORMAL HIGH (ref 0.0–100.0)

## 2018-12-07 MED ORDER — ASPIRIN 81 MG PO CHEW
243.0000 mg | CHEWABLE_TABLET | Freq: Once | ORAL | Status: AC
  Administered 2018-12-07: 243 mg via ORAL
  Filled 2018-12-07: qty 3

## 2018-12-07 NOTE — ED Provider Notes (Signed)
[facility] Emergency Department Provider Note MRN:  [id number]  Arrival date & time: 12/07/18     Chief Complaint   Chest Pain   History of Present Illness   Adam Gillespie is a 63 y.o. year-old male with a history of CABG, pacemaker, CHF presenting to the ED with chief complaint of chest pain.  At 1030 this morning, patient walked out to the front yard to get the mail, upon returning began having central pressure-like chest pain, began feeling lightheaded, fell to the ground.  Denies trauma or injuries, denies loss of consciousness, was found on the ground by family members with phone in his hand attempting to call an ambulance.  Currently denying recent fever or cough, no headache or vision change, no shortness of breath, no abdominal pain, possible recent dysuria.  Review of Systems  A complete 10 system review of systems was obtained and all systems are negative except as noted in the HPI and PMH.   Patient's Health History    Past Medical History:  Diagnosis Date  . 6153 lead 05/01/2014  . ACE inhibitor intolerance    Renal insufficiency/hyperkalemia  . Anoxic encephalopathy (HCC)    POSTOP FROM 2001  . Automatic implantable cardiac defibrillator -Medtronic 04/23/2011   a. 07/2014 gen change -  Medtronic EVERA single chamber ICD, serial number  [id number].  . Carotid stenosis, right    60-79% right, 40 - 59% left - needs dopplers in June 2014  . CHF (congestive heart failure) (HCC)   . Coronary artery disease   . Gout   . Hearing deficit    Bilateral ears  . Heart murmur   . Hyperlipidemia   . Hypothyroidism   . Ischemic cardiomyopathy    Remote MI with CABG x 5 in 2001; Myocardial perfusion imaging EF 26% old infarct anterior wall. 04/2010  . LV dysfunction    EF is 25% per echo in July 2013  . 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 Sleep apnea 11-2014    Past Surgical History:  Procedure Laterality Date  . ADENOIDECTOMY    . BALLOON  DILATION N/A 07/11/2013   Procedure: BALLOON DILATION;  Surgeon: Shirley Friar, MD;  Location[facility] ENDOSCOPY;  Service: Endoscopy;  Laterality: N/A;  . CARDIAC CATHETERIZATION  08/05/2000   THERE IS ANTERIOR AND APICAL AKINESIS. EF IS ESTIMATED 20%, WITH INFEROBASILAR PORTIONS CONTRACTING REASONABLY WELL.  Marland Kitchen CARDIAC DEFIBRILLATOR PLACEMENT    . COLONOSCOPY WITH PROPOFOL N/A 01/09/2013   Procedure: COLONOSCOPY WITH PROPOFOL;  Surgeon: Shirley Friar, MD;  Location: [facility] ENDOSCOPY;  Service: Endoscopy;  Laterality: N/A;  . CORONARY ARTERY BYPASS GRAFT     x5. lima to the lad, saphenous vein graft to the intermediate and obtuse mariginal, and a saphenous vein graft to the acute mariginal and distal right coronary artery  . ESOPHAGEAL DILATION  2015  . ESOPHAGOGASTRODUODENOSCOPY N/A 07/11/2013   Procedure: ESOPHAGOGASTRODUODENOSCOPY (EGD);  Surgeon: Shirley Friar, MD;  Location: [facility] ENDOSCOPY;  Service: Endoscopy;  Laterality: N/A;  . IMPLANTABLE CARDIOVERTER DEFIBRILLATOR (ICD) GENERATOR CHANGE N/A 07/31/2014   Procedure: ICD GENERATOR CHANGE;  Surgeon: Duke Salvia, MD;  Location: [facility] CATH LAB;  Service: Cardiovascular;  Laterality: N/A;  . LEAD REVISION N/A 07/31/2014   Procedure: LEAD REVISION;  Surgeon: Duke Salvia, MD;  Location: [facility] CATH LAB;  Service: Cardiovascular;  Laterality: N/A;  . PERIPHERAL VASCULAR CATHETERIZATION N/A 05/21/2015   Procedure: Carotid PTA/Stent Intervention;  Surgeon: Sherren Kerns, MD;  Location: [facility] INVASIVE CV LAB;  Service: Cardiovascular;  Laterality: N/A;  . SAVORY DILATION N/A 07/11/2013   Procedure: SAVORY DILATION;  Surgeon: Shirley Friar, MD;  Location: [facility] ENDOSCOPY;  Service: Endoscopy;  Laterality: N/A;  . TONSILLECTOMY      Family History  Problem Relation Age of Onset  . Heart attack Mother   . Cancer Father   . Emphysema Father   . Cancer Brother   . Asthma Sister   . Emphysema Sister   . Heart attack Maternal Grandfather   .  Stroke Maternal Grandmother     Social History   Socioeconomic History  . Marital status: Single    Spouse name: Not on file  . Number of children: 0  . Years of education: Not on file  . Highest education level: Not on file  Occupational History  . Occupation: retired  Engineer, production  . Financial resource strain: Not on file  . Food insecurity:    Worry: Not on file    Inability: Not on file  . Transportation needs:    Medical: Not on file    Non-medical: Not on file  Tobacco Use  . Smoking status: Never Smoker  . Smokeless tobacco: Current User    Types: Chew  . Tobacco comment: snuff  Substance and Sexual Activity  . Alcohol use: No    Alcohol/week: 0.0 standard drinks  . Drug use: No  . Sexual activity: Never  Lifestyle  . Physical activity:    Days per week: Not on file    Minutes per session: Not on file  . Stress: Not on file  Relationships  . Social connections:    Talks on phone: Not on file    Gets together: Not on file    Attends religious service: Not on file    Active member of club or organization: Not on file    Attends meetings of clubs or organizations: Not on file    Relationship status: Not on file  . Intimate partner violence:    Fear of current or ex partner: Not on file    Emotionally abused: Not on file    Physically abused: Not on file    Forced sexual activity: Not on file  Other Topics Concern  . Not on file  Social History Narrative  . Not on file     Physical Exam  Vital Signs and Nursing Notes reviewed Vitals:   12/07/18 1445 12/07/18 1500  BP: 109/67 (!) 111/58  Pulse: 72 70  Resp: 15 (!) 24  Temp:    SpO2: 100% 99%    CONSTITUTIONAL: Chronically ill-appearing, NAD NEURO:  Alert and oriented x 3, no focal deficits EYES:  eyes equal and reactive ENT/NECK:  no LAD, no JVD CARDIO: Regular rate, well-perfused, normal S1 and S2 PULM:  CTAB no wheezing or rhonchi GI/GU:  normal bowel sounds, non-distended,  non-tender MSK/SPINE:  No gross deformities, no edema SKIN:  no rash, atraumatic PSYCH:  Appropriate speech and behavior  Diagnostic and Interventional Summary    EKG Interpretation  Date/Time:  Thursday December 07 2018 12:06:06 EST Ventricular Rate:  71 PR Interval:    QRS Duration: 173 QT Interval:  428 QTC Calculation: 466 R Axis:   -117 Text Interpretation:  Ventricular-paced rhythm No further analysis attempted due to paced rhythm Confirmed by Kennis Carina [phone]) on 12/07/2018 12:28:07 PM      Labs Reviewed  CBC - Abnormal; Notable for the following components:  Result Value   WBC 11.9 (*)    RBC 3.55 (*)    Hemoglobin 11.1 (*)    HCT 34.5 (*)    Platelets 408 (*)    All other components within normal limits  BASIC METABOLIC PANEL - Abnormal; Notable for the following components:   CO2 20 (*)    BUN 75 (*)    Creatinine, Ser 1.56 (*)    GFR calc non Af Amer 47 (*)    GFR calc Af Amer 55 (*)    All other components within normal limits  BRAIN NATRIURETIC PEPTIDE - Abnormal; Notable for the following components:   B Natriuretic Peptide 760.5 (*)    All other components within normal limits  URINALYSIS, ROUTINE W REFLEX MICROSCOPIC - Abnormal; Notable for the following components:   Color, Urine STRAW (*)    All other components within normal limits  I-STAT TROPONIN, ED    DG Chest Port 1 View  Final Result      Medications  aspirin chewable tablet 243 mg (243 mg Oral Given 12/07/18 1306)     Procedures Critical Care  ED Course and Medical Decision Making  I have reviewed the triage vital signs and the nursing notes.  Pertinent labs & imaging results that were available during my care of the patient were reviewed by me and considered in my medical decision making (see below for details).  Question of chest pain related to stable angina in this 63 year old male, complicated by a presyncopal episode.  Given history of CHF and pacemaker, will  interrogate the pacemaker and likely admit.  Patient is comfortable appearing, not short of breath, not tachypneic, no evidence of DVT, normal O2 saturations, nothing to suggest PE today.  Pacemaker was interrogated with no significant events found today.  Work-up is unrevealing, troponin negative, urinalysis normal, chest x-ray unremarkable.  BNP mildly elevated but patient is in no respiratory distress, making urine, has home Lasix.  Hospitalist was consulted for admission for high risk syncope/presyncope.  Discussions were had with family and patient, who is not interested in any invasive procedures, only really interested in treating infections, which are not present today.  Patient and family deferring admission at this time.  Specifically discussed the benefit of a repeated troponin test to exclude delayed heart damage.  Family expresses understanding of this concept, but again explains that even if it were positive they would not want any treatment.  Family is heavily considering enrolling in hospice next week.  Seems appropriate to discharge per patient wishes.  These discussions had with patient's medical decision maker.  After the discussed management above, the patient was determined to be safe for discharge.  The patient was in agreement with this plan and all questions regarding their care were answered.  ED return precautions were discussed and the patient will return to the ED with any significant worsening of condition.   Elmer Sow. Pilar Plate, MD [facility] [email or web address]  Final Clinical Impressions(s) / ED Diagnoses     ICD-10-CM   1. Chest pain R07.9 DG Chest [facility] 1 View    DG Chest [facility]    ED Discharge Orders    None         Sabas Sous, MD 12/07/18 [phone]

## 2018-12-07 NOTE — ED Notes (Signed)
Xray bedside.

## 2018-12-07 NOTE — ED Notes (Signed)
ED Provider at bedside. IV team bedside attempting to start IV.

## 2018-12-07 NOTE — ED Notes (Signed)
Interrogated pt's medtronic pacemaker

## 2018-12-07 NOTE — ED Triage Notes (Signed)
Pt arrives via EMS complaining of a fall- he was walking to his mailbox and had a fall. Does not recall what caused him to fall. Complains of CP. Pt has pacemaker/defib.  Pt is alert and oriented. BP 132/86, HR V  paced 78, 99%, CBG 157

## 2018-12-07 NOTE — Discharge Instructions (Addendum)
You were evaluated in the Emergency Department and after careful evaluation, we did not find any emergent condition requiring admission or further testing in the hospital.  Your urine sample and chest x-ray did not show any signs of infection.  Please return to the Emergency Department if you experience any worsening of your condition.  We encourage you to follow up with a primary care provider.  Thank you for allowing [place] to be a part of your care.

## 2018-12-07 NOTE — ED Notes (Signed)
Pt's sister reports that pt told her- he came inside from walking to the mailbox, was walking with walker and fell to the ground. His chest was hurting prior to falling. Pt crawled to the phone to call family and for help.

## 2018-12-07 NOTE — ED Notes (Signed)
Medtronic representative called this RN and states no shocks were delivered today from pt's device.

## 2018-12-07 NOTE — Progress Notes (Signed)
PROGRESS NOTE    Adam Gillespie  SCB:[id number] DOB: Jan 31, 1956 DOA: 12/07/2018 PCP: [name], NP    Brief Narrative:   Patient is 63 year old male with history of coronary artery disease status post CABG, chronic systolic heart failure with known ejection fraction of 20% status post AICD, cognitive dysfunction and intolerance to cardiac medications.  He was recently been decided to difficult to treat and taken off most of the medications.  Patient is poor historian.  He just smiling and not offering any complaints.  He is hungry and eating sandwich.  Patient has vascular dementia.  Accompanied by patient's niece who is his power of attorney for healthcare and sister at bedside.  They have been helping him at home to stay comfortable.  At about 10:00 this morning, patient walked out to the front yard to get the mail, he uses walker, on returning back home, he felt like chest pressure sensation and felt lightheaded and fell to the ground.  No trauma or injury.  No syncopal episodes.  In the emergency room, he is hemodynamically stable.  Hemoglobin is stable.  Creatinine 1.56.  Twelve-lead EKG is paced rhythm no ischemic changes.  Pacemaker was interrogated and had no events.  Chest x-ray is without evidence of infection.  Point-of-care troponin is normal.  Assessment & Plan:   Active Problems:   * No active hospital problems. *   #1 presyncopal episode in a patient with severe systolic heart failure, has AICD and currently medically stable #2 chest pain: Negative initial EKG and troponin. #3 dementia cognitive dysfunction #4 DNR/DNI and do not wish to have any investigations.  Had detailed discussion with patient's healthcare power of attorney at bedside.  Sister was also present at the bedside.  Patient is currently comfortable, he does not have any acute cardiac events ongoing.  Patient's family wishes no hospitalization, they wanted to make sure he does not have any infections.   Even if patient is having any cardiac events or heart attack, they wish not to be treated.  At this point, patient is fairly stable and as per patient and family wishes he can be discharged home. I advised her to call hospice of [facility] so they can help them at home or have evaluation at home for home hospice. Patient has adequate home support, do not wish any further treatment. Discussed with ER physician.  Patient can safely be discharged home at this time.   DVT prophylaxis: None. Code Status: DNR/DNI. Family Communication: Healthcare power of attorney patient's niece at the bedside, sister at the bedside. Disposition Plan: Going home from ER.   Consultants:   None.  Procedures:   None.  Antimicrobials:   None.   Subjective: See above per HPI.  Patient himself denies any complaints.  Objective: Vitals:   12/07/18 1215 12/07/18 1245 12/07/18 1330 12/07/18 1445  BP: 112/72 112/60 (!) 98/58 109/67  Pulse: 70  69 72  Resp: (!) 22 (!) 24 20 15   Temp:      TempSrc:      SpO2: 100%  100% 100%  Weight:      Height:       No intake or output data in the 24 hours ending 12/07/18 1459 Filed Weights   12/07/18 1207  Weight: 63.5 kg    Examination:  General exam: Appears calm and comfortable  He is eating his lunch. Patient is alert and awake but not oriented. Respiratory system: Clear to auscultation. Respiratory effort normal. Cardiovascular system: S1 &  S2 heard, RRR. No JVD, murmurs, rubs, gallops or clicks. No pedal edema.  AICD in place. Gastrointestinal system: Abdomen is nondistended, soft and nontender. No organomegaly or masses felt. Normal bowel sounds heard. Central nervous system: Alert and awake.  Not oriented  No focal neurological deficits. Extremities: Symmetric 5 x 5 power. Skin: No rashes, lesions or ulcers Psychiatry: Judgement and insight appear normal. Mood & affect appropriate.     Data Reviewed: I have personally reviewed following labs and  imaging studies  CBC: Recent Labs  Lab 12/07/18 1238  WBC 11.9*  HGB 11.1*  HCT 34.5*  MCV 97.2  PLT 408*   Basic Metabolic Panel: Recent Labs  Lab 12/07/18 1238  NA 139  K 4.7  CL 106  CO2 20*  GLUCOSE 87  BUN 75*  CREATININE 1.56*  CALCIUM 9.2   GFR: Estimated Creatinine Clearance: 41.6 mL/min (A) (by C-G formula based on SCr of 1.56 mg/dL (H)). Liver Function Tests: No results for input(s): AST, ALT, ALKPHOS, BILITOT, PROT, ALBUMIN in the last 168 hours. No results for input(s): LIPASE, AMYLASE in the last 168 hours. No results for input(s): AMMONIA in the last 168 hours. Coagulation Profile: No results for input(s): INR, PROTIME in the last 168 hours. Cardiac Enzymes: No results for input(s): CKTOTAL, CKMB, CKMBINDEX, TROPONINI in the last 168 hours. BNP (last 3 results) No results for input(s): PROBNP in the last 8760 hours. HbA1C: No results for input(s): HGBA1C in the last 72 hours. CBG: No results for input(s): GLUCAP in the last 168 hours. Lipid Profile: No results for input(s): CHOL, HDL, LDLCALC, TRIG, CHOLHDL, LDLDIRECT in the last 72 hours. Thyroid Function Tests: No results for input(s): TSH, T4TOTAL, FREET4, T3FREE, THYROIDAB in the last 72 hours. Anemia Panel: No results for input(s): VITAMINB12, FOLATE, FERRITIN, TIBC, IRON, RETICCTPCT in the last 72 hours. Sepsis Labs: No results for input(s): PROCALCITON, LATICACIDVEN in the last 168 hours.  No results found for this or any previous visit (from the past 240 hour(s)).       Radiology Studies: Dg Chest Port 1 View  Result Date: 12/07/2018 CLINICAL DATA:  Chest pain. EXAM: PORTABLE CHEST 1 VIEW COMPARISON:  Radiographs of December 28, 2016. FINDINGS: Stable cardiomegaly. Status post coronary bypass graft. Left-sided pacemaker is unchanged in position. No pneumothorax or pleural effusion is noted. Both lungs are clear. The visualized skeletal structures are unremarkable. IMPRESSION: No active  disease. Electronically Signed   By: Lupita Raider, M.D.   On: 12/07/2018 12:56        Scheduled Meds: Continuous Infusions:   LOS: 0 days    Time spent: 25 minutes.    Dorcas Carrow, MD [facility] Pager [phone]  If 7PM-7AM, please contact night-coverage [email or web address] Password [facility] 12/07/2018, 2:59 PM

## 2018-12-14 ENCOUNTER — Telehealth: Payer: Self-pay

## 2018-12-14 NOTE — Telephone Encounter (Signed)
Pt niece called the pt passed away 15-Dec-2018. The funeral home needs someone to come and turn off pt ICD. The name of the [address] and [address] [place]. The niece phone number is [phone] and the funeral home phone number [phone]. Pt niece also wanted [name] to know he passed away.

## 2018-12-17 DEATH — deceased

## 2019-05-08 ENCOUNTER — Ambulatory Visit: Payer: Medicare Other | Admitting: Nurse Practitioner
